# Patient Record
Sex: Female | Born: 1935
Health system: Southern US, Community
[De-identification: ages and names within clinical notes are randomized; demographics above are authoritative.]

## PROBLEM LIST (undated history)

## (undated) DIAGNOSIS — E079 Disorder of thyroid, unspecified: Secondary | ICD-10-CM

## (undated) DIAGNOSIS — Z87891 Personal history of nicotine dependence: Secondary | ICD-10-CM

## (undated) DIAGNOSIS — E7800 Pure hypercholesterolemia, unspecified: Secondary | ICD-10-CM

## (undated) DIAGNOSIS — J449 Chronic obstructive pulmonary disease, unspecified: Secondary | ICD-10-CM

## (undated) DIAGNOSIS — K449 Diaphragmatic hernia without obstruction or gangrene: Secondary | ICD-10-CM

## (undated) DIAGNOSIS — I10 Essential (primary) hypertension: Secondary | ICD-10-CM

## (undated) DIAGNOSIS — J45909 Unspecified asthma, uncomplicated: Secondary | ICD-10-CM

## (undated) DIAGNOSIS — K219 Gastro-esophageal reflux disease without esophagitis: Secondary | ICD-10-CM

## (undated) DIAGNOSIS — M1990 Unspecified osteoarthritis, unspecified site: Secondary | ICD-10-CM

## (undated) HISTORY — DX: Essential (primary) hypertension: I10

## (undated) HISTORY — DX: Diaphragmatic hernia without obstruction or gangrene: K44.9

## (undated) HISTORY — DX: Pure hypercholesterolemia, unspecified: E78.00

## (undated) HISTORY — PX: ABDOMINAL HYSTERECTOMY: SHX81

## (undated) HISTORY — DX: Chronic obstructive pulmonary disease, unspecified: J44.9

## (undated) HISTORY — DX: Personal history of nicotine dependence: Z87.891

## (undated) HISTORY — PX: CHOLECYSTECTOMY: SHX55

## (undated) HISTORY — DX: Unspecified osteoarthritis, unspecified site: M19.90

## (undated) HISTORY — DX: Disorder of thyroid, unspecified: E07.9

---

## 2000-04-30 ENCOUNTER — Encounter: Payer: Self-pay | Admitting: Family Medicine

## 2000-04-30 ENCOUNTER — Encounter: Admission: RE | Admit: 2000-04-30 | Discharge: 2000-04-30 | Payer: Self-pay | Admitting: Family Medicine

## 2001-06-24 HISTORY — PX: COLONOSCOPY: SHX174

## 2002-06-11 ENCOUNTER — Ambulatory Visit (HOSPITAL_COMMUNITY): Admission: RE | Admit: 2002-06-11 | Discharge: 2002-06-11 | Payer: Self-pay | Admitting: Gastroenterology

## 2002-06-11 ENCOUNTER — Encounter: Payer: Self-pay | Admitting: Gastroenterology

## 2002-07-16 ENCOUNTER — Encounter (INDEPENDENT_AMBULATORY_CARE_PROVIDER_SITE_OTHER): Payer: Self-pay | Admitting: Specialist

## 2002-07-16 ENCOUNTER — Encounter: Payer: Self-pay | Admitting: Surgery

## 2002-07-16 ENCOUNTER — Observation Stay (HOSPITAL_COMMUNITY): Admission: RE | Admit: 2002-07-16 | Discharge: 2002-07-17 | Payer: Self-pay | Admitting: Surgery

## 2002-09-17 ENCOUNTER — Ambulatory Visit (HOSPITAL_COMMUNITY): Admission: RE | Admit: 2002-09-17 | Discharge: 2002-09-17 | Payer: Self-pay | Admitting: Gastroenterology

## 2006-04-11 ENCOUNTER — Ambulatory Visit: Payer: Self-pay | Admitting: Family Medicine

## 2007-02-16 ENCOUNTER — Ambulatory Visit: Payer: Self-pay | Admitting: Family Medicine

## 2007-06-09 ENCOUNTER — Ambulatory Visit: Payer: Self-pay | Admitting: Family Medicine

## 2007-06-24 ENCOUNTER — Ambulatory Visit: Payer: Self-pay | Admitting: Family Medicine

## 2007-07-15 ENCOUNTER — Ambulatory Visit: Payer: Self-pay | Admitting: Family Medicine

## 2007-08-10 ENCOUNTER — Ambulatory Visit: Payer: Self-pay | Admitting: Family Medicine

## 2007-08-13 ENCOUNTER — Ambulatory Visit: Payer: Self-pay | Admitting: Family Medicine

## 2007-11-26 ENCOUNTER — Ambulatory Visit: Payer: Self-pay | Admitting: Family Medicine

## 2007-12-10 ENCOUNTER — Ambulatory Visit: Payer: Self-pay | Admitting: Family Medicine

## 2007-12-14 LAB — HM MAMMOGRAPHY: HM Mammogram: NEGATIVE

## 2008-01-25 ENCOUNTER — Ambulatory Visit: Payer: Self-pay | Admitting: Family Medicine

## 2008-03-01 ENCOUNTER — Ambulatory Visit: Payer: Self-pay | Admitting: Family Medicine

## 2008-11-29 ENCOUNTER — Ambulatory Visit: Payer: Self-pay | Admitting: Family Medicine

## 2009-01-31 ENCOUNTER — Ambulatory Visit: Payer: Self-pay | Admitting: Family Medicine

## 2009-11-30 ENCOUNTER — Ambulatory Visit: Payer: Self-pay | Admitting: Family Medicine

## 2010-02-15 ENCOUNTER — Ambulatory Visit: Payer: Self-pay | Admitting: Family Medicine

## 2010-03-07 ENCOUNTER — Ambulatory Visit: Payer: Self-pay | Admitting: Family Medicine

## 2010-03-28 ENCOUNTER — Ambulatory Visit: Payer: Self-pay | Admitting: Family Medicine

## 2010-06-21 ENCOUNTER — Inpatient Hospital Stay (HOSPITAL_COMMUNITY)
Admission: EM | Admit: 2010-06-21 | Discharge: 2010-06-29 | Payer: Self-pay | Source: Home / Self Care | Attending: Internal Medicine | Admitting: Internal Medicine

## 2010-06-22 ENCOUNTER — Encounter (INDEPENDENT_AMBULATORY_CARE_PROVIDER_SITE_OTHER): Payer: Self-pay | Admitting: Internal Medicine

## 2010-06-27 LAB — GLUCOSE, CAPILLARY
Glucose-Capillary: 98 mg/dL (ref 70–99)
Glucose-Capillary: 112 mg/dL — ABNORMAL HIGH (ref 70–99)
Glucose-Capillary: 107 mg/dL — ABNORMAL HIGH (ref 70–99)
Glucose-Capillary: 124 mg/dL — ABNORMAL HIGH (ref 70–99)

## 2010-06-27 LAB — CBC
HCT: 30.3 % — ABNORMAL LOW (ref 36.0–46.0)
Hemoglobin: 10.1 g/dL — ABNORMAL LOW (ref 12.0–15.0)
MCH: 29.8 pg (ref 26.0–34.0)
MCHC: 33.3 g/dL (ref 30.0–36.0)
MCV: 89.4 fL (ref 78.0–100.0)
Platelets: 518 10*3/uL — ABNORMAL HIGH (ref 150–400)
RBC: 3.39 MIL/uL — ABNORMAL LOW (ref 3.87–5.11)
RDW: 14.6 % (ref 11.5–15.5)
WBC: 11.5 10*3/uL — ABNORMAL HIGH (ref 4.0–10.5)

## 2010-06-27 LAB — MAGNESIUM: Magnesium: 1.5 mg/dL (ref 1.5–2.5)

## 2010-06-27 LAB — BASIC METABOLIC PANEL
BUN: 8 mg/dL (ref 6–23)
CO2: 25 mEq/L (ref 19–32)
Calcium: 7.3 mg/dL — ABNORMAL LOW (ref 8.4–10.5)
Chloride: 107 mEq/L (ref 96–112)
Creatinine, Ser: 0.63 mg/dL (ref 0.4–1.2)
GFR calc Af Amer: >60 mL/min (ref >60)
GFR calc non Af Amer: >60 mL/min (ref >60)
Glucose, Bld: 89 mg/dL (ref 70–99)
Potassium: 2.8 mEq/L — ABNORMAL LOW (ref 3.5–5.1)
Sodium: 140 mEq/L (ref 135–145)

## 2010-06-28 LAB — BASIC METABOLIC PANEL
BUN: 7 mg/dL (ref 6–23)
CO2: 29 mEq/L (ref 19–32)
Calcium: 8.5 mg/dL (ref 8.4–10.5)
Chloride: 104 mEq/L (ref 96–112)
Creatinine, Ser: 0.68 mg/dL (ref 0.4–1.2)
GFR calc Af Amer: >60 mL/min (ref >60)
GFR calc non Af Amer: >60 mL/min (ref >60)
Glucose, Bld: 97 mg/dL (ref 70–99)
Potassium: 4.1 mEq/L (ref 3.5–5.1)
Sodium: 139 mEq/L (ref 135–145)

## 2010-06-28 LAB — CBC
HCT: 29 % — ABNORMAL LOW (ref 36.0–46.0)
Hemoglobin: 9.6 g/dL — ABNORMAL LOW (ref 12.0–15.0)
MCH: 29.9 pg (ref 26.0–34.0)
MCHC: 33.1 g/dL (ref 30.0–36.0)
MCV: 90.3 fL (ref 78.0–100.0)
Platelets: 531 10*3/uL — ABNORMAL HIGH (ref 150–400)
RBC: 3.21 MIL/uL — ABNORMAL LOW (ref 3.87–5.11)
RDW: 14.4 % (ref 11.5–15.5)
WBC: 8.6 10*3/uL (ref 4.0–10.5)

## 2010-06-28 LAB — MAGNESIUM: Magnesium: 2.1 mg/dL (ref 1.5–2.5)

## 2010-06-28 LAB — GLUCOSE, CAPILLARY
Glucose-Capillary: 109 mg/dL — ABNORMAL HIGH (ref 70–99)
Glucose-Capillary: 125 mg/dL — ABNORMAL HIGH (ref 70–99)
Glucose-Capillary: 86 mg/dL (ref 70–99)
Glucose-Capillary: 107 mg/dL — ABNORMAL HIGH (ref 70–99)

## 2010-06-29 LAB — GLUCOSE, CAPILLARY
Glucose-Capillary: 88 mg/dL (ref 70–99)
Glucose-Capillary: 125 mg/dL — ABNORMAL HIGH (ref 70–99)

## 2010-07-04 ENCOUNTER — Ambulatory Visit
Admission: RE | Admit: 2010-07-04 | Discharge: 2010-07-04 | Payer: Self-pay | Source: Home / Self Care | Attending: Family Medicine | Admitting: Family Medicine

## 2010-07-09 LAB — GLUCOSE, CAPILLARY: Glucose-Capillary: 108 mg/dL — ABNORMAL HIGH (ref 70–99)

## 2010-07-10 ENCOUNTER — Ambulatory Visit
Admission: RE | Admit: 2010-07-10 | Discharge: 2010-07-10 | Payer: Self-pay | Source: Home / Self Care | Attending: Family Medicine | Admitting: Family Medicine

## 2010-07-19 LAB — CLOSTRIDIUM DIFFICILE BY PCR: Toxigenic C. Difficile by PCR: NEGATIVE

## 2010-07-20 ENCOUNTER — Ambulatory Visit
Admission: RE | Admit: 2010-07-20 | Discharge: 2010-07-20 | Payer: Self-pay | Source: Home / Self Care | Attending: Family Medicine | Admitting: Family Medicine

## 2010-09-03 LAB — COMPREHENSIVE METABOLIC PANEL
ALT: 18 U/L (ref 0–35)
ALT: 19 U/L (ref 0–35)
AST: 27 U/L (ref 0–37)
AST: 28 U/L (ref 0–37)
Albumin: 2 g/dL — ABNORMAL LOW (ref 3.5–5.2)
Albumin: 2.6 g/dL — ABNORMAL LOW (ref 3.5–5.2)
Alkaline Phosphatase: 62 U/L (ref 39–117)
BUN: 12 mg/dL (ref 6–23)
BUN: 36 mg/dL — ABNORMAL HIGH (ref 6–23)
BUN: 83 mg/dL — ABNORMAL HIGH (ref 6–23)
CO2: 18 mEq/L — ABNORMAL LOW (ref 19–32)
Calcium: 8.2 mg/dL — ABNORMAL LOW (ref 8.4–10.5)
Calcium: 8.5 mg/dL (ref 8.4–10.5)
Chloride: 108 mEq/L (ref 96–112)
Chloride: 110 mEq/L (ref 96–112)
Creatinine, Ser: 0.78 mg/dL (ref 0.4–1.2)
GFR calc Af Amer: 17 mL/min — ABNORMAL LOW (ref >60)
GFR calc non Af Amer: 14 mL/min — ABNORMAL LOW (ref >60)
GFR calc non Af Amer: 35 mL/min — ABNORMAL LOW (ref >60)
Glucose, Bld: 110 mg/dL — ABNORMAL HIGH (ref 70–99)
Glucose, Bld: 127 mg/dL — ABNORMAL HIGH (ref 70–99)
Potassium: 3.4 mEq/L — ABNORMAL LOW (ref 3.5–5.1)
Potassium: 3.7 mEq/L (ref 3.5–5.1)
Sodium: 139 mEq/L (ref 135–145)
Total Bilirubin: 0.5 mg/dL (ref 0.3–1.2)
Total Bilirubin: 0.6 mg/dL (ref 0.3–1.2)
Total Protein: 5.8 g/dL — ABNORMAL LOW (ref 6.0–8.3)
Total Protein: 7.1 g/dL (ref 6.0–8.3)

## 2010-09-03 LAB — GLUCOSE, CAPILLARY
Glucose-Capillary: 100 mg/dL — ABNORMAL HIGH (ref 70–99)
Glucose-Capillary: 101 mg/dL — ABNORMAL HIGH (ref 70–99)
Glucose-Capillary: 102 mg/dL — ABNORMAL HIGH (ref 70–99)
Glucose-Capillary: 107 mg/dL — ABNORMAL HIGH (ref 70–99)
Glucose-Capillary: 111 mg/dL — ABNORMAL HIGH (ref 70–99)
Glucose-Capillary: 111 mg/dL — ABNORMAL HIGH (ref 70–99)
Glucose-Capillary: 114 mg/dL — ABNORMAL HIGH (ref 70–99)
Glucose-Capillary: 116 mg/dL — ABNORMAL HIGH (ref 70–99)
Glucose-Capillary: 119 mg/dL — ABNORMAL HIGH (ref 70–99)
Glucose-Capillary: 121 mg/dL — ABNORMAL HIGH (ref 70–99)
Glucose-Capillary: 128 mg/dL — ABNORMAL HIGH (ref 70–99)
Glucose-Capillary: 137 mg/dL — ABNORMAL HIGH (ref 70–99)
Glucose-Capillary: 144 mg/dL — ABNORMAL HIGH (ref 70–99)
Glucose-Capillary: 152 mg/dL — ABNORMAL HIGH (ref 70–99)
Glucose-Capillary: 177 mg/dL — ABNORMAL HIGH (ref 70–99)

## 2010-09-03 LAB — CARBOXYHEMOGLOBIN
Methemoglobin: 1.1 % (ref 0.0–1.5)
Total hemoglobin: 11.1 g/dL — ABNORMAL LOW (ref 12.5–16.0)

## 2010-09-03 LAB — CBC
HCT: 27.7 % — ABNORMAL LOW (ref 36.0–46.0)
HCT: 33 % — ABNORMAL LOW (ref 36.0–46.0)
Hemoglobin: 10.3 g/dL — ABNORMAL LOW (ref 12.0–15.0)
Hemoglobin: 10.3 g/dL — ABNORMAL LOW (ref 12.0–15.0)
Hemoglobin: 10.8 g/dL — ABNORMAL LOW (ref 12.0–15.0)
MCH: 30.1 pg (ref 26.0–34.0)
MCH: 30.5 pg (ref 26.0–34.0)
MCH: 30.6 pg (ref 26.0–34.0)
MCH: 30.8 pg (ref 26.0–34.0)
MCHC: 34.7 g/dL (ref 30.0–36.0)
MCHC: 34.7 g/dL (ref 30.0–36.0)
MCHC: 34.8 g/dL (ref 30.0–36.0)
MCV: 86.8 fL (ref 78.0–100.0)
MCV: 88.2 fL (ref 78.0–100.0)
MCV: 88.9 fL (ref 78.0–100.0)
Platelets: 362 10*3/uL (ref 150–400)
Platelets: 388 10*3/uL (ref 150–400)
RBC: 3.38 MIL/uL — ABNORMAL LOW (ref 3.87–5.11)
RBC: 3.39 MIL/uL — ABNORMAL LOW (ref 3.87–5.11)
RBC: 3.51 MIL/uL — ABNORMAL LOW (ref 3.87–5.11)
RDW: 14.2 % (ref 11.5–15.5)
RDW: 14.6 % (ref 11.5–15.5)
RDW: 14.9 % (ref 11.5–15.5)
WBC: 15.2 10*3/uL — ABNORMAL HIGH (ref 4.0–10.5)
WBC: 17 10*3/uL — ABNORMAL HIGH (ref 4.0–10.5)

## 2010-09-03 LAB — BLOOD GAS, ARTERIAL
Acid-base deficit: 9.1 mmol/L — ABNORMAL HIGH (ref 0.0–2.0)
Bicarbonate: 15.4 mEq/L — ABNORMAL LOW (ref 20.0–24.0)
Drawn by: 257701
O2 Content: 2 L/min
Patient temperature: 100
pH, Arterial: 7.314 — ABNORMAL LOW (ref 7.350–7.400)

## 2010-09-03 LAB — DIFFERENTIAL
Basophils Absolute: 0 10*3/uL (ref 0.0–0.1)
Basophils Relative: 0 % (ref 0–1)
Eosinophils Absolute: 0 10*3/uL (ref 0.0–0.7)
Lymphocytes Relative: 6 % — ABNORMAL LOW (ref 12–46)
Lymphs Abs: 1 10*3/uL (ref 0.7–4.0)
Monocytes Absolute: 0.9 10*3/uL (ref 0.1–1.0)
Neutro Abs: 15.1 10*3/uL — ABNORMAL HIGH (ref 1.7–7.7)
Neutrophils Relative %: 89 % — ABNORMAL HIGH (ref 43–77)

## 2010-09-03 LAB — BASIC METABOLIC PANEL
CO2: 25 mEq/L (ref 19–32)
Calcium: 8.7 mg/dL (ref 8.4–10.5)
Chloride: 103 mEq/L (ref 96–112)
Creatinine, Ser: 0.7 mg/dL (ref 0.4–1.2)
Creatinine, Ser: 0.94 mg/dL (ref 0.4–1.2)
GFR calc Af Amer: >60 mL/min (ref >60)
GFR calc Af Amer: >60 mL/min (ref >60)
Sodium: 139 mEq/L (ref 135–145)
Sodium: 142 mEq/L (ref 135–145)

## 2010-09-03 LAB — MAGNESIUM: Magnesium: 2.1 mg/dL (ref 1.5–2.5)

## 2010-09-03 LAB — CULTURE, BLOOD (ROUTINE X 2)
Culture  Setup Time: 201112300316
Culture  Setup Time: 201112300317
Culture: NO GROWTH
Culture: NO GROWTH

## 2010-09-03 LAB — PHOSPHORUS: Phosphorus: 2.5 mg/dL (ref 2.3–4.6)

## 2010-09-03 LAB — CK TOTAL AND CKMB (NOT AT ARMC)
CK, MB: 2.3 ng/mL (ref 0.3–4.0)
Relative Index: INVALID (ref 0.0–2.5)
Total CK: 97 U/L (ref 7–177)

## 2010-09-03 LAB — CROSSMATCH: Unit division: 0

## 2010-09-03 LAB — CARDIAC PANEL(CRET KIN+CKTOT+MB+TROPI)
CK, MB: 3.5 ng/mL (ref 0.3–4.0)
Troponin I: 0.02 ng/mL (ref 0.00–0.06)
Troponin I: 0.07 ng/mL — ABNORMAL HIGH (ref 0.00–0.06)

## 2010-09-03 LAB — URINALYSIS, ROUTINE W REFLEX MICROSCOPIC
Bilirubin Urine: NEGATIVE
Glucose, UA: NEGATIVE mg/dL
Protein, ur: 30 mg/dL — AB
Urobilinogen, UA: 0.2 mg/dL (ref 0.0–1.0)

## 2010-09-03 LAB — LIPID PANEL
HDL: 28 mg/dL — ABNORMAL LOW (ref >39)
Triglycerides: 175 mg/dL — ABNORMAL HIGH (ref <150)
VLDL: 35 mg/dL (ref 0–40)

## 2010-09-03 LAB — TROPONIN I: Troponin I: 0.02 ng/mL (ref 0.00–0.06)

## 2010-09-03 LAB — TSH: TSH: 9.873 u[IU]/mL — ABNORMAL HIGH (ref 0.350–4.500)

## 2010-09-03 LAB — BRAIN NATRIURETIC PEPTIDE: Pro B Natriuretic peptide (BNP): 40.2 pg/mL (ref 0.0–100.0)

## 2010-09-03 LAB — T4: T4, Total: 2.8 ug/dL — ABNORMAL LOW (ref 5.0–12.5)

## 2010-09-03 LAB — URINE MICROSCOPIC-ADD ON

## 2010-09-03 LAB — LACTIC ACID, PLASMA: Lactic Acid, Venous: 1.4 mmol/L (ref 0.5–2.2)

## 2010-09-03 LAB — T3: T3, Total: 52.4 ng/dl — ABNORMAL LOW (ref 80.0–204.0)

## 2010-09-03 LAB — D-DIMER, QUANTITATIVE: D-Dimer, Quant: 3.25 ug/mL-FEU — ABNORMAL HIGH (ref 0.00–0.48)

## 2010-09-03 LAB — PROCALCITONIN: Procalcitonin: 39.85 ng/mL

## 2010-09-03 LAB — URINE CULTURE
Colony Count: NO GROWTH
Culture: NO GROWTH

## 2010-09-03 LAB — ABO/RH: ABO/RH(D): A POS

## 2010-09-05 ENCOUNTER — Ambulatory Visit (INDEPENDENT_AMBULATORY_CARE_PROVIDER_SITE_OTHER): Payer: PRIVATE HEALTH INSURANCE

## 2010-09-05 DIAGNOSIS — R947 Abnormal results of other endocrine function studies: Secondary | ICD-10-CM

## 2010-11-09 NOTE — Op Note (Signed)
NAME:  Laurie Ward, Laurie Ward                         ACCOUNT NO.:  [id number]   MEDICAL RECORD NO.:  [id number]                   PATIENT TYPE:  AMB   LOCATION:  DAY                                  FACILITY:  [facility]   PHYSICIAN:  Abigail Miyamoto, M.D.              DATE OF BIRTH:  03/17/1927   DATE OF PROCEDURE:  07/16/2002  DATE OF DISCHARGE:                                 OPERATIVE REPORT   PREOPERATIVE DIAGNOSIS:  Biliary dyskinesia.   POSTOPERATIVE DIAGNOSIS:  Biliary dyskinesia.   PROCEDURE:  Laparoscopic cholecystectomy with intraoperative cholangiogram.   SURGEON:  Abigail Miyamoto, M.D.   ANESTHESIA:  General endotracheal anesthesia.   ESTIMATED BLOOD LOSS:  Minimal.   ASSISTANT:  Vikki Ports, M.D.   FINDINGS:  The patient was found to have a normal cholangiogram.   DESCRIPTION OF PROCEDURE:  The patient was brought to the operating room,  identified as Laurie Ward. She was placed supine on the operating table  and general anesthesia was induced. Her abdomen was then prepped and draped  in the usual sterile fashion. Using a 15 blade, a small vertical incision  was made below the umbilicus. The incision was carried down to the fascia  which was then opened with a scalpel. A hemostat was then used to pass  through the peritoneal cavity. Next, a #0 Vicryl pursestring suture was  placed around the fascial opening. The Hasson port was placed through the  opening and insufflation of the abdomen was begun. Next, a 12 mm port was  placed in the patient's epigastrium and two 5 mm ports were placed in the  patient's right flank under direct vision. The gallbladder was then  identified and retracted above the liver bed. The gallbladder was  chronically scarred in appearance and quite floppy. The cystic duct was then  dissected out and clipped once distally. An Angiocatheter was inserted in  the right upper quadrant under direct vision. The Cholangiocatheter was then  passed through this. A small opening was then created in the cystic duct.  The Cholangiocatheter was then placed into the cystic duct. Cholangiogram  was then performed under direct fluoroscopy. Good flow of contrast was seen  into the entire biliary system and duodenum without any evidence of  abnormality. The cholangiocatheter was then removed under direct vision. The  cystic duct was then clipped several times proximally and transected with  the scissors. The cystic artery was then identified, clipped twice  proximally, once distally and transected with the scissors as well. The  gallbladder was then slowly dissected free from the liver bed with the  electrocautery. Hemostasis appeared to be achieved in the liver bed with the  electrocautery. Once the gallbladder was retrieved from the liver bed it was  removed through the incision at the umbilicus. The #0 Vicryl __________ and  was tacked in place, closing the fascial defect. The liver bed was again  examined  and hemostasis was achieved. All ports were then removed under  direct vision and the abdomen was deflated. All incisions were then  anesthetized with 0.25% Marcaine and then closed with 4-0  Monocryl subcuticular sutures. Steri-Strips, gauze and tape were then  applied. The patient tolerated the procedure well. All sponge, needle and  instrument counts were correct at the end of the procedure. The patient was  then extubated in the operating room and taken in stable condition to the  recovery room.                                               Abigail Miyamoto, M.D.    DB/MEDQ  D:  07/16/2002  T:  07/16/2002  Job:  [id number]   cc:   Anselmo Rod, M.D.  [address]  [address]  [place]  [place] [address]  Fax: [phone]

## 2010-11-09 NOTE — Op Note (Signed)
   NAME:  Laurie Ward, Laurie Ward                         ACCOUNT NO.:  [id number]   MEDICAL RECORD NO.:  [id number]                   PATIENT TYPE:  AMB   LOCATION:  ENDO                                 FACILITY:  [facility]   PHYSICIAN:  Anselmo Rod, M.D.               DATE OF BIRTH:  1927-05-05   DATE OF PROCEDURE:  09/17/2002  DATE OF DISCHARGE:  09/17/2002                                 OPERATIVE REPORT   PROCEDURE PERFORMED:  Screening colonoscopy.   ENDOSCOPIST:  Anselmo Rod, M.D.   INSTRUMENT USED:  Olympus video colonoscope.   INDICATIONS FOR PROCEDURE:  This 77 year old white female underwent  screening colonoscopy to rule out colonic polyps, masses, etc.   PREPROCEDURE PREPARATION:  Informed consent was procured from the patient.  The patient was fasted for eight hours prior to the procedure and prepped  with a bottle of magnesium citrate and a gallon of GoLYTELY the night prior  to the procedure.   PREPROCEDURE PHYSICAL:  VITAL SIGNS:  The patient had stable vital signs.  NECK:  Supple.  CHEST:  Clear to auscultation.  S1 and S2 regular.  ABDOMEN:  Soft with normal bowel sounds.   DESCRIPTION OF THE PROCEDURE:  The patient was placed in the left lateral  decubitus position, sedated with 50 mg of Demerol and 7.5 mg of Versed  intravenously.  Once the patient was adequately sedated and maintained on  low-flow oxygen and continuous cardiac monitoring, the Olympus video  colonoscope was advanced from the rectum to the cecum without difficulty.  The patient had a fairly good prep.  No masses, polyps, erosions,  ulcerations, or diverticula were seen.  The procedure was completed up to  the cecum, and the appendicular orifice and ileocecal valve were clearly  visualized and photographed.  Retroflexion in the rectum revealed no  abnormalities.   IMPRESSION:  Normal colonoscopy up to the cecum.   RECOMMENDATIONS:  1. Repeat colorectal cancer screening is recommended in the  next five years     unless the patient develops any abnormal symptoms in the interim.  2. A high-fiber diet has been discussed with the patient in great detail,     and __________ .  3. Outpatient followup on a p.r.n. basis.                                               Anselmo Rod, M.D.    JNM/MEDQ  D:  09/17/2002  T:  09/20/2002  Job:  [id number]

## 2010-12-04 ENCOUNTER — Other Ambulatory Visit: Payer: Self-pay | Admitting: Family Medicine

## 2010-12-05 ENCOUNTER — Other Ambulatory Visit: Payer: Self-pay | Admitting: Family Medicine

## 2010-12-18 ENCOUNTER — Other Ambulatory Visit: Payer: Self-pay | Admitting: Family Medicine

## 2011-01-18 ENCOUNTER — Other Ambulatory Visit: Payer: Self-pay | Admitting: Family Medicine

## 2011-02-07 ENCOUNTER — Encounter: Payer: Self-pay | Admitting: Family Medicine

## 2011-02-07 ENCOUNTER — Ambulatory Visit (INDEPENDENT_AMBULATORY_CARE_PROVIDER_SITE_OTHER): Payer: Medicare Other | Admitting: Family Medicine

## 2011-02-07 VITALS — BP 126/80 | HR 73 | Temp 98.1°F | Wt 152.0 lb

## 2011-02-07 DIAGNOSIS — M542 Cervicalgia: Secondary | ICD-10-CM

## 2011-02-07 NOTE — Patient Instructions (Signed)
Heat for 20 minutes 3 times per day. Do some gentle stretching after the heat. Ibuprofen 800 mg 3 times a day. If you're not better in 10 days come back

## 2011-02-07 NOTE — Progress Notes (Signed)
  Subjective:    Patient ID: Laurie Ward, female    DOB: 03-01-1927, 77 y.o.   MRN: [id number]  HPI 2 days ago she noted some upper act pain that progressed into her neck; she is also feeling into both ears with some discomfort. The pain is made worse with motion of her neck. No numbness, tingling or weakness in her arms, sore throat, cough or congestion. She has tried Tylenol   Review of Systems     Objective:   Physical Exam alert and in no distress. Tympanic membranes and canals are normal. Throat is clear. Tonsils are normal. Neck is supple without adenopathy or thyromegaly. Cardiac exam shows a regular sinus rhythm without murmurs or gallops. Lungs are clear to auscultation. Normal sensory motor and DTRs of her arms        Assessment & Plan:  Neck pain, etiology unclear. Conservative care including heat, stretching and Advil. Return here if symptoms continue.

## 2011-02-19 ENCOUNTER — Other Ambulatory Visit: Payer: Self-pay | Admitting: Family Medicine

## 2011-03-22 ENCOUNTER — Other Ambulatory Visit: Payer: Self-pay | Admitting: Family Medicine

## 2011-04-07 ENCOUNTER — Other Ambulatory Visit: Payer: Self-pay | Admitting: Family Medicine

## 2011-04-25 ENCOUNTER — Other Ambulatory Visit: Payer: Self-pay | Admitting: Family Medicine

## 2011-04-25 NOTE — Telephone Encounter (Signed)
Have her come in for a medication check appointment within the next month.

## 2011-04-25 NOTE — Telephone Encounter (Signed)
Is this okay?

## 2011-04-25 NOTE — Telephone Encounter (Signed)
Set up an appt for patient next Friday at 3:30 for pt to come in for med check

## 2011-05-03 ENCOUNTER — Encounter: Payer: Self-pay | Admitting: Family Medicine

## 2011-05-03 ENCOUNTER — Other Ambulatory Visit: Payer: Self-pay | Admitting: Family Medicine

## 2011-05-03 ENCOUNTER — Ambulatory Visit (INDEPENDENT_AMBULATORY_CARE_PROVIDER_SITE_OTHER): Payer: Medicare Other | Admitting: Family Medicine

## 2011-05-03 DIAGNOSIS — J449 Chronic obstructive pulmonary disease, unspecified: Secondary | ICD-10-CM

## 2011-05-03 DIAGNOSIS — E039 Hypothyroidism, unspecified: Secondary | ICD-10-CM | POA: Insufficient documentation

## 2011-05-03 DIAGNOSIS — M949 Disorder of cartilage, unspecified: Secondary | ICD-10-CM

## 2011-05-03 DIAGNOSIS — I10 Essential (primary) hypertension: Secondary | ICD-10-CM

## 2011-05-03 DIAGNOSIS — M8580 Other specified disorders of bone density and structure, unspecified site: Secondary | ICD-10-CM

## 2011-05-03 NOTE — Progress Notes (Signed)
  Subjective:    Patient ID: Laurie Ward, female    DOB: 09-19-1926, 77 y.o.   MRN: [id number]  HPI She is here for medication check. He will be getting involved in a research project for her underlying COPD. She continues on medications listed in the chart. Apparently she does not need any of these renewed. She does have some lesions in the palm of her left hand she would like evaluated.   Review of Systems     Objective:   Physical Exam alert and in no distress. Tympanic membranes and canals are normal. Throat is clear. Tonsils are normal. Neck is supple without adenopathy or thyromegaly. Cardiac exam shows a regular sinus rhythm without murmurs or gallops. Lungs are clear to auscultation. Exam of her left hand does show 21 cm round smooth non-mobile lesions that do not seem to be attached to the tendon       Assessment & Plan:   1. Osteopenia  HM DEXA SCAN  2. COPD (chronic obstructive pulmonary disease)    3. Hypertension    4. Hypothyroid     benign palmar lesions probably cysts. She was told that if this gives her more difficulty, I will refer her to orthopedics. Recommend she return here in several months for recheck and to do blood work. She did not want any blood work done today.

## 2011-05-13 ENCOUNTER — Ambulatory Visit
Admission: RE | Admit: 2011-05-13 | Discharge: 2011-05-13 | Disposition: A | Discharge status: Home / Self Care | Payer: Medicare Other | Source: Ambulatory Visit | Attending: Family Medicine | Admitting: Family Medicine

## 2011-05-13 DIAGNOSIS — M8580 Other specified disorders of bone density and structure, unspecified site: Secondary | ICD-10-CM

## 2011-05-13 LAB — HM DEXA SCAN

## 2011-05-22 ENCOUNTER — Ambulatory Visit (INDEPENDENT_AMBULATORY_CARE_PROVIDER_SITE_OTHER): Payer: Medicare Other | Admitting: Family Medicine

## 2011-05-22 ENCOUNTER — Encounter: Payer: Self-pay | Admitting: Family Medicine

## 2011-05-22 DIAGNOSIS — M810 Age-related osteoporosis without current pathological fracture: Secondary | ICD-10-CM

## 2011-05-22 MED ORDER — ALENDRONATE SODIUM 70 MG PO TABS: 70.0000 mg | ORAL_TABLET | ORAL | Status: DC

## 2011-05-22 NOTE — Progress Notes (Signed)
  Subjective:    Patient ID: Laurie Ward, female    DOB: 1926/10/20, 77 y.o.   MRN: [id number]  HPI She is here for consult concerning recent DEXA scan which did show osteoporosis. She has a previous history of osteopenia and history of smoking. She did stop smoking in 1993.   Review of Systems     Objective:   Physical Exam Alert and in no distress otherwise not examined       Assessment & Plan:  Osteoporosis. I will place her on Fosamax. I discussed the use of the medication and possible problems from this. I will also check a vitamin D level on her and recommended a multivitamin with extra calcium.

## 2011-05-24 ENCOUNTER — Other Ambulatory Visit: Payer: Self-pay | Admitting: Family Medicine

## 2011-05-27 ENCOUNTER — Telehealth: Payer: Self-pay

## 2011-05-27 NOTE — Telephone Encounter (Signed)
Called pt to come in for appt but she had already been here for the dexa scan

## 2011-06-26 ENCOUNTER — Other Ambulatory Visit: Payer: Self-pay | Admitting: Family Medicine

## 2011-06-27 ENCOUNTER — Other Ambulatory Visit: Payer: Self-pay | Admitting: Family Medicine

## 2011-06-29 ENCOUNTER — Other Ambulatory Visit: Payer: Self-pay | Admitting: Family Medicine

## 2011-07-05 ENCOUNTER — Ambulatory Visit (INDEPENDENT_AMBULATORY_CARE_PROVIDER_SITE_OTHER): Payer: Medicare Other | Admitting: Family Medicine

## 2011-07-05 ENCOUNTER — Encounter: Payer: Self-pay | Admitting: Family Medicine

## 2011-07-05 DIAGNOSIS — M810 Age-related osteoporosis without current pathological fracture: Secondary | ICD-10-CM

## 2011-07-05 MED ORDER — RISEDRONATE SODIUM 150 MG PO TABS: 150.0000 mg | ORAL_TABLET | ORAL | Status: DC

## 2011-07-05 NOTE — Patient Instructions (Signed)
Call me the first of the week and let me know how you did on the new pill

## 2011-07-05 NOTE — Progress Notes (Signed)
  Subjective:    Patient ID: Laurie Ward, female    DOB: 12-Jan-1927, 78 y.o.   MRN: [id number]  HPI She is here for recheck. She states that every time she takes the Fosamax, she gets left hip pain it can last as long as a week. She has had no other problems with this medicine.   Review of Systems     Objective:   Physical Exam Alert and in no distress otherwise not examined       Assessment & Plan:  Possible adverse reaction from Fosamax with underlying osteoporosis I will switch her to Actonel 150 mg and see how she tolerates this. She is to call me in one week

## 2011-07-30 ENCOUNTER — Other Ambulatory Visit: Payer: Self-pay | Admitting: Family Medicine

## 2011-08-07 ENCOUNTER — Other Ambulatory Visit: Payer: Self-pay | Admitting: Family Medicine

## 2011-10-04 ENCOUNTER — Other Ambulatory Visit: Payer: Self-pay | Admitting: Family Medicine

## 2011-10-08 ENCOUNTER — Ambulatory Visit (INDEPENDENT_AMBULATORY_CARE_PROVIDER_SITE_OTHER): Payer: Medicare Other | Admitting: Medical

## 2011-10-08 ENCOUNTER — Encounter: Payer: Self-pay | Admitting: Medical

## 2011-10-08 VITALS — BP 108/70 | HR 76 | Temp 98.1°F | Resp 16 | Wt 157.0 lb

## 2011-10-08 DIAGNOSIS — J40 Bronchitis, not specified as acute or chronic: Secondary | ICD-10-CM

## 2011-10-08 MED ORDER — AMOXICILLIN-POT CLAVULANATE 875-125 MG PO TABS: 1.0000 | ORAL_TABLET | Freq: Two times a day (BID) | ORAL | Status: AC

## 2011-10-08 NOTE — Progress Notes (Signed)
Subjective:  Laurie Ward is a 78 y.o. female who presents for 1 week hx/o illness. Feels like bad cold symptoms.  Throat hurts, tickle in throat, bad head congestion, cough keeping her up at night, coughing all day too.  Denies fever, some chills, no NVD.  Has had sick contacts at church.   Using some OTC cough medication, Tussin and Mucinex.  No other aggravating or relieving factors.  Hasn't needed her inhaler.  Former smoker, quite 30 years ago.  No other c/o.  Past Medical History  Diagnosis Date  . Arthritis   . Hypertension   . COPD (chronic obstructive pulmonary disease)   . Thyroid disease     HYPOTHYROID  . Osteoporosis     OSTEOPENIA  . Former smoker   . HH (hiatus hernia)    Review of Systems Constitutional: -fever, -chills, -sweats, -unexpected -weight change,-fatigue ENT: +runny nose, -ear pain, +sore throat, burning throat Cardiology:  -chest pain, -palpitations, -edema Respiratory: +cough, -shortness of breath, +wheezing Gastroenterology: -abdominal pain, -nausea, -vomiting, -diarrhea, -constipation Hematology: -bleeding or bruising problems   Objective:   Filed Vitals:   10/08/11 1021  BP: 108/70  Pulse: 76  Temp: 98.1 F (36.7 C)  Resp: 16    General appearance: Alert, WD/WN, no distress, somewhat ill appearing                             Skin: warm, no rash                           Head: no sinus tenderness                            Eyes: conjunctiva normal, corneas clear, PERRLA                            Ears: pearly TMs, external ear canals normal                          Nose: septum midline, nares patent, no discharge             Mouth/throat: MMM, tongue normal, mild pharyngeal erythema                           Neck: supple, no adenopathy, no thyromegaly, nontender                          Heart: RRR, normal S1, S2, no murmurs                         Lungs: decreased breath sounds, slightly dull to percussion right lower fields, no wheezes,  rales, or rhonchi     Assessment and Plan:   Encounter Diagnosis  Name Primary?  . Bronchitis Yes   Discussed diagnosis and treatment.  Begin Augmentin.  Suggested Mucinex DM, rest, hydrate well, and if not improving or if worse in 3-4 days, call or return.

## 2011-10-08 NOTE — Patient Instructions (Signed)
Drink plenty of water, rest, begin Augmentin antibiotic twice daily for 10 days, and consider Mucinex DM OTC.    If worse or not improving by end of the week, call or return; especially if shortness of breath.

## 2011-11-05 ENCOUNTER — Other Ambulatory Visit: Payer: Self-pay | Admitting: Family Medicine

## 2011-12-06 ENCOUNTER — Other Ambulatory Visit: Payer: Self-pay | Admitting: Family Medicine

## 2012-01-07 ENCOUNTER — Other Ambulatory Visit: Payer: Self-pay | Admitting: Family Medicine

## 2012-01-08 NOTE — Telephone Encounter (Signed)
Her medications were renewed but call her for an appointment

## 2012-01-08 NOTE — Telephone Encounter (Signed)
Her meds were renewed but she needs an appointment

## 2012-01-08 NOTE — Telephone Encounter (Signed)
Wasn't sure if this was ok to fill?

## 2012-02-07 ENCOUNTER — Other Ambulatory Visit: Payer: Self-pay | Admitting: Family Medicine

## 2012-02-07 NOTE — Telephone Encounter (Signed)
Patient needs to schedule an office visit

## 2012-03-10 ENCOUNTER — Other Ambulatory Visit: Payer: Self-pay | Admitting: Family Medicine

## 2012-04-07 ENCOUNTER — Ambulatory Visit (INDEPENDENT_AMBULATORY_CARE_PROVIDER_SITE_OTHER): Payer: Medicare Other | Admitting: Family Medicine

## 2012-04-07 ENCOUNTER — Encounter: Payer: Self-pay | Admitting: Family Medicine

## 2012-04-07 VITALS — BP 118/70 | HR 70 | Wt 151.0 lb

## 2012-04-07 DIAGNOSIS — E039 Hypothyroidism, unspecified: Secondary | ICD-10-CM

## 2012-04-07 DIAGNOSIS — M810 Age-related osteoporosis without current pathological fracture: Secondary | ICD-10-CM

## 2012-04-07 DIAGNOSIS — E785 Hyperlipidemia, unspecified: Secondary | ICD-10-CM

## 2012-04-07 DIAGNOSIS — J449 Chronic obstructive pulmonary disease, unspecified: Secondary | ICD-10-CM

## 2012-04-07 DIAGNOSIS — Z79899 Other long term (current) drug therapy: Secondary | ICD-10-CM

## 2012-04-07 DIAGNOSIS — K219 Gastro-esophageal reflux disease without esophagitis: Secondary | ICD-10-CM

## 2012-04-07 DIAGNOSIS — I10 Essential (primary) hypertension: Secondary | ICD-10-CM

## 2012-04-07 LAB — CBC WITH DIFFERENTIAL/PLATELET
Eosinophils Relative: 3 % (ref 0–5)
HCT: 38.3 % (ref 36.0–46.0)
Hemoglobin: 13.4 g/dL (ref 12.0–15.0)
Lymphocytes Relative: 34 % (ref 12–46)
Lymphs Abs: 2.9 10*3/uL (ref 0.7–4.0)
MCH: 30.2 pg (ref 26.0–34.0)
MCV: 86.5 fL (ref 78.0–100.0)
Monocytes Relative: 9 % (ref 3–12)
Platelets: 382 10*3/uL (ref 150–400)
RBC: 4.43 MIL/uL (ref 3.87–5.11)
WBC: 8.5 10*3/uL (ref 4.0–10.5)

## 2012-04-07 MED ORDER — OMEPRAZOLE 40 MG PO CPDR: 40.0000 mg | DELAYED_RELEASE_CAPSULE | Freq: Every day | ORAL | Status: DC

## 2012-04-07 MED ORDER — SIMVASTATIN 40 MG PO TABS: 40.0000 mg | ORAL_TABLET | ORAL | Status: DC

## 2012-04-07 MED ORDER — BECLOMETHASONE DIPROPIONATE 80 MCG/ACT IN AERS: 1.0000 | INHALATION_SPRAY | RESPIRATORY_TRACT | Status: DC

## 2012-04-07 MED ORDER — OLMESARTAN MEDOXOMIL-HCTZ 20-12.5 MG PO TABS: 1.0000 | ORAL_TABLET | Freq: Every day | ORAL | Status: DC

## 2012-04-07 NOTE — Patient Instructions (Signed)
Stop the hydrochlorothiazide. Start taking the omeprazole every other day and if your symptoms are still under control switch to every 3 days.

## 2012-04-07 NOTE — Progress Notes (Signed)
  Subjective:    Patient ID: MIKEL PYON, female    DOB: 1926-09-10, 78 y.o.   MRN: [id number]  HPI He is here for medication check. She was taking Actonel but switched back to Fosamax and she did not notice any difference. She was also seen in an urgent care center in June and given Qvar. This has greatly helped her breathing such that she rarely uses her rescue inhaler. She continues on her thyroid medicine as well as cholesterol meds and is having no difficulty with them. She does use Prilosec on a daily basis but has never tried to alter her dosing regimen. She did get a flu shot this year.  Review of Systems     Objective:   Physical Exam alert and in no distress. Tympanic membranes and canals are normal. Throat is clear. Tonsils are normal. Neck is supple without adenopathy or thyromegaly. Cardiac exam shows a regular sinus rhythm without murmurs or gallops. Lungs are clear to auscultation. DTRs are normal       Assessment & Plan:   1. Osteoporosis    2. Hypothyroid  TSH  3. Hypertension  olmesartan-hydrochlorothiazide (BENICAR HCT) 20-12.5 MG per tablet, CBC with Differential, Comprehensive metabolic panel  4. COPD (chronic obstructive pulmonary disease)  beclomethasone (QVAR) 80 MCG/ACT inhaler  5. GERD (gastroesophageal reflux disease)  omeprazole (PRILOSEC) 40 MG capsule  6. Hyperlipidemia LDL goal < 100  simvastatin (ZOCOR) 40 MG tablet, Lipid Panel  7. Encounter for long-term (current) use of other medications  CBC with Differential, Lipid Panel, Comprehensive metabolic panel   recommend she try to take the Prilosec on an every other day basis. Her meds were renewed. I will renew her thyroid pending blood work.

## 2012-04-08 LAB — TSH: TSH: 2.549 u[IU]/mL (ref 0.350–4.500)

## 2012-04-08 LAB — COMPREHENSIVE METABOLIC PANEL
ALT: 10 U/L (ref 0–35)
Albumin: 4.7 g/dL (ref 3.5–5.2)
Alkaline Phosphatase: 43 U/L (ref 39–117)
Glucose, Bld: 97 mg/dL (ref 70–99)
Potassium: 4.1 mEq/L (ref 3.5–5.3)
Sodium: 136 mEq/L (ref 135–145)
Total Bilirubin: 0.4 mg/dL (ref 0.3–1.2)
Total Protein: 7.6 g/dL (ref 6.0–8.3)

## 2012-04-08 LAB — LIPID PANEL
HDL: 38 mg/dL — ABNORMAL LOW (ref >39)
Total CHOL/HDL Ratio: 5.6 Ratio
VLDL: 37 mg/dL (ref 0–40)

## 2012-04-08 MED ORDER — LEVOTHYROXINE SODIUM 75 MCG PO TABS: 75.0000 ug | ORAL_TABLET | Freq: Every day | ORAL | Status: DC

## 2012-04-08 NOTE — Addendum Note (Signed)
Addended by: Ronnald Nian on: 04/08/2012 09:48 AM   Modules accepted: Orders

## 2012-04-08 NOTE — Progress Notes (Signed)
Quick Note:  Let her know that the blood work looks good. Tell her that I called in the thyroid medication for her ______

## 2012-04-10 ENCOUNTER — Telehealth: Payer: Self-pay

## 2012-04-10 NOTE — Telephone Encounter (Signed)
Pharmacy called to say we had filled pt Benicar wrong but come to find out pharmacy filled pt med wrong in sept not what we ordered pharmacist ask we note it in the system instead of Benicar they gave her benazepril

## 2012-05-27 ENCOUNTER — Other Ambulatory Visit: Payer: Self-pay | Admitting: Family Medicine

## 2012-07-11 ENCOUNTER — Other Ambulatory Visit: Payer: Self-pay | Admitting: Family Medicine

## 2012-08-30 ENCOUNTER — Other Ambulatory Visit: Payer: Self-pay | Admitting: Family Medicine

## 2012-08-31 NOTE — Telephone Encounter (Signed)
Is this okay to fill? 

## 2012-10-12 ENCOUNTER — Encounter: Payer: Self-pay | Admitting: Family Medicine

## 2012-10-12 ENCOUNTER — Ambulatory Visit (INDEPENDENT_AMBULATORY_CARE_PROVIDER_SITE_OTHER): Payer: Medicare Other | Admitting: Family Medicine

## 2012-10-12 VITALS — BP 120/70 | HR 60 | Wt 156.0 lb

## 2012-10-12 DIAGNOSIS — N390 Urinary tract infection, site not specified: Secondary | ICD-10-CM

## 2012-10-12 DIAGNOSIS — I10 Essential (primary) hypertension: Secondary | ICD-10-CM

## 2012-10-12 LAB — POCT URINALYSIS DIPSTICK
Ketones, UA: NEGATIVE
Spec Grav, UA: 1.015
Urobilinogen, UA: NEGATIVE
pH, UA: 7

## 2012-10-12 MED ORDER — LISINOPRIL-HYDROCHLOROTHIAZIDE 10-12.5 MG PO TABS: 1.0000 | ORAL_TABLET | Freq: Every day | ORAL | Status: DC

## 2012-10-12 MED ORDER — SULFAMETHOXAZOLE-TRIMETHOPRIM 800-160 MG PO TABS: 1.0000 | ORAL_TABLET | Freq: Two times a day (BID) | ORAL | Status: DC

## 2012-10-12 NOTE — Progress Notes (Signed)
  Subjective:    Patient ID: Laurie Ward, female    DOB: 1926-08-22, 79 y.o.   MRN: [id number]  HPI He is here for evaluation of urinary frequency and incomplete emptying. She relates having symptoms intermittently over the last 8 months and usually treating this with Azo-Standard. No fever, chills, abdominal or flank pain. She has not sought care for this during that timeframe.she would also like to be switched to a different blood pressure medication since 1 she is on now is expensive. She has no history of being on any other medication.   Review of Systems     Objective:   Physical Exam Alert and in no distress. No CVA tenderness noted. Urinalysis microscopic was positive for red cells bacteria. Dipstick is recorded       Assessment & Plan:  UTI (urinary tract infection) - Plan: sulfamethoxazole-trimethoprim (BACTRIM DS,SEPTRA DS) 800-160 MG per tablet, POCT urinalysis dipstick  Hypertension - Plan: lisinopril-hydrochlorothiazide (PRINZIDE,ZESTORETIC) 10-12.5 MG per tablet she is to return here in one month for recheck on her blood pressure. She will also keep track of any urinary symptoms and if she develops him, she is to return here for followup.

## 2012-10-12 NOTE — Patient Instructions (Signed)
Take the antibiotic until it is entirely gone. Make sure you drink plenty of fluids. If you have symptoms again, come on back in for recheck

## 2012-10-13 ENCOUNTER — Encounter (HOSPITAL_COMMUNITY): Payer: Self-pay | Admitting: *Deleted

## 2012-10-13 ENCOUNTER — Emergency Department (HOSPITAL_COMMUNITY)
Admission: EM | Admit: 2012-10-13 | Discharge: 2012-10-14 | Disposition: A | Discharge status: Home / Self Care | Payer: Medicare Other | Attending: Emergency Medicine | Admitting: Emergency Medicine

## 2012-10-13 DIAGNOSIS — J449 Chronic obstructive pulmonary disease, unspecified: Secondary | ICD-10-CM | POA: Insufficient documentation

## 2012-10-13 DIAGNOSIS — R52 Pain, unspecified: Secondary | ICD-10-CM | POA: Insufficient documentation

## 2012-10-13 DIAGNOSIS — Z79899 Other long term (current) drug therapy: Secondary | ICD-10-CM | POA: Insufficient documentation

## 2012-10-13 DIAGNOSIS — E079 Disorder of thyroid, unspecified: Secondary | ICD-10-CM | POA: Insufficient documentation

## 2012-10-13 DIAGNOSIS — N390 Urinary tract infection, site not specified: Secondary | ICD-10-CM

## 2012-10-13 DIAGNOSIS — I10 Essential (primary) hypertension: Secondary | ICD-10-CM | POA: Insufficient documentation

## 2012-10-13 DIAGNOSIS — IMO0001 Reserved for inherently not codable concepts without codable children: Secondary | ICD-10-CM | POA: Insufficient documentation

## 2012-10-13 DIAGNOSIS — J4489 Other specified chronic obstructive pulmonary disease: Secondary | ICD-10-CM | POA: Insufficient documentation

## 2012-10-13 DIAGNOSIS — R509 Fever, unspecified: Secondary | ICD-10-CM | POA: Insufficient documentation

## 2012-10-13 DIAGNOSIS — Z87891 Personal history of nicotine dependence: Secondary | ICD-10-CM | POA: Insufficient documentation

## 2012-10-13 DIAGNOSIS — K219 Gastro-esophageal reflux disease without esophagitis: Secondary | ICD-10-CM | POA: Insufficient documentation

## 2012-10-13 DIAGNOSIS — Z8739 Personal history of other diseases of the musculoskeletal system and connective tissue: Secondary | ICD-10-CM | POA: Insufficient documentation

## 2012-10-13 DIAGNOSIS — Z8719 Personal history of other diseases of the digestive system: Secondary | ICD-10-CM | POA: Insufficient documentation

## 2012-10-13 HISTORY — DX: Gastro-esophageal reflux disease without esophagitis: K21.9

## 2012-10-13 HISTORY — DX: Unspecified asthma, uncomplicated: J45.909

## 2012-10-13 LAB — COMPREHENSIVE METABOLIC PANEL
AST: 15 U/L (ref 0–37)
Albumin: 3.6 g/dL (ref 3.5–5.2)
Calcium: 9.8 mg/dL (ref 8.4–10.5)
Creatinine, Ser: 1.5 mg/dL — ABNORMAL HIGH (ref 0.50–1.10)
GFR calc non Af Amer: 31 mL/min — ABNORMAL LOW (ref >90)

## 2012-10-13 LAB — CBC WITH DIFFERENTIAL/PLATELET
Basophils Absolute: 0 10*3/uL (ref 0.0–0.1)
Basophils Relative: 0 % (ref 0–1)
Eosinophils Relative: 3 % (ref 0–5)
HCT: 37.8 % (ref 36.0–46.0)
MCHC: 33.3 g/dL (ref 30.0–36.0)
MCV: 88.3 fL (ref 78.0–100.0)
Monocytes Absolute: 0.6 10*3/uL (ref 0.1–1.0)
RDW: 13 % (ref 11.5–15.5)

## 2012-10-13 LAB — TROPONIN I: Troponin I: <0.3 ng/mL (ref <0.30)

## 2012-10-13 MED ORDER — ACETAMINOPHEN 325 MG PO TABS
650.0000 mg | ORAL_TABLET | Freq: Once | ORAL | Status: AC
  Administered 2012-10-13: 650 mg via ORAL
  Filled 2012-10-13: qty 2

## 2012-10-13 MED ORDER — ONDANSETRON 8 MG PO TBDP
8.0000 mg | ORAL_TABLET | Freq: Once | ORAL | Status: AC
  Administered 2012-10-13: 8 mg via ORAL
  Filled 2012-10-13: qty 1

## 2012-10-13 MED ORDER — ONDANSETRON HCL 4 MG/2ML IJ SOLN
4.0000 mg | Freq: Once | INTRAMUSCULAR | Status: AC
  Administered 2012-10-13: 4 mg via INTRAVENOUS
  Filled 2012-10-13: qty 2

## 2012-10-13 MED ORDER — SODIUM CHLORIDE 0.9 % IV BOLUS (SEPSIS)
500.0000 mL | Freq: Once | INTRAVENOUS | Status: AC
  Administered 2012-10-13: 500 mL via INTRAVENOUS

## 2012-10-13 NOTE — ED Notes (Signed)
Pt states that she is feeling weak and has generalized body aches; pt c/o nausea but no vomiting; pt states "I think I would feel better if I could vomit"; denies diarrhea; pt reports fever at home but known upon arrival;pt denies abdominal pain or cramping; pt states "I just feel awful"

## 2012-10-13 NOTE — ED Provider Notes (Signed)
History     CSN: [id number]  Arrival date & time 10/13/12  2012   First MD Initiated Contact with Patient 10/13/12 2143      Chief Complaint  Patient presents with  . Nausea  . Generalized Body Aches    (Consider location/radiation/quality/duration/timing/severity/associated sxs/prior treatment) HPI Comments: Ms. Laurie Ward is an 79 year old normally healthy, female, who was seen by her primary care physician yesterday and diagnosed with a UTI by urine dip, started on Septra, which she did not start taking until this morning.  She is taken 2 doses today, but is still feeling "bad."  She has generalized myalgias, nausea, without vomiting, or diarrhea.  His subjective fever.  The history is provided by the patient.    Past Medical History  Diagnosis Date  . Arthritis   . Hypertension   . COPD (chronic obstructive pulmonary disease)   . Thyroid disease     HYPOTHYROID  . Osteoporosis     OSTEOPENIA  . Former smoker   . HH (hiatus hernia)   . GERD (gastroesophageal reflux disease)   . Asthma     Past Surgical History  Procedure Laterality Date  . Abdominal hysterectomy    . Cholecystectomy      No family history on file.  History  Substance Use Topics  . Smoking status: Former Games developer  . Smokeless tobacco: Never Used  . Alcohol Use: No    OB History   Grav Para Term Preterm Abortions TAB SAB Ect Mult Living                  Review of Systems  Constitutional: Positive for fever. Negative for chills.  Respiratory: Negative for cough and shortness of breath.   Cardiovascular: Negative for chest pain and leg swelling.  Gastrointestinal: Positive for nausea. Negative for vomiting, abdominal pain, diarrhea and constipation.  Genitourinary: Negative for dysuria, frequency and hematuria.  Musculoskeletal: Positive for myalgias.  Skin: Negative for rash and wound.  Neurological: Negative for dizziness and weakness.  All other systems reviewed and are  negative.    Allergies  Review of patient's allergies indicates no known allergies.  Home Medications   Current Outpatient Rx  Name  Route  Sig  Dispense  Refill  . alendronate (FOSAMAX) 70 MG tablet   Oral   Take 70 mg by mouth every 7 (seven) days. Take with a full glass of water on an empty stomach. On thursdays         . beclomethasone (QVAR) 80 MCG/ACT inhaler   Inhalation   Inhale 1 puff into the lungs 1 day or 1 dose.   1 Inhaler   12   . levothyroxine (SYNTHROID, LEVOTHROID) 75 MCG tablet   Oral   Take 1 tablet (75 mcg total) by mouth daily.   30 tablet   PRN   . lisinopril-hydrochlorothiazide (PRINZIDE,ZESTORETIC) 10-12.5 MG per tablet   Oral   Take 1 tablet by mouth daily.   30 tablet   3   . omeprazole (PRILOSEC) 40 MG capsule   Oral   Take 1 capsule (40 mg total) by mouth daily.   30 capsule   11   . simvastatin (ZOCOR) 40 MG tablet   Oral   Take 40 mg by mouth every morning.         . sulfamethoxazole-trimethoprim (BACTRIM DS,SEPTRA DS) 800-160 MG per tablet   Oral   Take 1 tablet by mouth 2 (two) times daily.   20 tablet   0   .  ondansetron (ZOFRAN) 4 MG tablet   Oral   Take 1 tablet (4 mg total) by mouth every 6 (six) hours.   12 tablet   0     BP 100/74  Pulse 96  Temp(Src) 100.9 F (38.3 C) (Oral)  Resp 20  Ht 5\' 2"  (1.575 m)  Wt 156 lb (70.761 kg)  BMI 28.53 kg/m2  SpO2 99%  Physical Exam  Constitutional: She is oriented to person, place, and time. She appears well-developed and well-nourished.  HENT:  Head: Normocephalic and atraumatic.  Mouth/Throat: Oropharynx is clear and moist.  Eyes: Pupils are equal, round, and reactive to light.  Neck: Normal range of motion.  Cardiovascular: Normal rate and regular rhythm.   Pulmonary/Chest: Effort normal and breath sounds normal.  Abdominal: Soft. Bowel sounds are normal. She exhibits no distension. There is no tenderness.  Musculoskeletal: Normal range of motion. She  exhibits no edema.  Neurological: She is alert and oriented to person, place, and time.  Skin: Skin is warm and dry.  Her cheeks are flushed    ED Course  Procedures (including critical care time)  Labs Reviewed  CBC WITH DIFFERENTIAL - Abnormal; Notable for the following:    WBC 11.1 (*)    Neutrophils Relative 86 (*)    Neutro Abs 9.6 (*)    Lymphocytes Relative 6 (*)    Lymphs Abs 0.6 (*)    All other components within normal limits  COMPREHENSIVE METABOLIC PANEL - Abnormal; Notable for the following:    Glucose, Bld 130 (*)    Creatinine, Ser 1.50 (*)    GFR calc non Af Amer 31 (*)    GFR calc Af Amer 36 (*)    All other components within normal limits  LIPASE, BLOOD - Abnormal; Notable for the following:    Lipase 82 (*)    All other components within normal limits  URINALYSIS, ROUTINE W REFLEX MICROSCOPIC - Abnormal; Notable for the following:    APPearance CLOUDY (*)    Leukocytes, UA LARGE (*)    All other components within normal limits  URINE MICROSCOPIC-ADD ON - Abnormal; Notable for the following:    Squamous Epithelial / LPF FEW (*)    All other components within normal limits  TROPONIN I   No results found.   1. UTI (lower urinary tract infection)       MDM   Patient has been hydrated, given antiemetic, and is feeling much better.  She has been discharged home with instructions to continue taking the antibiotic and given a prescription for Zofran to control any Further episodes of nausea        Arman Filter, NP 10/14/12 0140

## 2012-10-14 LAB — URINALYSIS, ROUTINE W REFLEX MICROSCOPIC
Glucose, UA: NEGATIVE mg/dL
Protein, ur: NEGATIVE mg/dL
pH: 6 (ref 5.0–8.0)

## 2012-10-14 LAB — URINE MICROSCOPIC-ADD ON

## 2012-10-14 MED ORDER — ONDANSETRON HCL 4 MG PO TABS: 4.0000 mg | ORAL_TABLET | Freq: Four times a day (QID) | ORAL | Status: DC

## 2012-10-15 ENCOUNTER — Encounter (HOSPITAL_COMMUNITY): Payer: Self-pay | Admitting: *Deleted

## 2012-10-15 ENCOUNTER — Emergency Department (HOSPITAL_COMMUNITY): Payer: Medicare Other

## 2012-10-15 ENCOUNTER — Inpatient Hospital Stay (HOSPITAL_COMMUNITY)
Admission: EM | Admit: 2012-10-15 | Discharge: 2012-10-18 | DRG: 191 | Disposition: A | Discharge status: Home / Self Care | Payer: Medicare Other | Attending: Internal Medicine | Admitting: Internal Medicine

## 2012-10-15 DIAGNOSIS — J441 Chronic obstructive pulmonary disease with (acute) exacerbation: Principal | ICD-10-CM | POA: Diagnosis present

## 2012-10-15 DIAGNOSIS — K219 Gastro-esophageal reflux disease without esophagitis: Secondary | ICD-10-CM | POA: Diagnosis present

## 2012-10-15 DIAGNOSIS — Z87891 Personal history of nicotine dependence: Secondary | ICD-10-CM

## 2012-10-15 DIAGNOSIS — N189 Chronic kidney disease, unspecified: Secondary | ICD-10-CM | POA: Diagnosis present

## 2012-10-15 DIAGNOSIS — E039 Hypothyroidism, unspecified: Secondary | ICD-10-CM | POA: Diagnosis present

## 2012-10-15 DIAGNOSIS — N179 Acute kidney failure, unspecified: Secondary | ICD-10-CM | POA: Diagnosis present

## 2012-10-15 DIAGNOSIS — I10 Essential (primary) hypertension: Secondary | ICD-10-CM

## 2012-10-15 DIAGNOSIS — R21 Rash and other nonspecific skin eruption: Secondary | ICD-10-CM | POA: Diagnosis present

## 2012-10-15 DIAGNOSIS — J449 Chronic obstructive pulmonary disease, unspecified: Secondary | ICD-10-CM | POA: Diagnosis present

## 2012-10-15 DIAGNOSIS — M810 Age-related osteoporosis without current pathological fracture: Secondary | ICD-10-CM

## 2012-10-15 DIAGNOSIS — I959 Hypotension, unspecified: Secondary | ICD-10-CM | POA: Diagnosis present

## 2012-10-15 DIAGNOSIS — Z79899 Other long term (current) drug therapy: Secondary | ICD-10-CM

## 2012-10-15 DIAGNOSIS — J4489 Other specified chronic obstructive pulmonary disease: Secondary | ICD-10-CM | POA: Diagnosis present

## 2012-10-15 DIAGNOSIS — R5081 Fever presenting with conditions classified elsewhere: Secondary | ICD-10-CM | POA: Diagnosis present

## 2012-10-15 DIAGNOSIS — R509 Fever, unspecified: Secondary | ICD-10-CM | POA: Diagnosis present

## 2012-10-15 DIAGNOSIS — M899 Disorder of bone, unspecified: Secondary | ICD-10-CM | POA: Diagnosis present

## 2012-10-15 DIAGNOSIS — M949 Disorder of cartilage, unspecified: Secondary | ICD-10-CM | POA: Diagnosis present

## 2012-10-15 DIAGNOSIS — K5900 Constipation, unspecified: Secondary | ICD-10-CM | POA: Diagnosis present

## 2012-10-15 DIAGNOSIS — J45901 Unspecified asthma with (acute) exacerbation: Principal | ICD-10-CM | POA: Diagnosis present

## 2012-10-15 DIAGNOSIS — E871 Hypo-osmolality and hyponatremia: Secondary | ICD-10-CM | POA: Diagnosis present

## 2012-10-15 DIAGNOSIS — I129 Hypertensive chronic kidney disease with stage 1 through stage 4 chronic kidney disease, or unspecified chronic kidney disease: Secondary | ICD-10-CM | POA: Diagnosis present

## 2012-10-15 LAB — POCT I-STAT, CHEM 8
BUN: 14 mg/dL (ref 6–23)
Chloride: 100 mEq/L (ref 96–112)
Creatinine, Ser: 1.7 mg/dL — ABNORMAL HIGH (ref 0.50–1.10)
Glucose, Bld: 119 mg/dL — ABNORMAL HIGH (ref 70–99)
Hemoglobin: 11.9 g/dL — ABNORMAL LOW (ref 12.0–15.0)
Potassium: 4.1 mEq/L (ref 3.5–5.1)
Sodium: 132 mEq/L — ABNORMAL LOW (ref 135–145)

## 2012-10-15 LAB — CBC
HCT: 33.7 % — ABNORMAL LOW (ref 36.0–46.0)
MCH: 29.5 pg (ref 26.0–34.0)
MCHC: 34.1 g/dL (ref 30.0–36.0)
MCV: 86.4 fL (ref 78.0–100.0)
RDW: 12.9 % (ref 11.5–15.5)

## 2012-10-15 MED ORDER — ONDANSETRON HCL 4 MG/2ML IJ SOLN
4.0000 mg | Freq: Once | INTRAMUSCULAR | Status: AC
  Administered 2012-10-15: 4 mg via INTRAVENOUS
  Filled 2012-10-15: qty 2

## 2012-10-15 MED ORDER — ALBUTEROL SULFATE (5 MG/ML) 0.5% IN NEBU
5.0000 mg | INHALATION_SOLUTION | Freq: Once | RESPIRATORY_TRACT | Status: AC
  Administered 2012-10-15: 5 mg via RESPIRATORY_TRACT
  Filled 2012-10-15: qty 1

## 2012-10-15 MED ORDER — SODIUM CHLORIDE 0.9 % IV SOLN
INTRAVENOUS | Status: DC
  Administered 2012-10-15: via INTRAVENOUS

## 2012-10-15 MED ORDER — METHYLPREDNISOLONE SODIUM SUCC 125 MG IJ SOLR
125.0000 mg | Freq: Once | INTRAMUSCULAR | Status: AC
  Administered 2012-10-16: 125 mg via INTRAVENOUS
  Filled 2012-10-15: qty 2

## 2012-10-15 MED ORDER — LEVOFLOXACIN IN D5W 750 MG/150ML IV SOLN
750.0000 mg | Freq: Once | INTRAVENOUS | Status: AC
  Administered 2012-10-16: 750 mg via INTRAVENOUS
  Filled 2012-10-15: qty 150

## 2012-10-15 MED ORDER — ACETAMINOPHEN 325 MG PO TABS
650.0000 mg | ORAL_TABLET | Freq: Once | ORAL | Status: AC
  Administered 2012-10-15: 650 mg via ORAL
  Filled 2012-10-15: qty 2

## 2012-10-15 MED ORDER — DEXTROSE 5 % IV SOLN
1.0000 g | INTRAVENOUS | Status: DC
  Filled 2012-10-15: qty 10

## 2012-10-15 NOTE — ED Notes (Signed)
Pt c/o nausea; body aches; diagnosed with uti last night

## 2012-10-15 NOTE — ED Provider Notes (Signed)
History     CSN: [id number]  Arrival date & time 10/15/12  2025   First MD Initiated Contact with Patient 10/15/12 2307      Chief Complaint  Patient presents with  . Nausea    (Consider location/radiation/quality/duration/timing/severity/associated sxs/prior treatment) HPI Hx per PT - sick for the last few days with fever and body aches, saw PCP 2 days ago and was started on Septra for UTI. Taking medications without relief. Some cough, some wheezing, no CP or SOB, no ABD pain or dysuria. Has nausea no vomiting. No diarrhea. Symptoms MOD in severity, no rash.  Past Medical History  Diagnosis Date  . Arthritis   . Hypertension   . COPD (chronic obstructive pulmonary disease)   . Thyroid disease     HYPOTHYROID  . Osteoporosis     OSTEOPENIA  . Former smoker   . HH (hiatus hernia)   . GERD (gastroesophageal reflux disease)   . Asthma     Past Surgical History  Procedure Laterality Date  . Abdominal hysterectomy    . Cholecystectomy      No family history on file.  History  Substance Use Topics  . Smoking status: Former Games developer  . Smokeless tobacco: Never Used  . Alcohol Use: No    OB History   Grav Para Term Preterm Abortions TAB SAB Ect Mult Living                  Review of Systems  Constitutional: Positive for fever and chills.  HENT: Negative for neck pain and neck stiffness.   Eyes: Negative for visual disturbance.  Respiratory: Positive for cough and wheezing. Negative for shortness of breath.   Cardiovascular: Negative for chest pain.  Gastrointestinal: Negative for vomiting and abdominal pain.  Genitourinary: Negative for dysuria and flank pain.  Musculoskeletal: Negative for back pain.  Skin: Negative for rash.  Neurological: Negative for headaches.  All other systems reviewed and are negative.    Allergies  Review of patient's allergies indicates no known allergies.  Home Medications   Current Outpatient Rx  Name  Route  Sig  Dispense   Refill  . albuterol (PROVENTIL HFA;VENTOLIN HFA) 108 (90 BASE) MCG/ACT inhaler   Inhalation   Inhale 2 puffs into the lungs 5 (five) times daily.         Marland Kitchen alendronate (FOSAMAX) 70 MG tablet   Oral   Take 70 mg by mouth every 7 (seven) days. Take with a full glass of water on an empty stomach. On thursdays         . beclomethasone (QVAR) 80 MCG/ACT inhaler   Inhalation   Inhale 4 puffs into the lungs at bedtime.         Marland Kitchen levothyroxine (SYNTHROID, LEVOTHROID) 75 MCG tablet   Oral   Take 1 tablet (75 mcg total) by mouth daily.   30 tablet   PRN   . omeprazole (PRILOSEC) 40 MG capsule   Oral   Take 1 capsule (40 mg total) by mouth daily.   30 capsule   11   . simvastatin (ZOCOR) 40 MG tablet   Oral   Take 40 mg by mouth every morning.         . sulfamethoxazole-trimethoprim (BACTRIM DS,SEPTRA DS) 800-160 MG per tablet   Oral   Take 1 tablet by mouth 2 (two) times daily.   20 tablet   0   . lisinopril-hydrochlorothiazide (PRINZIDE,ZESTORETIC) 10-12.5 MG per tablet   Oral   Take  1 tablet by mouth daily.   30 tablet   3   . ondansetron (ZOFRAN) 4 MG tablet   Oral   Take 4 mg by mouth every 6 (six) hours.           BP 131/65  Pulse 97  Temp(Src) 101.9 F (38.8 C) (Oral)  Resp 18  SpO2 91%  Physical Exam  Constitutional: She is oriented to person, place, and time. She appears well-developed and well-nourished.  HENT:  Head: Normocephalic and atraumatic.  Mouth/Throat: Oropharynx is clear and moist.  Dry mm  Eyes: Conjunctivae and EOM are normal. Pupils are equal, round, and reactive to light.  Neck: Normal range of motion. Neck supple.  Cardiovascular: Normal rate, regular rhythm and intact distal pulses.   Pulmonary/Chest: Effort normal. No stridor. No respiratory distress.  Exp wheezes bilateral o/w moving good air  Abdominal: Soft. Bowel sounds are normal. She exhibits no distension. There is no tenderness.  Musculoskeletal: Normal range of  motion. She exhibits no edema.  Lymphadenopathy:    She has no cervical adenopathy.  Neurological: She is alert and oriented to person, place, and time.  Skin: Skin is warm and dry.    ED Course  Procedures (including critical care time)  Results for orders placed during the hospital encounter of 10/15/12  CBC      Result Value Range   WBC 7.5  4.0 - 10.5 K/uL   RBC 3.90  3.87 - 5.11 MIL/uL   Hemoglobin 11.5 (*) 12.0 - 15.0 g/dL   HCT 16.1 (*) 09.6 - 04.5 %   MCV 86.4  78.0 - 100.0 fL   MCH 29.5  26.0 - 34.0 pg   MCHC 34.1  30.0 - 36.0 g/dL   RDW 40.9  81.1 - 91.4 %   Platelets 269  150 - 400 K/uL  COMPREHENSIVE METABOLIC PANEL      Result Value Range   Sodium 128 (*) 135 - 145 mEq/L   Potassium 4.1  3.5 - 5.1 mEq/L   Chloride 95 (*) 96 - 112 mEq/L   CO2 21  19 - 32 mEq/L   Glucose, Bld 117 (*) 70 - 99 mg/dL   BUN 15  6 - 23 mg/dL   Creatinine, Ser 7.82 (*) 0.50 - 1.10 mg/dL   Calcium 8.4  8.4 - 95.6 mg/dL   Total Protein 6.6  6.0 - 8.3 g/dL   Albumin 3.4 (*) 3.5 - 5.2 g/dL   AST 26  0 - 37 U/L   ALT 16  0 - 35 U/L   Alkaline Phosphatase 52  39 - 117 U/L   Total Bilirubin 0.2 (*) 0.3 - 1.2 mg/dL   GFR calc non Af Amer 30 (*) >90 mL/min   GFR calc Af Amer 34 (*) >90 mL/min  POCT I-STAT, CHEM 8      Result Value Range   Sodium 132 (*) 135 - 145 mEq/L   Potassium 4.1  3.5 - 5.1 mEq/L   Chloride 100  96 - 112 mEq/L   BUN 14  6 - 23 mg/dL   Creatinine, Ser 2.13 (*) 0.50 - 1.10 mg/dL   Glucose, Bld 086 (*) 70 - 99 mg/dL   Calcium, Ion 5.78 (*) 1.13 - 1.30 mmol/L   TCO2 20  0 - 100 mmol/L   Hemoglobin 11.9 (*) 12.0 - 15.0 g/dL   HCT 46.9 (*) 62.9 - 52.8 %  CG4 I-STAT (LACTIC ACID)      Result Value Range  Lactic Acid, Venous 1.47  0.5 - 2.2 mmol/L   Dg Chest Portable 1 View  10/16/2012  *RADIOLOGY REPORT*  Clinical Data: Nausea and shortness of breath  PORTABLE CHEST - 1 VIEW  Comparison: 06/24/2010  Findings: Artifact over the medial right upper lobe.  Patient  rotated right. Normal heart size.  Right hemidiaphragm eventration laterally. No pleural effusion or pneumothorax. Diffuse peribronchial thickening.  There is increased density at the medial right lung base.  This is similar to the configuration on the baseline exam of 11/26/2007.  Resolved right upper lobe airspace disease since that study.  IMPRESSION: Medial right lung base opacity which especially when correlated with the 06/22/2010 CT, is favored to represent a prominent epicardial fat pad.  Peribronchial thickening which may relate to chronic bronchitis or smoking. No convincing evidence of acute pneumonia.   Original Report Authenticated By: Jeronimo Greaves, M.D.     IVfs, IV ABx, oxygen, albuterol/ steroids  Records reviewed no obvious UTI on previous visit  MDM  Fever despite outpatient ABx, hypoxia and wheezing  CXR, labs, repeat UA pending  MED consult and admit     Sunnie Nielsen, MD 10/16/12 [phone]

## 2012-10-15 NOTE — ED Notes (Signed)
Did not see Dr to give lactic-acid report.

## 2012-10-16 ENCOUNTER — Inpatient Hospital Stay (HOSPITAL_COMMUNITY): Payer: Medicare Other

## 2012-10-16 DIAGNOSIS — J449 Chronic obstructive pulmonary disease, unspecified: Secondary | ICD-10-CM

## 2012-10-16 DIAGNOSIS — I10 Essential (primary) hypertension: Secondary | ICD-10-CM

## 2012-10-16 DIAGNOSIS — R0602 Shortness of breath: Secondary | ICD-10-CM

## 2012-10-16 DIAGNOSIS — N179 Acute kidney failure, unspecified: Secondary | ICD-10-CM | POA: Diagnosis present

## 2012-10-16 DIAGNOSIS — R509 Fever, unspecified: Secondary | ICD-10-CM | POA: Diagnosis present

## 2012-10-16 DIAGNOSIS — E871 Hypo-osmolality and hyponatremia: Secondary | ICD-10-CM | POA: Diagnosis present

## 2012-10-16 DIAGNOSIS — E039 Hypothyroidism, unspecified: Secondary | ICD-10-CM

## 2012-10-16 DIAGNOSIS — N189 Chronic kidney disease, unspecified: Secondary | ICD-10-CM | POA: Diagnosis present

## 2012-10-16 DIAGNOSIS — I959 Hypotension, unspecified: Secondary | ICD-10-CM | POA: Diagnosis present

## 2012-10-16 LAB — SODIUM, URINE, RANDOM: Sodium, Ur: 45 mEq/L

## 2012-10-16 LAB — DIFFERENTIAL
Basophils Absolute: 0 10*3/uL (ref 0.0–0.1)
Basophils Relative: 0 % (ref 0–1)
Monocytes Relative: 1 % — ABNORMAL LOW (ref 3–12)
Neutro Abs: 5.8 10*3/uL (ref 1.7–7.7)
Neutrophils Relative %: 93 % — ABNORMAL HIGH (ref 43–77)

## 2012-10-16 LAB — COMPREHENSIVE METABOLIC PANEL
Albumin: 3.4 g/dL — ABNORMAL LOW (ref 3.5–5.2)
BUN: 15 mg/dL (ref 6–23)
Calcium: 8.4 mg/dL (ref 8.4–10.5)
Creatinine, Ser: 1.56 mg/dL — ABNORMAL HIGH (ref 0.50–1.10)
Potassium: 4.1 mEq/L (ref 3.5–5.1)
Total Protein: 6.6 g/dL (ref 6.0–8.3)

## 2012-10-16 LAB — URINALYSIS, ROUTINE W REFLEX MICROSCOPIC
Glucose, UA: NEGATIVE mg/dL
Nitrite: NEGATIVE
Specific Gravity, Urine: 1.019 (ref 1.005–1.030)
pH: 5.5 (ref 5.0–8.0)

## 2012-10-16 LAB — CBC
MCV: 86.4 fL (ref 78.0–100.0)
Platelets: 251 10*3/uL (ref 150–400)
RBC: 3.67 MIL/uL — ABNORMAL LOW (ref 3.87–5.11)
WBC: 6.4 10*3/uL (ref 4.0–10.5)

## 2012-10-16 LAB — BASIC METABOLIC PANEL
CO2: 19 mEq/L (ref 19–32)
Calcium: 8.2 mg/dL — ABNORMAL LOW (ref 8.4–10.5)
Chloride: 96 mEq/L (ref 96–112)
GFR calc Af Amer: 34 mL/min — ABNORMAL LOW (ref >90)
Sodium: 129 mEq/L — ABNORMAL LOW (ref 135–145)

## 2012-10-16 LAB — URINE MICROSCOPIC-ADD ON

## 2012-10-16 LAB — C-REACTIVE PROTEIN: CRP: 4.3 mg/dL — ABNORMAL HIGH (ref <0.60)

## 2012-10-16 LAB — TSH: TSH: 1.046 u[IU]/mL (ref 0.350–4.500)

## 2012-10-16 LAB — PATHOLOGIST SMEAR REVIEW

## 2012-10-16 LAB — SEDIMENTATION RATE: Sed Rate: 20 mm/hr (ref 0–22)

## 2012-10-16 MED ORDER — PREDNISONE 50 MG PO TABS
50.0000 mg | ORAL_TABLET | Freq: Every day | ORAL | Status: DC
  Administered 2012-10-17: 50 mg via ORAL
  Filled 2012-10-16 (×2): qty 1

## 2012-10-16 MED ORDER — ALBUTEROL SULFATE (5 MG/ML) 0.5% IN NEBU: 5.0000 mg | INHALATION_SOLUTION | RESPIRATORY_TRACT | Status: DC | PRN

## 2012-10-16 MED ORDER — ONDANSETRON HCL 4 MG/2ML IJ SOLN
4.0000 mg | Freq: Four times a day (QID) | INTRAMUSCULAR | Status: DC | PRN
  Administered 2012-10-16 (×2): 4 mg via INTRAVENOUS
  Filled 2012-10-16 (×2): qty 2

## 2012-10-16 MED ORDER — DOXYCYCLINE HYCLATE 100 MG IV SOLR
100.0000 mg | Freq: Two times a day (BID) | INTRAVENOUS | Status: DC
  Administered 2012-10-16 – 2012-10-18 (×4): 100 mg via INTRAVENOUS
  Filled 2012-10-16 (×5): qty 100

## 2012-10-16 MED ORDER — SODIUM CHLORIDE 0.9 % IV SOLN: INTRAVENOUS | Status: DC

## 2012-10-16 MED ORDER — POTASSIUM CHLORIDE CRYS ER 20 MEQ PO TBCR
40.0000 meq | EXTENDED_RELEASE_TABLET | Freq: Once | ORAL | Status: AC
  Administered 2012-10-16: 40 meq via ORAL
  Filled 2012-10-16: qty 2

## 2012-10-16 MED ORDER — ALBUTEROL SULFATE (5 MG/ML) 0.5% IN NEBU
5.0000 mg | INHALATION_SOLUTION | RESPIRATORY_TRACT | Status: DC | PRN
  Administered 2012-10-17: 2.5 mg via RESPIRATORY_TRACT

## 2012-10-16 MED ORDER — ENOXAPARIN SODIUM 30 MG/0.3ML ~~LOC~~ SOLN
30.0000 mg | SUBCUTANEOUS | Status: DC
  Administered 2012-10-16 – 2012-10-17 (×2): 30 mg via SUBCUTANEOUS
  Filled 2012-10-16 (×2): qty 0.3

## 2012-10-16 MED ORDER — IPRATROPIUM BROMIDE 0.02 % IN SOLN: 0.5000 mg | Freq: Four times a day (QID) | RESPIRATORY_TRACT | Status: DC | PRN

## 2012-10-16 MED ORDER — LEVOFLOXACIN IN D5W 750 MG/150ML IV SOLN
750.0000 mg | INTRAVENOUS | Status: DC
  Administered 2012-10-16: 750 mg via INTRAVENOUS
  Filled 2012-10-16: qty 150

## 2012-10-16 MED ORDER — LEVOTHYROXINE SODIUM 75 MCG PO TABS
75.0000 ug | ORAL_TABLET | Freq: Every day | ORAL | Status: DC
  Administered 2012-10-16 – 2012-10-18 (×3): 75 ug via ORAL
  Filled 2012-10-16 (×5): qty 1

## 2012-10-16 MED ORDER — PANTOPRAZOLE SODIUM 40 MG PO TBEC
40.0000 mg | DELAYED_RELEASE_TABLET | Freq: Every day | ORAL | Status: DC
  Administered 2012-10-16: 40 mg via ORAL
  Filled 2012-10-16 (×2): qty 1

## 2012-10-16 MED ORDER — SODIUM CHLORIDE 0.9 % IV SOLN
INTRAVENOUS | Status: DC
  Administered 2012-10-16: 03:00:00 via INTRAVENOUS

## 2012-10-16 MED ORDER — HYDROCODONE-ACETAMINOPHEN 5-325 MG PO TABS
1.0000 | ORAL_TABLET | ORAL | Status: DC | PRN
  Administered 2012-10-16: 1 via ORAL
  Administered 2012-10-16 – 2012-10-18 (×2): 2 via ORAL
  Filled 2012-10-16 (×2): qty 2
  Filled 2012-10-16: qty 1

## 2012-10-16 MED ORDER — SODIUM CHLORIDE 0.9 % IJ SOLN
3.0000 mL | Freq: Two times a day (BID) | INTRAMUSCULAR | Status: DC
  Administered 2012-10-16 – 2012-10-18 (×3): 3 mL via INTRAVENOUS

## 2012-10-16 MED ORDER — ALBUTEROL SULFATE (5 MG/ML) 0.5% IN NEBU
2.5000 mg | INHALATION_SOLUTION | Freq: Four times a day (QID) | RESPIRATORY_TRACT | Status: DC
  Administered 2012-10-16 – 2012-10-18 (×7): 2.5 mg via RESPIRATORY_TRACT
  Filled 2012-10-16 (×9): qty 0.5

## 2012-10-16 MED ORDER — FLUTICASONE PROPIONATE HFA 44 MCG/ACT IN AERO
2.0000 | INHALATION_SPRAY | Freq: Two times a day (BID) | RESPIRATORY_TRACT | Status: DC
  Administered 2012-10-16 – 2012-10-18 (×4): 2 via RESPIRATORY_TRACT
  Filled 2012-10-16: qty 10.6

## 2012-10-16 MED ORDER — ALBUTEROL SULFATE HFA 108 (90 BASE) MCG/ACT IN AERS: 2.0000 | INHALATION_SPRAY | Freq: Every day | RESPIRATORY_TRACT | Status: DC

## 2012-10-16 MED ORDER — ZOLPIDEM TARTRATE 5 MG PO TABS
5.0000 mg | ORAL_TABLET | Freq: Once | ORAL | Status: AC
  Administered 2012-10-16: 5 mg via ORAL
  Filled 2012-10-16: qty 1

## 2012-10-16 MED ORDER — ONDANSETRON HCL 4 MG PO TABS
4.0000 mg | ORAL_TABLET | Freq: Four times a day (QID) | ORAL | Status: DC | PRN
  Administered 2012-10-16 – 2012-10-17 (×2): 4 mg via ORAL
  Filled 2012-10-16 (×2): qty 1

## 2012-10-16 MED ORDER — METHYLPREDNISOLONE SODIUM SUCC 125 MG IJ SOLR
80.0000 mg | Freq: Four times a day (QID) | INTRAMUSCULAR | Status: DC
  Administered 2012-10-16: 80 mg via INTRAVENOUS
  Filled 2012-10-16 (×6): qty 1.28

## 2012-10-16 NOTE — Progress Notes (Signed)
   CARE MANAGEMENT NOTE 10/16/2012  Patient:  Laurie Ward, Laurie Ward   Account Number:  [id number]  Date Initiated:  10/16/2012  Documentation initiated by:  Jiles Crocker  Subjective/Objective Assessment:   ADMITTED WITH UTI,FEVER     Action/Plan:   PCP: Carollee Herter, MD  LIVES AT HOME WITH SPOUSE; POSSIBLY NEED HHC AT DISCHARGE; CM FOLLOWING FOR DCP   Anticipated DC Date:  10/19/2012   Anticipated DC Plan:  HOME W HOME HEALTH SERVICES      DC Planning Services  CM consult            Status of service:  In process, will continue to follow Medicare Important Message given?  NA - LOS <3 / Initial given by admissions (If response is "NO", the following Medicare IM given date fields will be blank) Per UR Regulation:  Reviewed for med. necessity/level of care/duration of stay Comments:  10/16/2012- B Gerilynn Mccullars RN,BSN,MHA

## 2012-10-16 NOTE — Evaluation (Signed)
Physical Therapy Evaluation Patient Details Name: Laurie Ward MRN: [id number] DOB: 1926-11-29 Today's Date: 10/16/2012 Time: 9562-1308 PT Time Calculation (min): 12 min  PT Assessment / Plan / Recommendation Clinical Impression  Pt is an 79 year old female admitted for fever.  Pt would benefit from acute PT services in order to improve independence with transfers and ambulation to prepare for d/c home with spouse.  Pt reports poor balance just PTA due to pain in back of LEs (dopplers negative) however pain is better today.    PT Assessment  Patient needs continued PT services    Follow Up Recommendations  Home health PT    Does the patient have the potential to tolerate intense rehabilitation      Barriers to Discharge        Equipment Recommendations  Cane    Recommendations for Other Services     Frequency Min 3X/week    Precautions / Restrictions Precautions Precautions: Fall   Pertinent Vitals/Pain BP 115/66 and HR 99 sitting after dizzy and nauseated in hallway       Mobility  Bed Mobility Bed Mobility: Supine to Sit Supine to Sit: 5: Supervision;HOB elevated Details for Bed Mobility Assistance: cues for removing all sheets Transfers Transfers: Sit to Stand;Stand to Sit Sit to Stand: 4: Min guard;With upper extremity assist;From chair/3-in-1;From bed Stand to Sit: 4: Min guard;With upper extremity assist;To chair/3-in-1 Details for Transfer Assistance: verbal cues for safe technique Ambulation/Gait Ambulation/Gait Assistance: 4: Min assist Ambulation Distance (Feet): 50 Feet (x2) Assistive device: None Ambulation/Gait Assistance Details: pt with mild unsteadiness however no true LOB observed, pt became nauseated and dizzy so leaned against wall until nsg tech pulled up chair, BP 115/66 mmHg, pt rested in chair until feeling better and then wished to ambulate back to room Gait Pattern: Step-through pattern Gait velocity: decreased    Exercises     PT  Diagnosis: Difficulty walking  PT Problem List: Decreased activity tolerance;Decreased balance;Decreased mobility;Decreased knowledge of use of DME PT Treatment Interventions: DME instruction;Gait training;Neuromuscular re-education;Balance training;Patient/family education;Functional mobility training;Therapeutic activities;Therapeutic exercise   PT Goals Acute Rehab PT Goals PT Goal Formulation: With patient Time For Goal Achievement: 10/23/12 Potential to Achieve Goals: Good Pt will go Sit to Stand: with modified independence Pt will go Stand to Sit: with modified independence PT Goal: Stand to Sit - Progress: Goal set today Pt will Ambulate: >150 feet;with modified independence;with least restrictive assistive device PT Goal: Ambulate - Progress: Progressing toward goal Pt will Perform Home Exercise Program: with supervision, verbal cues required/provided PT Goal: Perform Home Exercise Program - Progress: Goal set today  Visit Information  Last PT Received On: 10/16/12 Assistance Needed: +1    Subjective Data  Subjective: I started having balance problems when the back of my legs started hurting (prior to admission)   Prior Functioning  Home Living Lives With: Spouse Type of Home: House Home Access: Level entry Home Layout: One level Home Adaptive Equipment: Walker - rolling Prior Function Level of Independence: Independent Communication Communication: No difficulties    Cognition  Cognition Arousal/Alertness: Awake/alert Behavior During Therapy: WFL for tasks assessed/performed Overall Cognitive Status: Within Functional Limits for tasks assessed    Extremity/Trunk Assessment Right Lower Extremity Assessment RLE ROM/Strength/Tone: [facility] for tasks assessed Left Lower Extremity Assessment LLE ROM/Strength/Tone: [facility] for tasks assessed Trunk Assessment Trunk Assessment: Kyphotic;Other exceptions Trunk Exceptions: forward head posture   Balance    End of Session PT -  End of Session Equipment Utilized During Treatment:  Gait belt Activity Tolerance: Other (comment) (nausea, dizziness) Patient left: in chair;with call bell/phone within reach Nurse Communication:  (nsg tech in room upon leaving)  GP     Jesiah Yerby,KATHrine E 10/16/2012, 10:35 AM Zenovia Jarred, PT, DPT 10/16/2012 Pager: [phone]

## 2012-10-16 NOTE — Progress Notes (Signed)
Bilateral lower extremity venous duplex:  No evidence of DVT, superficial thrombosis, or Baker's Cyst.   

## 2012-10-16 NOTE — Progress Notes (Addendum)
[facility]                                                                                Patient Demographics  Laurie Ward, is a 79 y.o. female, DOB - 01-20-1927, WUJ:[id number], NFA:[id number]  Admit date - 10/15/2012  Admitting Physician Dorothea Ogle, MD  Outpatient Primary MD for the patient is Carollee Herter, MD  LOS - 1   Chief Complaint  Patient presents with  . Nausea        Assessment & Plan    Fever with mild COPD exacerbation  Likely due to mild URI she does give some history of cough and wheezing, better on Levaquin (switched to doxy - rash)  , wheezing much improved, which to oral prednisone and taper, 2 view chest x-ray and UA are stable, we'll closely monitor.    Developed mild Macular Rash below both arms, non itchy  ?viral vs Levaquin induced, switch to Doxy, monitor.     Hypotension  - since pt is somewhat hypotensive on admission I will continue to hold lisinopril-HCTZ  - Continue gentle IV fluids blood pressure is improving    Hyponatremia  - likely component of pre renal etiology  - Will check urine sodium, urine osmolality, urine creatinine and serum osmolality, repeat BMP in the morning, continue gentle hydration     Acute versus chronic renal failure  - Creatinine from 3 days ago is 1.5, creatinine from 1 year ago is 1, again urine studies as above, IV fluids, repeat BMP in the morning, avoid ACE/ARB and diuretics for now.     Hypothyroid  - Pending TSH continue synthroid as per home medical regimen    Lab Results  Component Value Date   TSH 2.549 04/07/2012     Right calf area discomfort prior to admission.  Will check a venous duplex to rule out a DVT     Code Status: Full  Family Communication: Husband  Disposition Plan: Home   Procedures CXR, lower extremity venous duplex   Consults     DVT Prophylaxis  Lovenox    Lab Results  Component Value Date   PLT 251 10/16/2012     Medications  Scheduled Meds: . albuterol  2.5 mg Nebulization QID  . enoxaparin (LOVENOX) injection  30 mg Subcutaneous Q24H  . fluticasone  2 puff Inhalation BID  . levofloxacin (LEVAQUIN) IV  750 mg Intravenous Q24H  . levothyroxine  75 mcg Oral Daily  . pantoprazole  40 mg Oral Daily  . [START ON 10/17/2012] predniSONE  50 mg Oral Q breakfast  . sodium chloride  3 mL Intravenous Q12H   Continuous Infusions: . sodium chloride 50 mL/hr at 10/16/12 0302   PRN Meds:.albuterol, HYDROcodone-acetaminophen, ipratropium, ondansetron (ZOFRAN) IV, ondansetron  Antibiotics     Anti-infectives   Start     Dose/Rate Route Frequency Ordered Stop   10/16/12 0200  levofloxacin (LEVAQUIN) IVPB 750 mg     750 mg 100 mL/hr over 90 Minutes Intravenous Every 24 hours 10/16/12 0159     10/15/12 2330  levofloxacin (LEVAQUIN) IVPB 750 mg     750 mg 100 mL/hr over 90 Minutes Intravenous  Once 10/15/12 2329  10/16/12 0125   10/15/12 2315  cefTRIAXone (ROCEPHIN) 1 g in dextrose 5 % 50 mL IVPB  Status:  Discontinued     1 g 100 mL/hr over 30 Minutes Intravenous Every 24 hours 10/15/12 2307 10/15/12 2329       Time Spent in minutes   35   Oleva Koo K M.D on 10/16/2012 at 9:52 AM  Between 7am to 7pm - Pager - [phone]  After 7pm go to [email or web address] - password [email or web address]  And look for the night coverage person covering for me after hours  [facility] Office  [phone]    Subjective:   Laurie Ward today has, No headache, No chest pain, No abdominal pain - No Nausea, No new weakness tingling or numbness, No Cough - SOB.    Objective:   Filed Vitals:   10/16/12 0257 10/16/12 0454 10/16/12 0547 10/16/12 0845  BP: 86/69  104/78   Pulse: 72  92   Temp: 97.4 F (36.3 C)  97.7 F (36.5 C)   TempSrc: Oral  Oral   Resp: 22  20   Height: 5\' 2"  (1.575 m)     Weight: 72.258 kg (159 lb 4.8 oz)     SpO2: 97% 94% 93% 90%    Wt Readings from Last 3 Encounters:  10/16/12  72.258 kg (159 lb 4.8 oz)  10/13/12 70.761 kg (156 lb)  10/12/12 70.761 kg (156 lb)    No intake or output data in the 24 hours ending 10/16/12 0952  Exam Awake Alert, Oriented X 3, No new F.N deficits, Normal affect [place].AT,PERRAL Supple Neck,No JVD, No cervical lymphadenopathy appriciated.  Symmetrical Chest wall movement, Good air movement bilaterally, CTAB RRR,No Gallops,Rubs or new Murmurs, No Parasternal Heave +ve B.Sounds, Abd Soft, Non tender, No organomegaly appriciated, No rebound - guarding or rigidity. No Cyanosis, Clubbing or edema, mild macular rash below both arms   Data Review   Micro Results No results found for this or any previous visit (from the past 240 hour(s)).  Radiology Reports Dg Chest 2 View  10/16/2012  *RADIOLOGY REPORT*  Clinical Data: Fever, hypertension.  CHEST - 2 VIEW  Comparison: 10/15/2012  Findings: Rounded density in the medial right lung base again noted, stable, felt to represent prominent epicardial fat pad. Bibasilar atelectasis or scarring.  Heart is normal size.  No effusions.  No acute bony abnormality.  IMPRESSION: Bibasilar atelectasis or scarring.  No change.   Original Report Authenticated By: Charlett Nose, M.D.    Dg Chest Portable 1 View  10/16/2012  *RADIOLOGY REPORT*  Clinical Data: Nausea and shortness of breath  PORTABLE CHEST - 1 VIEW  Comparison: 06/24/2010  Findings: Artifact over the medial right upper lobe.  Patient rotated right. Normal heart size.  Right hemidiaphragm eventration laterally. No pleural effusion or pneumothorax. Diffuse peribronchial thickening.  There is increased density at the medial right lung base.  This is similar to the configuration on the baseline exam of 11/26/2007.  Resolved right upper lobe airspace disease since that study.  IMPRESSION: Medial right lung base opacity which especially when correlated with the 06/22/2010 CT, is favored to represent a prominent epicardial fat pad.  Peribronchial thickening  which may relate to chronic bronchitis or smoking. No convincing evidence of acute pneumonia.   Original Report Authenticated By: Jeronimo Greaves, M.D.     CBC  Recent Labs Lab 10/13/12 2040 10/15/12 2340 10/15/12 2353 10/16/12 0555  WBC 11.1* 7.5  --  6.4  HGB 12.6 11.5* 11.9* 10.9*  HCT 37.8 33.7* 35.0* 31.7*  PLT 398 269  --  251  MCV 88.3 86.4  --  86.4  MCH 29.4 29.5  --  29.7  MCHC 33.3 34.1  --  34.4  RDW 13.0 12.9  --  12.9  LYMPHSABS 0.6*  --   --  0.3*  MONOABS 0.6  --   --  0.1  EOSABS 0.3  --   --  0.1  BASOSABS 0.0  --   --  0.0    Chemistries   Recent Labs Lab 10/13/12 2040 10/15/12 2340 10/15/12 2353 10/16/12 0555  NA 137 128* 132* 129*  K 3.9 4.1 4.1 3.6  CL 101 95* 100 96  CO2 26 21  --  19  GLUCOSE 130* 117* 119* 168*  BUN 21 15 14 17   CREATININE 1.50* 1.56* 1.70* 1.56*  CALCIUM 9.8 8.4  --  8.2*  AST 15 26  --   --   ALT 10 16  --   --   ALKPHOS 42 52  --   --   BILITOT 0.4 0.2*  --   --    ------------------------------------------------------------------------------------------------------------------ estimated creatinine clearance is 25.5 ml/min (by C-G formula based on Cr of 1.56). ------------------------------------------------------------------------------------------------------------------ No results found for this basename: HGBA1C,  in the last 72 hours ------------------------------------------------------------------------------------------------------------------ No results found for this basename: CHOL, HDL, LDLCALC, TRIG, CHOLHDL, LDLDIRECT,  in the last 72 hours ------------------------------------------------------------------------------------------------------------------ No results found for this basename: TSH, T4TOTAL, FREET3, T3FREE, THYROIDAB,  in the last 72 hours ------------------------------------------------------------------------------------------------------------------ No results found for this basename: VITAMINB12,  FOLATE, FERRITIN, TIBC, IRON, RETICCTPCT,  in the last 72 hours  Coagulation profile No results found for this basename: INR, PROTIME,  in the last 168 hours  No results found for this basename: DDIMER,  in the last 72 hours  Cardiac Enzymes  Recent Labs Lab 10/13/12 2040  TROPONINI <0.30   ------------------------------------------------------------------------------------------------------------------ No components found with this basename: POCBNP,

## 2012-10-16 NOTE — H&P (Signed)
[facility] History and Physical  LASHEBA STEVENS WUJ:[id number] DOB: May 04, 1927 DOA: 10/15/2012  Referring physician: ED physician PCP: Carollee Herter, MD   Chief Complaint: Fever  HPI:  Pt is 79 yo female who presents to [facility] ED with main concern of progressive fevers as high as 103 F. Pt reports being seen in PCP office and was started on Bactrim but her fever persisted. Pt reports persistent one week duration cough productive of yellow sputum, some wheezing, associated with exertional shortness of breath, relieved with rest. Pt also reports ongoing urinary urgency and frequency but denies other abdominal or urinary symptoms. Pt denies chest pain, no specific focal neurological weakness, no visual changes and no headaches.  In ED, pt with Tmax 102, unclear etiology, Levaquin IV given and [facility] asked to admit for further evaluation.   Assessment and Plan:  Principal Problem:   Fever - unknown etiology at this time, ? UTI, CXR not remarkable for PNA - pt was started on Levaquin in ED and I agree with choice - will need to follow upon urine culture and blood cultures - supportive care for now with IVF Active Problems:   Hypotension - since pt is somewhat hypotensive on admission I will hold lisinopril-HCTZ - give IVF as noted above and monitor on telemetry    Hyponatremia - likely component of pre renal etiology - IVF and repeat BMP in AM   Acute on chronic renal failure - creatinine up from several days ago - likely pre renal etiology imposed on chronic kidney disease - again IVF, monitor I's and O's and repeat BMP in AM   Hypothyroid - check TSH and continue synthroid as per home medical regimen    COPD - improved clinically since she first came to ED earlier - target oxygen saturations - continue albuterol nebulizer scheduled and as needed - solumedrol for now with planned tapering - continue Levaquin IV  Code Status: Full Family Communication: Pt at  bedside Disposition Plan: Admit to telemetry bed   Review of Systems:  Constitutional: Positive for fever, chills and malaise/fatigue. Negative for diaphoresis.  HENT: Negative for hearing loss, ear pain, nosebleeds, congestion, sore throat, neck pain, tinnitus and ear discharge.   Eyes: Negative for blurred vision, double vision, photophobia, pain, discharge and redness.  Respiratory: Positive for cough, sputum production, wheezing.   Cardiovascular: Negative for chest pain, palpitations, orthopnea, claudication and leg swelling.  Gastrointestinal: Negative for nausea, vomiting and abdominal pain. Negative for heartburn, constipation, blood in stool and melena.  Genitourinary: Negative for dysuria, hematuria and flank pain.  Musculoskeletal: Negative for myalgias, back pain, joint pain and falls.  Skin: Negative for itching and rash.  Neurological: Negative for tingling, tremors, sensory change, speech change, focal weakness, loss of consciousness and headaches.  Endo/Heme/Allergies: Negative for environmental allergies and polydipsia. Does not bruise/bleed easily.  Psychiatric/Behavioral: Negative for suicidal ideas. The patient is not nervous/anxious.      Past Medical History  Diagnosis Date  . Arthritis   . Hypertension   . COPD (chronic obstructive pulmonary disease)   . Thyroid disease     HYPOTHYROID  . Osteoporosis     OSTEOPENIA  . Former smoker   . HH (hiatus hernia)   . GERD (gastroesophageal reflux disease)   . Asthma     Past Surgical History  Procedure Laterality Date  . Abdominal hysterectomy    . Cholecystectomy      Social History:  reports that she has quit smoking. She has never used  smokeless tobacco. She reports that she does not drink alcohol or use illicit drugs.  No Known Allergies  Family history of HTN  Prior to Admission medications   Medication Sig Start Date End Date Taking? Authorizing Provider  albuterol (PROVENTIL HFA;VENTOLIN HFA) 108  (90 BASE) MCG/ACT inhaler Inhale 2 puffs into the lungs 5 (five) times daily.   Yes Historical Provider, MD  alendronate (FOSAMAX) 70 MG tablet Take 70 mg by mouth every 7 (seven) days. Take with a full glass of water on an empty stomach. On thursdays   Yes Historical Provider, MD  beclomethasone (QVAR) 80 MCG/ACT inhaler Inhale 4 puffs into the lungs at bedtime. 04/07/12  Yes Ronnald Nian, MD  levothyroxine (SYNTHROID, LEVOTHROID) 75 MCG tablet Take 1 tablet (75 mcg total) by mouth daily. 04/08/12  Yes Ronnald Nian, MD  omeprazole (PRILOSEC) 40 MG capsule Take 1 capsule (40 mg total) by mouth daily. 04/07/12  Yes Ronnald Nian, MD  simvastatin (ZOCOR) 40 MG tablet Take 40 mg by mouth every morning. 04/07/12  Yes Ronnald Nian, MD  sulfamethoxazole-trimethoprim (BACTRIM DS,SEPTRA DS) 800-160 MG per tablet Take 1 tablet by mouth 2 (two) times daily. 10/12/12  Yes Ronnald Nian, MD  lisinopril-hydrochlorothiazide (PRINZIDE,ZESTORETIC) 10-12.5 MG per tablet Take 1 tablet by mouth daily. 10/12/12   Ronnald Nian, MD  ondansetron (ZOFRAN) 4 MG tablet Take 4 mg by mouth every 6 (six) hours. 10/14/12   Arman Filter, NP    Physical Exam: Filed Vitals:   10/15/12 2357 10/16/12 0120 10/16/12 0122 10/16/12 0147  BP:  61/38 96/31 96/41   Pulse:  105  97  Temp:    99.5 F (37.5 C)  TempSrc:    Oral  Resp:  18  20  SpO2: 92% 100%  100%    Physical Exam  Constitutional: Appears well-developed and well-nourished. No distress.  HENT: Normocephalic. External right and left ear normal. Oropharynx is clear and moist.  Eyes: Conjunctivae and EOM are normal. PERRLA, no scleral icterus.  Neck: Normal ROM. Neck supple. No JVD. No tracheal deviation. No thyromegaly.  CVS: Regular rhythm, tachycardic, S1/S2 +, no murmurs, no gallops, no carotid bruit.  Pulmonary: Effort and breath sounds normal, expiratory wheezing  Abdominal: Soft. BS +,  no distension, tenderness in suprapubic area, no rebound or  guarding.  Musculoskeletal: Normal range of motion. No edema and no tenderness.  Lymphadenopathy: No lymphadenopathy noted, cervical, inguinal. Neuro: Alert. Normal reflexes, muscle tone coordination. No cranial nerve deficit. Skin: Skin is warm and dry. No rash noted. Not diaphoretic. No erythema. No pallor.  Psychiatric: Normal mood and affect. Behavior, judgment, thought content normal.   Labs on Admission:  Basic Metabolic Panel:  Recent Labs Lab 10/13/12 2040 10/15/12 2340 10/15/12 2353  NA 137 128* 132*  K 3.9 4.1 4.1  CL 101 95* 100  CO2 26 21  --   GLUCOSE 130* 117* 119*  BUN 21 15 14   CREATININE 1.50* 1.56* 1.70*  CALCIUM 9.8 8.4  --    Liver Function Tests:  Recent Labs Lab 10/13/12 2040 10/15/12 2340  AST 15 26  ALT 10 16  ALKPHOS 42 52  BILITOT 0.4 0.2*  PROT 6.8 6.6  ALBUMIN 3.6 3.4*    Recent Labs Lab 10/13/12 2040  LIPASE 82*   CBC:  Recent Labs Lab 10/13/12 2040 10/15/12 2340 10/15/12 2353  WBC 11.1* 7.5  --   NEUTROABS 9.6*  --   --   HGB 12.6 11.5* 11.9*  HCT 37.8 33.7* 35.0*  MCV 88.3 86.4  --   PLT 398 269  --    Cardiac Enzymes:  Recent Labs Lab 10/13/12 2040  TROPONINI <0.30   Radiological Exams on Admission: Dg Chest Portable 1 View  10/16/2012  *RADIOLOGY REPORT*  Clinical Data: Nausea and shortness of breath  PORTABLE CHEST - 1 VIEW  Comparison: 06/24/2010  Findings: Artifact over the medial right upper lobe.  Patient rotated right. Normal heart size.  Right hemidiaphragm eventration laterally. No pleural effusion or pneumothorax. Diffuse peribronchial thickening.  There is increased density at the medial right lung base.  This is similar to the configuration on the baseline exam of 11/26/2007.  Resolved right upper lobe airspace disease since that study.  IMPRESSION: Medial right lung base opacity which especially when correlated with the 06/22/2010 CT, is favored to represent a prominent epicardial fat pad.  Peribronchial  thickening which may relate to chronic bronchitis or smoking. No convincing evidence of acute pneumonia.   Original Report Authenticated By: Jeronimo Greaves, M.D.    EKG: Normal sinus rhythm, no ST/T wave changes  Debbora Presto, MD  [facility] Pager [phone]  If 7PM-7AM, please contact night-coverage [email or web address] Password [facility] 10/16/2012, 1:56 AM

## 2012-10-16 NOTE — ED Provider Notes (Signed)
Medical screening examination/treatment/procedure(s) were conducted as a shared visit with non-physician practitioner(s) and myself.  I personally evaluated the patient during the encounter.  Patient with continued symptoms of UTI, currently being treated for 1 day. Examination otherwise unremarkable. Counseled that she see if the medication time to work. Followup with primary Dr.  Gilda Crease, MD 10/16/12 [id number]

## 2012-10-16 NOTE — ED Notes (Signed)
Gave Lactic-acid report to Dr Dierdre Highman.

## 2012-10-17 ENCOUNTER — Inpatient Hospital Stay (HOSPITAL_COMMUNITY): Payer: Medicare Other

## 2012-10-17 ENCOUNTER — Encounter (HOSPITAL_COMMUNITY): Payer: Self-pay | Admitting: Radiology

## 2012-10-17 DIAGNOSIS — N179 Acute kidney failure, unspecified: Secondary | ICD-10-CM

## 2012-10-17 DIAGNOSIS — N189 Chronic kidney disease, unspecified: Secondary | ICD-10-CM

## 2012-10-17 DIAGNOSIS — E871 Hypo-osmolality and hyponatremia: Secondary | ICD-10-CM

## 2012-10-17 LAB — BASIC METABOLIC PANEL
BUN: 18 mg/dL (ref 6–23)
CO2: 20 mEq/L (ref 19–32)
Calcium: 8.2 mg/dL — ABNORMAL LOW (ref 8.4–10.5)
Creatinine, Ser: 1.21 mg/dL — ABNORMAL HIGH (ref 0.50–1.10)
GFR calc non Af Amer: 40 mL/min — ABNORMAL LOW (ref >90)
Glucose, Bld: 99 mg/dL (ref 70–99)

## 2012-10-17 LAB — HEPATIC FUNCTION PANEL
Alkaline Phosphatase: 42 U/L (ref 39–117)
Bilirubin, Direct: <0.1 mg/dL (ref 0.0–0.3)
Total Bilirubin: 0.1 mg/dL — ABNORMAL LOW (ref 0.3–1.2)

## 2012-10-17 LAB — OSMOLALITY, URINE: Osmolality, Ur: 358 mOsm/kg — ABNORMAL LOW (ref 390–1090)

## 2012-10-17 MED ORDER — IOHEXOL 300 MG/ML  SOLN
50.0000 mL | Freq: Once | INTRAMUSCULAR | Status: AC | PRN
  Administered 2012-10-17: 50 mL via ORAL

## 2012-10-17 MED ORDER — SODIUM CHLORIDE 0.9 % IV SOLN: INTRAVENOUS | Status: AC

## 2012-10-17 MED ORDER — PANTOPRAZOLE SODIUM 40 MG IV SOLR
40.0000 mg | Freq: Two times a day (BID) | INTRAVENOUS | Status: DC
  Administered 2012-10-17 – 2012-10-18 (×3): 40 mg via INTRAVENOUS
  Filled 2012-10-17 (×4): qty 40

## 2012-10-17 MED ORDER — ZOLPIDEM TARTRATE 5 MG PO TABS
5.0000 mg | ORAL_TABLET | Freq: Every evening | ORAL | Status: DC | PRN
  Administered 2012-10-17: 5 mg via ORAL
  Filled 2012-10-17: qty 1

## 2012-10-17 MED ORDER — ENOXAPARIN SODIUM 40 MG/0.4ML ~~LOC~~ SOLN
40.0000 mg | SUBCUTANEOUS | Status: DC
  Administered 2012-10-18: 40 mg via SUBCUTANEOUS
  Filled 2012-10-17: qty 0.4

## 2012-10-17 MED ORDER — PREDNISONE 20 MG PO TABS
30.0000 mg | ORAL_TABLET | Freq: Every day | ORAL | Status: DC
  Administered 2012-10-18: 30 mg via ORAL
  Filled 2012-10-17 (×2): qty 1

## 2012-10-17 NOTE — Care Management (Signed)
Pt. Stated she is concerned that she has not had a bowel movement in 5 days. Gave pt  4 oz. Of prune juice and 4 oz of apple juice mixed to improve taste and warmed both. MG [facility]

## 2012-10-17 NOTE — Progress Notes (Signed)
[facility]                                                                                Patient Demographics  Laurie Ward, is a 79 y.o. female, DOB - [birth year], YNW:[id number], MVH:[id number]  Admit date - 10/15/2012  Admitting Physician Dorothea Ogle, MD  Outpatient Primary MD for the patient is Carollee Herter, MD  LOS - 2   Chief Complaint  Patient presents with  . Nausea        Assessment & Plan    Fever with mild COPD exacerbation  Likely due to mild URI she does give some history of cough and wheezing, better on Levaquin (switched to doxy - rash)  , wheezing much improved, which to oral prednisone and taper, 2 view chest x-ray and UA are stable, we'll closely monitor.    Nausea and poor appetite for a week  Patient did have high fevers prior to coming in, I question if she has some element of diverticulitis, mild ache on deep palpation in the left lower quadrant, will check a CT abdomen pelvis with oral contrast only.    Developed mild Macular Rash below both arms, non itchy  ?viral vs Levaquin induced, switch to Doxy, monitor.     Hypotension  - since pt is somewhat hypotensive on admission I will continue to hold lisinopril-HCTZ  - Continue gentle IV fluids blood pressure is improving    Hyponatremia  Likely SIADH, since her oral intake is poor gentle hydration only for now try to restrict free water     Acute versus chronic renal failure  - Creatinine from 3 days ago is 1.5, creatinine from 1 year ago is 1, again urine studies as above, IV fluids, repeat BMP in the morning, avoid ACE/ARB and diuretics for now.     Hypothyroid  stable TSH continue synthroid as per home medical regimen    Lab Results  Component Value Date   TSH 1.046 10/16/2012     Right calf area discomfort prior to admission.  Negative for DVT on venous ultrasound     Code Status: Full  Family Communication: Husband  Disposition Plan:  Home   Procedures CXR, lower extremity venous duplex, CT abd and pelvis   Consults     DVT Prophylaxis  Lovenox    Lab Results  Component Value Date   PLT 251 10/16/2012    Medications  Scheduled Meds: . albuterol  2.5 mg Nebulization QID  . doxycycline (VIBRAMYCIN) IV  100 mg Intravenous Q12H  . enoxaparin (LOVENOX) injection  30 mg Subcutaneous Q24H  . fluticasone  2 puff Inhalation BID  . levothyroxine  75 mcg Oral Daily  . pantoprazole (PROTONIX) IV  40 mg Intravenous Q12H  . [START ON 10/18/2012] predniSONE  30 mg Oral Q breakfast  . sodium chloride  3 mL Intravenous Q12H   Continuous Infusions: . sodium chloride     PRN Meds:.albuterol, HYDROcodone-acetaminophen, ipratropium, ondansetron (ZOFRAN) IV, ondansetron  Antibiotics     Anti-infectives   Start     Dose/Rate Route Frequency Ordered Stop   10/16/12 1200  doxycycline (VIBRAMYCIN) 100 mg in dextrose 5 % 250 mL IVPB  100 mg 125 mL/hr over 120 Minutes Intravenous Every 12 hours 10/16/12 1020     10/16/12 0200  levofloxacin (LEVAQUIN) IVPB 750 mg  Status:  Discontinued     750 mg 100 mL/hr over 90 Minutes Intravenous Every 24 hours 10/16/12 0159 10/16/12 1020   10/15/12 2330  levofloxacin (LEVAQUIN) IVPB 750 mg     750 mg 100 mL/hr over 90 Minutes Intravenous  Once 10/15/12 2329 10/16/12 0125   10/15/12 2315  cefTRIAXone (ROCEPHIN) 1 g in dextrose 5 % 50 mL IVPB  Status:  Discontinued     1 g 100 mL/hr over 30 Minutes Intravenous Every 24 hours 10/15/12 2307 10/15/12 2329       Time Spent in minutes   35   Gerson Fauth K M.D on 10/17/2012 at 10:44 AM  Between 7am to 7pm - Pager - [phone]  After 7pm go to [email or web address] - password [email or web address]  And look for the night coverage person covering for me after hours  [facility] Office  [phone]    Subjective:   Laurie Ward today has, No headache, No chest pain, No abdominal pain , No new weakness tingling or numbness, No Cough -  SOB.  Now complains more of nausea and says it has been there for about a week on intermittent basis.  Objective:   Filed Vitals:   10/16/12 1447 10/16/12 2054 10/17/12 0511 10/17/12 0803  BP: 124/53 112/92 159/85   Pulse: 89 84 90   Temp: 98.2 F (36.8 C) 97.5 F (36.4 C) 96.8 F (36 C)   TempSrc: Oral Oral Oral   Resp: 18 18 18    Height:      Weight:      SpO2: 96% 95% 94% 94%    Wt Readings from Last 3 Encounters:  10/16/12 72.258 kg (159 lb 4.8 oz)  10/13/12 70.761 kg (156 lb)  10/12/12 70.761 kg (156 lb)     Intake/Output Summary (Last 24 hours) at 10/17/12 1044 Last data filed at 10/17/12 0600  Gross per 24 hour  Intake 2711.67 ml  Output    550 ml  Net 2161.67 ml    Exam Awake Alert, Oriented X 3, No new F.N deficits, Normal affect [place].AT,PERRAL Supple Neck,No JVD, No cervical lymphadenopathy appriciated.  Symmetrical Chest wall movement, Good air movement bilaterally, CTAB RRR,No Gallops,Rubs or new Murmurs, No Parasternal Heave +ve B.Sounds, Abd Soft, Non tender, No organomegaly appriciated, No rebound - guarding or rigidity. No Cyanosis, Clubbing or edema, mild macular rash below both arms remains stable   Data Review   Micro Results Recent Results (from the past 240 hour(s))  URINE CULTURE     Status: None   Collection Time    10/16/12  2:00 AM      Result Value Range Status   Specimen Description URINE, CATHETERIZED   Final   Special Requests Normal   Final   Culture  Setup Time 10/16/2012 08:47   Final   Colony Count PENDING   Incomplete   Culture Culture reincubated for better growth   Final   Report Status PENDING   Incomplete    Radiology Reports Dg Chest 2 View  10/16/2012  *RADIOLOGY REPORT*  Clinical Data: Fever, hypertension.  CHEST - 2 VIEW  Comparison: 10/15/2012  Findings: Rounded density in the medial right lung base again noted, stable, felt to represent prominent epicardial fat pad. Bibasilar atelectasis or scarring.  Heart is  normal size.  No effusions.  No acute bony abnormality.  IMPRESSION:  Bibasilar atelectasis or scarring.  No change.   Original Report Authenticated By: Charlett Nose, M.D.    Dg Chest Portable 1 View  10/16/2012  *RADIOLOGY REPORT*  Clinical Data: Nausea and shortness of breath  PORTABLE CHEST - 1 VIEW  Comparison: 06/24/2010  Findings: Artifact over the medial right upper lobe.  Patient rotated right. Normal heart size.  Right hemidiaphragm eventration laterally. No pleural effusion or pneumothorax. Diffuse peribronchial thickening.  There is increased density at the medial right lung base.  This is similar to the configuration on the baseline exam of 11/26/2007.  Resolved right upper lobe airspace disease since that study.  IMPRESSION: Medial right lung base opacity which especially when correlated with the 06/22/2010 CT, is favored to represent a prominent epicardial fat pad.  Peribronchial thickening which may relate to chronic bronchitis or smoking. No convincing evidence of acute pneumonia.   Original Report Authenticated By: Jeronimo Greaves, M.D.     CBC  Recent Labs Lab 10/13/12 2040 10/15/12 2340 10/15/12 2353 10/16/12 0555  WBC 11.1* 7.5  --  6.4  HGB 12.6 11.5* 11.9* 10.9*  HCT 37.8 33.7* 35.0* 31.7*  PLT 398 269  --  251  MCV 88.3 86.4  --  86.4  MCH 29.4 29.5  --  29.7  MCHC 33.3 34.1  --  34.4  RDW 13.0 12.9  --  12.9  LYMPHSABS 0.6*  --   --  0.3*  MONOABS 0.6  --   --  0.1  EOSABS 0.3  --   --  0.1  BASOSABS 0.0  --   --  0.0    Chemistries   Recent Labs Lab 10/13/12 2040 10/15/12 2340 10/15/12 2353 10/16/12 0555 10/17/12 0538  NA 137 128* 132* 129* 131*  K 3.9 4.1 4.1 3.6 4.3  CL 101 95* 100 96 99  CO2 26 21  --  19 20  GLUCOSE 130* 117* 119* 168* 99  BUN 21 15 14 17 18   CREATININE 1.50* 1.56* 1.70* 1.56* 1.21*  CALCIUM 9.8 8.4  --  8.2* 8.2*  AST 15 26  --   --  33  ALT 10 16  --   --  18  ALKPHOS 42 52  --   --  42  BILITOT 0.4 0.2*  --   --  0.1*    ------------------------------------------------------------------------------------------------------------------ estimated creatinine clearance is 32.8 ml/min (by C-G formula based on Cr of 1.21). ------------------------------------------------------------------------------------------------------------------ No results found for this basename: HGBA1C,  in the last 72 hours ------------------------------------------------------------------------------------------------------------------ No results found for this basename: CHOL, HDL, LDLCALC, TRIG, CHOLHDL, LDLDIRECT,  in the last 72 hours ------------------------------------------------------------------------------------------------------------------  Recent Labs  10/16/12 0219  TSH 1.046   ------------------------------------------------------------------------------------------------------------------ No results found for this basename: VITAMINB12, FOLATE, FERRITIN, TIBC, IRON, RETICCTPCT,  in the last 72 hours  Coagulation profile No results found for this basename: INR, PROTIME,  in the last 168 hours  No results found for this basename: DDIMER,  in the last 72 hours  Cardiac Enzymes  Recent Labs Lab 10/13/12 2040  TROPONINI <0.30   ------------------------------------------------------------------------------------------------------------------ No components found with this basename: POCBNP,

## 2012-10-18 LAB — CBC
HCT: 30.3 % — ABNORMAL LOW (ref 36.0–46.0)
Hemoglobin: 10.6 g/dL — ABNORMAL LOW (ref 12.0–15.0)
MCH: 30.1 pg (ref 26.0–34.0)
MCV: 86.1 fL (ref 78.0–100.0)
RBC: 3.52 MIL/uL — ABNORMAL LOW (ref 3.87–5.11)

## 2012-10-18 LAB — COMPREHENSIVE METABOLIC PANEL
CO2: 22 mEq/L (ref 19–32)
Calcium: 8.4 mg/dL (ref 8.4–10.5)
Creatinine, Ser: 1.05 mg/dL (ref 0.50–1.10)
GFR calc Af Amer: 55 mL/min — ABNORMAL LOW (ref >90)
GFR calc non Af Amer: 48 mL/min — ABNORMAL LOW (ref >90)
Glucose, Bld: 96 mg/dL (ref 70–99)
Total Bilirubin: 0.2 mg/dL — ABNORMAL LOW (ref 0.3–1.2)

## 2012-10-18 LAB — URINE CULTURE

## 2012-10-18 LAB — MAGNESIUM: Magnesium: 1.9 mg/dL (ref 1.5–2.5)

## 2012-10-18 MED ORDER — POLYETHYLENE GLYCOL 3350 17 G PO PACK
17.0000 g | PACK | Freq: Two times a day (BID) | ORAL | Status: DC
  Filled 2012-10-18 (×2): qty 1

## 2012-10-18 MED ORDER — DOCUSATE SODIUM 100 MG PO CAPS
200.0000 mg | ORAL_CAPSULE | Freq: Two times a day (BID) | ORAL | Status: DC
  Filled 2012-10-18 (×2): qty 2

## 2012-10-18 MED ORDER — PREDNISONE 5 MG PO TABS: 5.0000 mg | ORAL_TABLET | Freq: Every day | ORAL | Status: DC

## 2012-10-18 MED ORDER — DOXYCYCLINE HYCLATE 100 MG PO CAPS: 100.0000 mg | ORAL_CAPSULE | Freq: Two times a day (BID) | ORAL | Status: DC

## 2012-10-18 MED ORDER — BISACODYL 10 MG RE SUPP: 10.0000 mg | Freq: Every day | RECTAL | Status: DC | PRN

## 2012-10-18 MED ORDER — DSS 100 MG PO CAPS: 200.0000 mg | ORAL_CAPSULE | Freq: Two times a day (BID) | ORAL | Status: DC | PRN

## 2012-10-18 NOTE — Discharge Summary (Signed)
[facility]                                                                                   Laurie Ward, is a 79 y.o. female  DOB 1926/09/17  MRN [id number].  Admission date:  10/15/2012  Discharge Date:  10/18/2012  Primary MD  Carollee Herter, MD  Admitting Physician  Dorothea Ogle, MD  Admission Diagnosis  COPD (chronic obstructive pulmonary disease) [496] Hypertension [401.9] Hypothyroid [244.9] Fever [780.60]  Discharge Diagnosis     Principal Problem:   Fever Active Problems:   Hypothyroid   Hypotension   Hyponatremia   Acute on chronic renal failure    Past Medical History  Diagnosis Date  . Arthritis   . Hypertension   . COPD (chronic obstructive pulmonary disease)   . Thyroid disease     HYPOTHYROID  . Osteoporosis     OSTEOPENIA  . Former smoker   . HH (hiatus hernia)   . GERD (gastroesophageal reflux disease)   . Asthma     Past Surgical History  Procedure Laterality Date  . Abdominal hysterectomy    . Cholecystectomy       Recommendations for primary care physician for things to follow:   Please follow CBC, BMP and a 2 view chest x-ray in 5-7 days   Discharge Diagnoses:   Principal Problem:   Fever Active Problems:   Hypothyroid   Hypotension   Hyponatremia   Acute on chronic renal failure    Discharge Condition: Stable   Diet recommendation: See Discharge Instructions below   Consults     History of present illness and  Hospital Course:     Kindly see H&P for history of present illness and admission details, please review complete Labs, Consult reports and Test reports for all details in brief Laurie Ward, is a 79 y.o. female, with history of hypertension, hypothyroidism, COPD and GERD who was admitted due to low-grade fevers along with wheezing and some nausea secondary to COPD exacerbation, her nausea was secondary to constipation which has resolved after bowel regimen and bowel movements.  Patient  also developed a mild rash in both arms do to Levaquin likely is allergic to Levaquin, she was treated with steroids along with doxycycline for COPD exacerbation with good effect, she is requiring no oxygen now even after ambulation on room air, wheezing has completely resolved, she has no chest pain or shortness of breath, her cough is much improved, she is now afebrile with no leukocytosis. Will be discharged home on a steroid taper and doxycycline along with her home nebulizers.   She had developed mild acute renal insufficiency which has resolved after IV fluids, she was also hypotensive upon admission, this has all resolved I will resume her home dose ACE inhibitor and diuretic combination, we'll request primary care physician to kindly monitor BMP along with her blood pressure and diuretic medication dosages.   Her hypothyroidism was stable her TSH was 1.06 home dose Synthroid will be continued.   Patient also had some right calf area discomfort for which an ultrasound of her right lower extremity was obtained it was unremarkable i.e. there was no  DVT or SVT.      Today   Subjective:   Laurie Ward today has no headache,no chest abdominal pain,no new weakness tingling or numbness, feels much better wants to go home today.    Objective:   Blood pressure 132/62, pulse 77, temperature 97.1 F (36.2 C), temperature source Oral, resp. rate 19, height 5\' 2"  (1.575 m), weight 70.761 kg (156 lb), SpO2 95.00%.   Intake/Output Summary (Last 24 hours) at 10/18/12 0947 Last data filed at 10/18/12 0600  Gross per 24 hour  Intake   1150 ml  Output    602 ml  Net    548 ml    Exam Awake Alert, Oriented *3, No new F.N deficits, Normal affect [place].AT,PERRAL Supple Neck,No JVD, No cervical lymphadenopathy appriciated.  Symmetrical Chest wall movement, Good air movement bilaterally, CTAB RRR,No Gallops,Rubs or new Murmurs, No Parasternal Heave +ve B.Sounds, Abd Soft, Non tender, No  organomegaly appriciated, No rebound -guarding or rigidity. No Cyanosis, Clubbing or edema, No new Rash or bruise  Data Review   Major procedures and Radiology Reports - PLEASE review detailed and final reports for all details in brief -       Ct Abdomen Pelvis Wo Contrast  10/17/2012  *RADIOLOGY REPORT*  Clinical Data: Nausea and vomiting.  CT ABDOMEN AND PELVIS WITHOUT CONTRAST  Technique:  Multidetector CT imaging of the abdomen and pelvis was performed following the standard protocol without intravenous contrast.  Comparison: 06/22/2010  Findings: The lung bases are clear.  There is no pleural or pericardial effusion.  There is a 9 mm hypodensity within the left hepatic lobe, image 24/series 2.  Also within the left hepatic lobe is a 1.7 cm hypodensity, image 16/series 2.  Previous cholecystectomy.  There is no biliary dilatation.  The pancreas is unremarkable.  Normal appearance of the spleen.  The adrenal glands are negative.  There are multiple areas of scarring involving the right kidney.  No right-sided nephrolithiasis or obstructive uropathy.  The left kidney is normal.  The urinary bladder is normal.  Previous hysterectomy.  Calcified atherosclerotic disease affects the abdominal aorta and its branches.  There is no aneurysm.  There is no adenopathy identified within the upper abdomen.  No pelvic or inguinal adenopathy noted.  The stomach is within normal limits.  There is a lipoma noted within the duodenum.  This measures this appears to be on a long stalk measuring 2.4 cm. The small bowel loops appear normal. Normal appearance of the proximal colon.  There are scattered distal colonic diverticula without acute inflammation.  No free fluid or abnormal fluid collections identified within the upper abdomen or the pelvis.  Review of the visualized osseous structures is significant for osteopenia and mild degenerative disc disease  IMPRESSION:  1.  No acute findings identified within the abdomen  or pelvis. 2.  Duodenal lipoma. 3.  Prior cholecystectomy. 4.  Prior hysterectomy. 5.  Calcified atherosclerotic disease.   Original Report Authenticated By: Signa Kell, M.D.    Dg Chest 2 View  10/16/2012  *RADIOLOGY REPORT*  Clinical Data: Fever, hypertension.  CHEST - 2 VIEW  Comparison: 10/15/2012  Findings: Rounded density in the medial right lung base again noted, stable, felt to represent prominent epicardial fat pad. Bibasilar atelectasis or scarring.  Heart is normal size.  No effusions.  No acute bony abnormality.  IMPRESSION: Bibasilar atelectasis or scarring.  No change.   Original Report Authenticated By: Charlett Nose, M.D.    Dg Chest Portable 1  View  10/16/2012  *RADIOLOGY REPORT*  Clinical Data: Nausea and shortness of breath  PORTABLE CHEST - 1 VIEW  Comparison: 06/24/2010  Findings: Artifact over the medial right upper lobe.  Patient rotated right. Normal heart size.  Right hemidiaphragm eventration laterally. No pleural effusion or pneumothorax. Diffuse peribronchial thickening.  There is increased density at the medial right lung base.  This is similar to the configuration on the baseline exam of 11/26/2007.  Resolved right upper lobe airspace disease since that study.  IMPRESSION: Medial right lung base opacity which especially when correlated with the 06/22/2010 CT, is favored to represent a prominent epicardial fat pad.  Peribronchial thickening which may relate to chronic bronchitis or smoking. No convincing evidence of acute pneumonia.   Original Report Authenticated By: Jeronimo Greaves, M.D.     Micro Results     Recent Results (from the past 240 hour(s))  URINE CULTURE     Status: None   Collection Time    10/16/12  2:00 AM      Result Value Range Status   Specimen Description URINE, CATHETERIZED   Final   Special Requests Normal   Final   Culture  Setup Time 10/16/2012 08:47   Final   Colony Count 30,000 COLONIES/ML   Final   Culture Culture reincubated for better  growth   Final   Report Status 10/18/2012 FINAL   Final  CULTURE, BLOOD (ROUTINE X 2)     Status: None   Collection Time    10/16/12  2:25 AM      Result Value Range Status   Specimen Description BLOOD RH   Final   Special Requests Normal BOTTLES DRAWN AEROBIC AND ANAEROBIC 4CC   Final   Culture  Setup Time 10/16/2012 08:44   Final   Culture     Final   Value:        BLOOD CULTURE RECEIVED NO GROWTH TO DATE CULTURE WILL BE HELD FOR 5 DAYS BEFORE ISSUING A FINAL NEGATIVE REPORT   Report Status PENDING   Incomplete  CULTURE, BLOOD (ROUTINE X 2)     Status: None   Collection Time    10/16/12  2:25 AM      Result Value Range Status   Specimen Description BLOOD LH   Final   Special Requests Normal BOTTLES DRAWN AEROBIC AND ANAEROBIC 4CC   Final   Culture  Setup Time 10/16/2012 08:44   Final   Culture     Final   Value:        BLOOD CULTURE RECEIVED NO GROWTH TO DATE CULTURE WILL BE HELD FOR 5 DAYS BEFORE ISSUING A FINAL NEGATIVE REPORT   Report Status PENDING   Incomplete     CBC w Diff: Lab Results  Component Value Date   WBC 9.4 10/18/2012   HGB 10.6* 10/18/2012   HCT 30.3* 10/18/2012   PLT 291 10/18/2012   LYMPHOPCT 4* 10/16/2012   MONOPCT 1* 10/16/2012   EOSPCT 2 10/16/2012   BASOPCT 0 10/16/2012    CMP: Lab Results  Component Value Date   NA 134* 10/18/2012   K 3.8 10/18/2012   CL 103 10/18/2012   CO2 22 10/18/2012   BUN 15 10/18/2012   CREATININE 1.05 10/18/2012   CREATININE 1.01 04/07/2012   PROT 6.0 10/18/2012   ALBUMIN 3.1* 10/18/2012   BILITOT 0.2* 10/18/2012   ALKPHOS 39 10/18/2012   AST 34 10/18/2012   ALT 22 10/18/2012  .   Discharge Instructions  Follow with Primary MD Carollee Herter, MD in 4 days   Get CBC, CMP, checked 4 days by Primary MD and again as instructed by your Primary MD. Get a 2 view Chest X ray done next visit.  Get Medicines reviewed and adjusted.  Please request your Prim.MD to go over all Hospital Tests and Procedure/Radiological results  at the follow up, please get all Hospital records sent to your Prim MD by signing hospital release before you go home.  Activity: As tolerated with Full fall precautions use walker/cane & assistance as needed   Diet:  Heart Healthy  For Heart failure patients - Check your Weight same time everyday, if you gain over 2 pounds, or you develop in leg swelling, experience more shortness of breath or chest pain, call your Primary MD immediately. Follow Cardiac Low Salt Diet and 1.8 lit/day fluid restriction.  Disposition Home    If you experience worsening of your admission symptoms, develop shortness of breath, life threatening emergency, suicidal or homicidal thoughts you must seek medical attention immediately by calling 911 or calling your MD immediately  if symptoms less severe.  You Must read complete instructions/literature along with all the possible adverse reactions/side effects for all the Medicines you take and that have been prescribed to you. Take any new Medicines after you have completely understood and accpet all the possible adverse reactions/side effects.   Do not drive and provide baby sitting services if your were admitted for syncope or siezures until you have seen by Primary MD or a Neurologist and advised to do so again.  Do not drive when taking Pain medications.    Do not take more than prescribed Pain, Sleep and Anxiety Medications  Special Instructions: If you have smoked or chewed Tobacco  in the last 2 yrs please stop smoking, stop any regular Alcohol  and or any Recreational drug use.  Wear Seat belts while driving.  Follow-up Information   Follow up with Carollee Herter, MD. Schedule an appointment as soon as possible for a visit in 4 days.   Contact information:   [address] [place] [address] [phone]       Follow up with Stan Head, MD. Schedule an appointment as soon as possible for a visit in 1 week. (for liver and duodenum  findings on CT scan)    Contact information:   [address] [place] [address] [phone]         Discharge Medications     Medication List    STOP taking these medications       sulfamethoxazole-trimethoprim 800-160 MG per tablet  Commonly known as:  BACTRIM DS,SEPTRA DS      TAKE these medications       albuterol 108 (90 BASE) MCG/ACT inhaler  Commonly known as:  PROVENTIL HFA;VENTOLIN HFA  Inhale 2 puffs into the lungs 5 (five) times daily.     alendronate 70 MG tablet  Commonly known as:  FOSAMAX  Take 70 mg by mouth every 7 (seven) days. Take with a full glass of water on an empty stomach. On thursdays     beclomethasone 80 MCG/ACT inhaler  Commonly known as:  QVAR  Inhale 4 puffs into the lungs at bedtime.     doxycycline 100 MG capsule  Commonly known as:  VIBRAMYCIN  Take 1 capsule (100 mg total) by mouth 2 (two) times daily.     DSS 100 MG Caps  Take 200 mg by mouth 2 (two) times  daily as needed for constipation.     levothyroxine 75 MCG tablet  Commonly known as:  SYNTHROID, LEVOTHROID  Take 1 tablet (75 mcg total) by mouth daily.     lisinopril-hydrochlorothiazide 10-12.5 MG per tablet  Commonly known as:  PRINZIDE,ZESTORETIC  Take 1 tablet by mouth daily.     omeprazole 40 MG capsule  Commonly known as:  PRILOSEC  Take 1 capsule (40 mg total) by mouth daily.     ondansetron 4 MG tablet  Commonly known as:  ZOFRAN  Take 4 mg by mouth every 6 (six) hours.     predniSONE 5 MG tablet  Commonly known as:  DELTASONE  Take 1 tablet (5 mg total) by mouth daily. Label  & dispense according to the schedule below. 6 Pills PO for 3 days, 4 Pills PO for 3 days, 2 Pills PO for 3 days, 1 Pills PO for 3 days, 1/2 Pill  PO for 3 days then STOP.     simvastatin 40 MG tablet  Commonly known as:  ZOCOR  Take 40 mg by mouth every morning.           Total Time in preparing paper work, data evaluation and todays exam - 35 minutes  Leroy Sea M.D on 10/18/2012 at 9:47 AM  [facility] Office  [phone]

## 2012-10-18 NOTE — Progress Notes (Signed)
Patient discharged home with family, discharge instructions given and explained to patient and family and she verbalized understanding, denies any pain/distress. No distress noted, patient oxygen level on room air while at rest 95%-RA, with activity between 94%-89%, Dr. Thedore Mins notified, no need for home oxygen, No open wound skin intact with some rashes on arms from reaction. F/U outpatient with PCP. Patient accompanied home by family.

## 2012-10-19 ENCOUNTER — Telehealth: Payer: Self-pay | Admitting: Internal Medicine

## 2012-10-19 NOTE — Telephone Encounter (Signed)
Called back to advice [place] that per discharge instructions she was given she needs to be seen in 1wk for duodenal findings on CT

## 2012-10-19 NOTE — Telephone Encounter (Signed)
Patient was admitted to the hospital this week for COPD, she had a CT that showed a hypodensity in the liver and a duodenal lipoma.  She has no GI histroy here, but has need Dr. Loreta Ave in the past.  Her D/C instructions say to follow up on CT findings with Dr. Leone Payor.  I explained to her daughter in law she needs to see her primary care this week to follow up on her COPD.  She is advised that the next new patient appt with Dr. Leone Payor is in May.  She was given the option to schedule here for May or contact Dr. Kenna Gilbert office as she has history.  She will start with Dr. Susann Givens and call back if she decides to schedule an appt here.

## 2012-10-20 ENCOUNTER — Encounter: Payer: Self-pay | Admitting: Family Medicine

## 2012-10-20 ENCOUNTER — Telehealth: Payer: Self-pay | Admitting: Internal Medicine

## 2012-10-20 ENCOUNTER — Ambulatory Visit (INDEPENDENT_AMBULATORY_CARE_PROVIDER_SITE_OTHER): Payer: Medicare Other | Admitting: Family Medicine

## 2012-10-20 VITALS — BP 130/80 | HR 76 | Wt 155.0 lb

## 2012-10-20 DIAGNOSIS — R935 Abnormal findings on diagnostic imaging of other abdominal regions, including retroperitoneum: Secondary | ICD-10-CM

## 2012-10-20 DIAGNOSIS — N390 Urinary tract infection, site not specified: Secondary | ICD-10-CM

## 2012-10-20 DIAGNOSIS — R9389 Abnormal findings on diagnostic imaging of other specified body structures: Secondary | ICD-10-CM

## 2012-10-20 DIAGNOSIS — R918 Other nonspecific abnormal finding of lung field: Secondary | ICD-10-CM

## 2012-10-20 LAB — CBC WITH DIFFERENTIAL/PLATELET
Basophils Absolute: 0 10*3/uL (ref 0.0–0.1)
Basophils Relative: 0 % (ref 0–1)
Eosinophils Relative: 1 % (ref 0–5)
Lymphocytes Relative: 27 % (ref 12–46)
MCHC: 33.5 g/dL (ref 30.0–36.0)
MCV: 86.9 fL (ref 78.0–100.0)
Neutro Abs: 5.3 10*3/uL (ref 1.7–7.7)
Platelets: 427 10*3/uL — ABNORMAL HIGH (ref 150–400)
RDW: 14.3 % (ref 11.5–15.5)
WBC: 8.5 10*3/uL (ref 4.0–10.5)

## 2012-10-20 LAB — COMPREHENSIVE METABOLIC PANEL
ALT: 24 U/L (ref 0–35)
AST: 23 U/L (ref 0–37)
Alkaline Phosphatase: 38 U/L — ABNORMAL LOW (ref 39–117)
BUN: 15 mg/dL (ref 6–23)
Calcium: 9.3 mg/dL (ref 8.4–10.5)
Chloride: 101 mEq/L (ref 96–112)
Creat: 0.99 mg/dL (ref 0.50–1.10)

## 2012-10-20 NOTE — Telephone Encounter (Signed)
Patient has history with Dr. Loreta Ave.  I have offered an appt for 11/11/12.  I have discussed with Dr. Leone Payor the patient is asymptomatic and this is an incidental finding and not emergent.  Dr. Molinda Bailiff office said they will refer to Dr. Kenna Gilbert office

## 2012-10-20 NOTE — Telephone Encounter (Signed)
Dr. Jola Babinski nurse Lavonna Rua called and stated that Dr. Susann Givens said patient needed to be seen before the May 28th that was offered.  Please call Sheri at [phone]

## 2012-10-20 NOTE — Progress Notes (Signed)
  Subjective:    Patient ID: Laurie Ward, female    DOB: 18-Oct-1926, 79 y.o.   MRN: [id number]  HPI Her for a recheck after recent hospitalization. The record was reviewed. Chest x-ray did show atelectasis. Also CT scan of abdomen did show what appears to be a lipoma as well as a small lesion in her liver. At this time she is only complaining of fatigue but no fever, chills, cough or congestion. Presently she is taking doxycycline.   Review of Systems     Objective:   Physical Exam Alert and in no distress. TMs normal. Throat is clear. Neck is supple without adenopathy. Lungs show rhonchi in the right midlung field.       Assessment & Plan:  Abnormal CT of the abdomen - Plan: CBC with Differential, Comprehensive metabolic panel, Ambulatory referral to Gastroenterology  Abnormal chest x-ray - Plan: CBC with Differential, Comprehensive metabolic panel, DG Chest 2 View  UTI (lower urinary tract infection) I will followup with blood work as well as for further to GI for further consultation concerning abnormal CT scan. Chest x-ray will be ordered for several days.

## 2012-10-21 NOTE — Progress Notes (Signed)
Quick Note:  Pt husband informed and verbalized understanding ______

## 2012-10-22 LAB — CULTURE, BLOOD (ROUTINE X 2): Culture: NO GROWTH

## 2012-10-26 ENCOUNTER — Ambulatory Visit
Admission: RE | Admit: 2012-10-26 | Discharge: 2012-10-26 | Disposition: A | Discharge status: Home / Self Care | Payer: Medicare Other | Source: Ambulatory Visit | Attending: Family Medicine | Admitting: Family Medicine

## 2012-10-26 DIAGNOSIS — R9389 Abnormal findings on diagnostic imaging of other specified body structures: Secondary | ICD-10-CM

## 2012-11-13 ENCOUNTER — Encounter: Payer: Self-pay | Admitting: Internal Medicine

## 2012-11-25 ENCOUNTER — Encounter: Payer: Self-pay | Admitting: Family Medicine

## 2012-11-25 ENCOUNTER — Ambulatory Visit (INDEPENDENT_AMBULATORY_CARE_PROVIDER_SITE_OTHER): Payer: Medicare Other | Admitting: Family Medicine

## 2012-11-25 VITALS — BP 140/86 | HR 73 | Wt 155.0 lb

## 2012-11-25 DIAGNOSIS — J449 Chronic obstructive pulmonary disease, unspecified: Secondary | ICD-10-CM

## 2012-11-25 DIAGNOSIS — Z87440 Personal history of urinary (tract) infections: Secondary | ICD-10-CM

## 2012-11-25 LAB — POCT URINALYSIS DIPSTICK
Bilirubin, UA: NEGATIVE
Blood, UA: NEGATIVE
Glucose, UA: NEGATIVE
Ketones, UA: NEGATIVE
Spec Grav, UA: <=1.005
Urobilinogen, UA: NEGATIVE

## 2012-11-25 NOTE — Progress Notes (Signed)
  Subjective:    Patient ID: Laurie Ward, female    DOB: Jun 02, 1927, 79 y.o.   MRN: [id number]  HPI She is here for recheck. She was hospitalized for COPD in also found to have a UTI. There is also issues of possible liver trouble. She is scheduled for a procedure in mid July to evaluate this. She is having no difficulty with fever, chills, urinary symptoms.   Review of Systems     Objective:   Physical Exam Alert and in no distress. Cardiac exam shows regular rhythm without murmurs or gallops. Lungs are clear to auscultation. Urinalysis was normal      Assessment & Plan:  History of UTI - Plan: Urinalysis Dipstick  COPD (chronic obstructive pulmonary disease) she will return here on an as-needed basis.

## 2012-12-17 ENCOUNTER — Telehealth: Payer: Self-pay

## 2012-12-17 NOTE — Telephone Encounter (Signed)
CALLED PT ANOTHER DR.HAS TAKEN CARE OF THIS

## 2013-02-13 ENCOUNTER — Other Ambulatory Visit: Payer: Self-pay | Admitting: Family Medicine

## 2013-03-10 ENCOUNTER — Encounter: Payer: Self-pay | Admitting: Internal Medicine

## 2013-04-16 ENCOUNTER — Other Ambulatory Visit: Payer: Self-pay | Admitting: Family Medicine

## 2013-04-26 ENCOUNTER — Other Ambulatory Visit: Payer: Self-pay | Admitting: Family Medicine

## 2013-09-09 ENCOUNTER — Telehealth: Payer: Self-pay | Admitting: Internal Medicine

## 2013-09-09 MED ORDER — ALENDRONATE SODIUM 70 MG PO TABS: 70.0000 mg | ORAL_TABLET | ORAL | Status: DC

## 2013-09-09 MED ORDER — LEVOTHYROXINE SODIUM 75 MCG PO TABS: ORAL_TABLET | ORAL | Status: DC

## 2013-09-09 MED ORDER — OMEPRAZOLE 40 MG PO CPDR: DELAYED_RELEASE_CAPSULE | ORAL | Status: DC

## 2013-09-09 MED ORDER — LISINOPRIL-HYDROCHLOROTHIAZIDE 10-12.5 MG PO TABS: ORAL_TABLET | ORAL | Status: DC

## 2013-09-09 MED ORDER — SIMVASTATIN 40 MG PO TABS: 40.0000 mg | ORAL_TABLET | Freq: Every morning | ORAL | Status: DC

## 2013-09-09 NOTE — Telephone Encounter (Signed)
Refill request for 90 day supply to [facility] [address] for simvastatin 40mg , levothyroxine 75mcg, omeprazole 40mg , lisinopril-hctz 10-12.5mg , alendronate 70mg 

## 2013-09-09 NOTE — Telephone Encounter (Signed)
Her medications were renewed however she will need an appointment before further medication renewal.

## 2013-09-09 NOTE — Telephone Encounter (Signed)
Are these ok to refill? 

## 2013-09-09 NOTE — Telephone Encounter (Signed)
N call the medication however she will need a med check appointment before any more renewals. She was last seen in June.

## 2013-09-13 NOTE — Telephone Encounter (Signed)
Pt scheduled for 10/07/13 at 9:15am

## 2013-10-07 ENCOUNTER — Ambulatory Visit (INDEPENDENT_AMBULATORY_CARE_PROVIDER_SITE_OTHER): Payer: Medicare Other | Admitting: Family Medicine

## 2013-10-07 ENCOUNTER — Encounter: Payer: Self-pay | Admitting: Family Medicine

## 2013-10-07 ENCOUNTER — Telehealth: Payer: Self-pay | Admitting: Medical

## 2013-10-07 VITALS — BP 120/70 | HR 60 | Wt 154.0 lb

## 2013-10-07 DIAGNOSIS — Z79899 Other long term (current) drug therapy: Secondary | ICD-10-CM

## 2013-10-07 DIAGNOSIS — J449 Chronic obstructive pulmonary disease, unspecified: Secondary | ICD-10-CM

## 2013-10-07 DIAGNOSIS — E785 Hyperlipidemia, unspecified: Secondary | ICD-10-CM

## 2013-10-07 DIAGNOSIS — E039 Hypothyroidism, unspecified: Secondary | ICD-10-CM

## 2013-10-07 DIAGNOSIS — M810 Age-related osteoporosis without current pathological fracture: Secondary | ICD-10-CM

## 2013-10-07 DIAGNOSIS — I10 Essential (primary) hypertension: Secondary | ICD-10-CM

## 2013-10-07 LAB — CBC WITH DIFFERENTIAL/PLATELET
Basophils Absolute: 0.1 10*3/uL (ref 0.0–0.1)
Basophils Relative: 1 % (ref 0–1)
EOS ABS: 0.2 10*3/uL (ref 0.0–0.7)
EOS PCT: 3 % (ref 0–5)
HCT: 40.2 % (ref 36.0–46.0)
Hemoglobin: 13.9 g/dL (ref 12.0–15.0)
LYMPHS ABS: 2.6 10*3/uL (ref 0.7–4.0)
LYMPHS PCT: 34 % (ref 12–46)
MCH: 30.5 pg (ref 26.0–34.0)
MCHC: 34.6 g/dL (ref 30.0–36.0)
MCV: 88.2 fL (ref 78.0–100.0)
MONOS PCT: 8 % (ref 3–12)
Monocytes Absolute: 0.6 10*3/uL (ref 0.1–1.0)
Neutro Abs: 4.1 10*3/uL (ref 1.7–7.7)
Neutrophils Relative %: 54 % (ref 43–77)
Platelets: 344 10*3/uL (ref 150–400)
RBC: 4.56 MIL/uL (ref 3.87–5.11)
RDW: 14.3 % (ref 11.5–15.5)
WBC: 7.5 10*3/uL (ref 4.0–10.5)

## 2013-10-07 LAB — COMPREHENSIVE METABOLIC PANEL
ALT: 16 U/L (ref 0–35)
AST: 18 U/L (ref 0–37)
Albumin: 4.5 g/dL (ref 3.5–5.2)
Alkaline Phosphatase: 35 U/L — ABNORMAL LOW (ref 39–117)
BILIRUBIN TOTAL: 0.6 mg/dL (ref 0.2–1.2)
BUN: 16 mg/dL (ref 6–23)
CHLORIDE: 103 meq/L (ref 96–112)
CO2: 28 mEq/L (ref 19–32)
Calcium: 9.7 mg/dL (ref 8.4–10.5)
Creat: 0.88 mg/dL (ref 0.50–1.10)
GLUCOSE: 100 mg/dL — AB (ref 70–99)
Potassium: 4 mEq/L (ref 3.5–5.3)
SODIUM: 138 meq/L (ref 135–145)
Total Protein: 7.5 g/dL (ref 6.0–8.3)

## 2013-10-07 LAB — LIPID PANEL
CHOL/HDL RATIO: 4.3 ratio
CHOLESTEROL: 192 mg/dL (ref 0–200)
HDL: 45 mg/dL (ref >39)
LDL Cholesterol: 115 mg/dL — ABNORMAL HIGH (ref 0–99)
Triglycerides: 162 mg/dL — ABNORMAL HIGH (ref <150)
VLDL: 32 mg/dL (ref 0–40)

## 2013-10-07 LAB — TSH: TSH: 1.741 u[IU]/mL (ref 0.350–4.500)

## 2013-10-07 MED ORDER — LISINOPRIL-HYDROCHLOROTHIAZIDE 10-12.5 MG PO TABS: ORAL_TABLET | ORAL | Status: DC

## 2013-10-07 MED ORDER — ALENDRONATE SODIUM 70 MG PO TABS: 70.0000 mg | ORAL_TABLET | ORAL | Status: DC

## 2013-10-07 MED ORDER — SIMVASTATIN 40 MG PO TABS: 40.0000 mg | ORAL_TABLET | Freq: Every morning | ORAL | Status: DC

## 2013-10-07 MED ORDER — LEVOTHYROXINE SODIUM 75 MCG PO TABS: ORAL_TABLET | ORAL | Status: DC

## 2013-10-07 NOTE — Progress Notes (Deleted)
   Subjective:    Patient ID: Laurie Ward, female    DOB: 06/06/1927, 80 y.o.   MRN: [id number]  HPI  Laurie Ward is a 80 yo female with PMH significant for COPD, hypertension, hypothyroidism and osteoporosis who presents today for a med check.   Cataract surgery last week on the left eye Overall the patient is doing well, and she reports that her breathing is doing much better and that she feels well overall. The patient reports that regarding her diet "I eat anything I want." The patient doesn't like healthy foods and feels that anything green makes her "bowel troubles" worse. She is not interested at this time in trying to change her diet. The patient also states that "I'm too old to exercise." She does try to walk everyday, sometimes for up to 2 hours, but feels tired easily.   The patient quit smoking 20 years ago and does not drink alcohol.   Review of Systems is negative except per HPI.    Objective:   Physical Exam  Cardiac: Normal S1 & S2, regular rate and rhythm Pulmonary: CTAB w/o crackles  Patient's record indicate that the patient's last DEXA scan was in Nov. 2012 and demonstrated that her bone density was below -2.5. The patient has been on Fosamax since November 2012.     Assessment & Plan:   COPD (chronic obstructive pulmonary disease)  Hypertension - Plan: lisinopril-hydrochlorothiazide (PRINZIDE,ZESTORETIC) 10-12.5 MG per tablet, CBC with Differential, Comprehensive metabolic panel  Hypothyroid - Plan: levothyroxine (SYNTHROID, LEVOTHROID) 75 MCG tablet, TSH  Osteoporosis - Plan: DG Bone Density, alendronate (FOSAMAX) 70 MG tablet  Hyperlipidemia LDL goal < 130 - Plan: simvastatin (ZOCOR) 40 MG tablet, Lipid panel  Encounter for long-term (current) use of other medications - Plan: CBC with Differential, Comprehensive metabolic panel  1) COPD: Reviewed the patietn's clinical state and reminded her to come in if she notes any acute worsening of her COPD,  i.e. Using her inhaler more the normal. 2) Hypothyroid: will recheck the patients TSH and free T4 today 3) Osteoporosis: will recheck a DEXA scan to assess patient's BMD, will consider discontinuing aldronate after 5 years (Nov, 2017) if the BMD demonstrates none or only slight improvements. 4) Hyperlipidemia: Continue simvastatin 5) Medications: The patient will need to return in one week for a medication re-check. There was some confusion as to what and how often she was taking her medications. The patient should bring in all of her medications on her next visit, this includes all vitamins or other supplements she may be taking.

## 2013-10-07 NOTE — Telephone Encounter (Signed)
I saw patient's husband today and he requested a medication for her for dementia.  She sees you.  He states that she seems to be forgetting things more more, forgets where she puts things, seems absent minded more often, seems to be having a harder time with memory.  He feels that she needs something to help with this. I told them out pass this information along to you.  Not sure if she has seen you for this

## 2013-10-07 NOTE — Progress Notes (Signed)
   Subjective:    Patient ID: Laurie Ward, female    DOB: 1927-06-22, 80 y.o.   MRN: [id number]  HPI  Ms. Colan NeptuneGloria Muntean is a 80 yo female with PMH significant for COPD, hypertension, hypothyroidism and osteoporosis who presents today for a med check.   Cataract surgery last week on the left eye Overall the patient is doing well, and she reports that her breathing is doing much better and that she feels well overall. The patient reports that regarding her diet "I eat anything I want." The patient doesn't like healthy foods and feels that anything green makes her "bowel troubles" worse. She is not interested at this time in trying to change her diet. The patient also states that "I'm too old to exercise." She does try to walk everyday, sometimes for up to 2 hours, but feels tired easily. When asked about her medications she says she is taking them but is really unsure exactly which ones she is on.  The patient quit smoking 20 years ago and does not drink alcohol.   Review of Systems is negative except per HPI.    Objective:   Physical Exam Alert and in no distress.  Cardiac: Normal S1 & S2, regular rate and rhythm Pulmonary: CTAB w/o crackles  Patient's record indicate that the patient's last DEXA scan was in Nov. 2012 and demonstrated that her bone density was below -2.5. The patient has been on Fosamax since November 2012.     Assessment & Plan:   COPD (chronic obstructive pulmonary disease)  Hypertension - Plan: lisinopril-hydrochlorothiazide (PRINZIDE,ZESTORETIC) 10-12.5 MG per tablet, CBC with Differential, Comprehensive metabolic panel  Hypothyroid - Plan: levothyroxine (SYNTHROID, LEVOTHROID) 75 MCG tablet, TSH  Osteoporosis - Plan: DG Bone Density, alendronate (FOSAMAX) 70 MG tablet  Hyperlipidemia LDL goal < 130 - Plan: simvastatin (ZOCOR) 40 MG tablet, Lipid panel  Encounter for long-term (current) use of other medications - Plan: CBC with Differential, Comprehensive  metabolic panel  1) COPD: Reviewed the patietn's clinical state and reminded her to come in if she notes any acute worsening of her COPD, i.e. Using her inhaler more the normal. 2) Hypothyroid: will recheck the patients TSH and free T4 today 3) Osteoporosis: will recheck a DEXA scan to assess patient's BMD, will consider discontinuing aldronate after 5 years (Nov, 2017) if the BMD demonstrates none or only slight improvements. 4) Hyperlipidemia: Continue simvastatin 5) Medications: The patient will need to return in one week for a medication re-check. There was some confusion as to what and how often she was taking her medications. The patient should bring in all of her medications on her next visit, this includes all vitamins or other supplements she may be taking.

## 2013-10-07 NOTE — Patient Instructions (Signed)
Try cutting back on the omeprazole to every other day and potentially every third day to control your acid indigestion

## 2013-10-14 ENCOUNTER — Encounter: Payer: Self-pay | Admitting: Family Medicine

## 2013-10-14 ENCOUNTER — Ambulatory Visit (INDEPENDENT_AMBULATORY_CARE_PROVIDER_SITE_OTHER): Payer: Medicare Other | Admitting: Family Medicine

## 2013-10-14 VITALS — BP 132/82 | HR 76 | Wt 155.0 lb

## 2013-10-14 DIAGNOSIS — R413 Other amnesia: Secondary | ICD-10-CM

## 2013-10-14 DIAGNOSIS — G3184 Mild cognitive impairment, so stated: Secondary | ICD-10-CM

## 2013-10-14 MED ORDER — DONEPEZIL HCL 5 MG PO TABS: 5.0000 mg | ORAL_TABLET | Freq: Every day | ORAL | Status: DC

## 2013-10-14 NOTE — Progress Notes (Signed)
   Subjective:    Patient ID: Laurie Ward, female    DOB: [birth year], 80 y.o.   MRN: [id number]  HPI She is here for consult concerning memory issues for the last year. She does note that she is having increasing difficulty with memory. She states that she can be in the middle of a sentence and forget what she is talking about. She has not had any trouble driving and forgetting where she was driving. She does lay things down and then forget where they are. Her husband is also noted this. Some of her friends that she plays cards with all seem to have somewhat issues. She has not noted any difficulty with her ADLs. She offers no complaints concerning depression.   Review of Systems     Objective:   Physical Exam Alert and in no distress. MMSE is 25. Recent blood work was reviewed.      Assessment & Plan:  Mild cognitive impairment with memory loss - Plan: donepezil (ARICEPT) 5 MG tablet  I discussed the diagnosis of mild cognitive impairment with her. She does express understanding of this. I will start her on Aricept at 5 mg. She is to return here in one month for recheck.

## 2013-11-22 ENCOUNTER — Ambulatory Visit (INDEPENDENT_AMBULATORY_CARE_PROVIDER_SITE_OTHER): Payer: Medicare Other | Admitting: Family Medicine

## 2013-11-22 ENCOUNTER — Encounter: Payer: Self-pay | Admitting: Family Medicine

## 2013-11-22 VITALS — BP 140/90 | HR 60 | Wt 155.0 lb

## 2013-11-22 DIAGNOSIS — G3184 Mild cognitive impairment, so stated: Secondary | ICD-10-CM

## 2013-11-22 DIAGNOSIS — R413 Other amnesia: Secondary | ICD-10-CM

## 2013-11-22 MED ORDER — DONEPEZIL HCL 10 MG PO TABS: 10.0000 mg | ORAL_TABLET | Freq: Every day | ORAL | Status: DC

## 2013-11-22 NOTE — Progress Notes (Signed)
   Subjective:    Patient ID: Laurie Ward, female    DOB: October 14, 1926, 80 y.o.   MRN: [id number]  HPI She is here for recheck. She was started on Aricept and a little over a month ago. She has had no adverse side effects from this. She also notes no improvement in her cognitive abilities.  Review of Systems     Objective:   Physical Exam Alert and in no distress otherwise not examined       Assessment & Plan:  Mild cognitive impairment with memory loss - Plan: donepezil (ARICEPT) 10 MG tablet  I will increase her to 10 mg. She is to call me in roughly 2 weeks to see how she is doing. At that point I might possibly add Namenda to her regimen.

## 2013-11-22 NOTE — Patient Instructions (Signed)
I will increase your medication and I want to call me in about 2 weeks to let me know how you're doing.

## 2013-12-07 ENCOUNTER — Telehealth: Payer: Self-pay | Admitting: Family Medicine

## 2013-12-07 DIAGNOSIS — G3184 Mild cognitive impairment, so stated: Secondary | ICD-10-CM

## 2013-12-07 MED ORDER — MEMANTINE HCL 5 MG PO TABS: 5.0000 mg | ORAL_TABLET | Freq: Two times a day (BID) | ORAL | Status: DC

## 2013-12-07 MED ORDER — DONEPEZIL HCL 5 MG PO TABS: 5.0000 mg | ORAL_TABLET | Freq: Every day | ORAL | Status: DC

## 2013-12-07 NOTE — Telephone Encounter (Signed)
Tell her to stop the 10 mg. Let her know that I switched her back to the 5 mg of Aricept and added a new medication called Namenda. Have her recheck with me in one month.

## 2013-12-07 NOTE — Telephone Encounter (Signed)
She did not tolerate the 10 mg Aricept. I will drop her down to 5 mg which she tolerated and add Namenda. Recheck here in one month.

## 2013-12-07 NOTE — Telephone Encounter (Signed)
Pt.notified

## 2013-12-07 NOTE — Telephone Encounter (Signed)
Pt states she tried to take the pills you gave her.  She cannot take them, they give her weird dreams, she can not sleep, so she is not going to take these meds.  Please advise pt [phone]

## 2013-12-20 ENCOUNTER — Other Ambulatory Visit: Payer: Self-pay | Admitting: Family Medicine

## 2014-03-21 ENCOUNTER — Other Ambulatory Visit: Payer: Self-pay | Admitting: Family Medicine

## 2014-06-18 ENCOUNTER — Other Ambulatory Visit: Payer: Self-pay | Admitting: Family Medicine

## 2014-08-24 DIAGNOSIS — J209 Acute bronchitis, unspecified: Secondary | ICD-10-CM | POA: Diagnosis not present

## 2014-09-16 ENCOUNTER — Other Ambulatory Visit: Payer: Self-pay | Admitting: Family Medicine

## 2014-11-25 ENCOUNTER — Ambulatory Visit (INDEPENDENT_AMBULATORY_CARE_PROVIDER_SITE_OTHER): Payer: Medicare Other | Admitting: Family Medicine

## 2014-11-25 ENCOUNTER — Encounter: Payer: Self-pay | Admitting: Family Medicine

## 2014-11-25 VITALS — BP 112/74 | HR 70 | Wt 152.0 lb

## 2014-11-25 DIAGNOSIS — B029 Zoster without complications: Secondary | ICD-10-CM

## 2014-11-25 MED ORDER — VALACYCLOVIR HCL 1 G PO TABS: 1000.0000 mg | ORAL_TABLET | Freq: Three times a day (TID) | ORAL | Status: DC

## 2014-11-25 MED ORDER — HYDROCODONE-ACETAMINOPHEN 5-325 MG PO TABS: 1.0000 | ORAL_TABLET | Freq: Four times a day (QID) | ORAL | Status: DC | PRN

## 2014-11-25 NOTE — Patient Instructions (Addendum)

## 2014-11-25 NOTE — Progress Notes (Signed)
   Subjective:    Patient ID: Laurie Ward, female    DOB: May 11, 1927, 81 y.o.   MRN: [id number]  HPI She developed a rash on the right posterior upper back area 3-4 days ago. It is quite painful.   Review of Systems     Objective:   Physical Exam Erythema with vesicles present in the right T3 nerve root. Mainly erythema in the anterior portion.       Assessment & Plan:  Shingles - Plan: HYDROcodone-acetaminophen (NORCO) 5-325 MG per tablet, valACYclovir (VALTREX) 1000 MG tablet I discussed the diagnosis of shingles with her. I'll send her to avoid pregnant women and very young children. She will return here in several days for recheck to make sure no secondary infection. Also encouraged her to use Aleve 2 pills twice a day to help with the pain.

## 2014-11-28 ENCOUNTER — Ambulatory Visit: Payer: Self-pay | Admitting: Family Medicine

## 2014-12-01 ENCOUNTER — Ambulatory Visit (INDEPENDENT_AMBULATORY_CARE_PROVIDER_SITE_OTHER): Payer: Medicare Other | Admitting: Family Medicine

## 2014-12-01 DIAGNOSIS — B029 Zoster without complications: Secondary | ICD-10-CM

## 2014-12-01 NOTE — Progress Notes (Signed)
   Subjective:    Patient ID: Laurie Ward, female    DOB: 1926/10/03, 81 y.o.   MRN: [id number]  HPI She is here for follow-up. She does state that she is having much less pain and discomfort.   Review of Systems     Objective:   Physical Exam Exam of the right mid chest and back area does show healing lesions in the very linear pattern.       Assessment & Plan:  Shingles Encouraged her to take the rest of the Valtrex. Discussed PHN with her. He will call if she continues to have difficulty with the pain.

## 2014-12-19 ENCOUNTER — Other Ambulatory Visit: Payer: Self-pay | Admitting: Family Medicine

## 2015-01-03 ENCOUNTER — Encounter: Payer: Self-pay | Admitting: Family Medicine

## 2015-01-03 ENCOUNTER — Ambulatory Visit (INDEPENDENT_AMBULATORY_CARE_PROVIDER_SITE_OTHER): Payer: Medicare Other | Admitting: Family Medicine

## 2015-01-03 VITALS — BP 122/78 | HR 68 | Wt 151.0 lb

## 2015-01-03 DIAGNOSIS — K5900 Constipation, unspecified: Secondary | ICD-10-CM

## 2015-01-03 NOTE — Progress Notes (Signed)
   Subjective:    Patient ID: Laurie Ward, female    DOB: 04/02/1927, 81 y.o.   MRN: [id number]  HPI She complains of a two-week history of alternating constipation and diarrhea. She states that she can go 3 days without having a BM and then she will take a laxity of them have diarrhea for several days. She otherwise has not had any changes in her lifestyle, medications or activity habits. She does state she is having some bloating and gas and is uncomfortable with leaving the house because of that.   Review of Systems     Objective:   Physical Exam Alert and in no distress. Abdominal exam shows active bowel sounds without masses or tenderness.       Assessment & Plan:  Constipation, unspecified constipation type Information givn in the AVS Concerning constipation. Recommend fluids, bulk in diet, exercise. If she has further difficulty, I will give her a stool softener.

## 2015-01-03 NOTE — Patient Instructions (Signed)

## 2015-01-17 DIAGNOSIS — T7840XA Allergy, unspecified, initial encounter: Secondary | ICD-10-CM | POA: Diagnosis not present

## 2015-02-14 ENCOUNTER — Ambulatory Visit (INDEPENDENT_AMBULATORY_CARE_PROVIDER_SITE_OTHER): Payer: Medicare Other | Admitting: Family Medicine

## 2015-02-14 VITALS — BP 126/70 | HR 66 | Ht 62.0 in | Wt 150.0 lb

## 2015-02-14 DIAGNOSIS — K5900 Constipation, unspecified: Secondary | ICD-10-CM | POA: Diagnosis not present

## 2015-02-14 MED ORDER — POLYETHYLENE GLYCOL 3350 17 GM/SCOOP PO POWD: 17.0000 g | Freq: Every day | ORAL | Status: DC

## 2015-02-14 NOTE — Progress Notes (Signed)
   Subjective:    Patient ID: Laurie Ward, female    DOB: 24-Oct-1926, 81 y.o.   MRN: [id number]  HPI She is here again for consult concerning constipation and diarrhea. She was seen in July for this and given general measures to help with this. She did try Metamucil. She states that she can go 2 days without a BM and then will use no of magnesia stating it helps relieve the discomfort. She also has been having a lot of gas with this.   Review of Systems     Objective:   Physical Exam Alert and in no distress. Abdominal exam shows active bowel sounds without masses or tenderness.       Assessment & Plan:  Constipation, unspecified constipation type - Plan: polyethylene glycol powder (GLYCOLAX/MIRALAX) powder I again recommended fluid and we'll switch her to MiraLAX. Explained that it would take a week or 2 for this to settle down. If she continues to have difficulty, I will refer her to [facility]. Also recommended Gas-X for her excessive gas and pain attention to foods making this worse.

## 2015-02-14 NOTE — Patient Instructions (Signed)
1 scoop every day and make sure you drink plenty of fluids. The idea is to have softer bowel movements but they don't necessarily have to be every day. Use Gas-X regularly. There are certain foods that are more likely to cause gas and of course beans and sometimes even salads can do it. Do not use any more milk of magnesia

## 2015-02-17 ENCOUNTER — Other Ambulatory Visit: Payer: Medicare Other

## 2015-02-23 ENCOUNTER — Other Ambulatory Visit (INDEPENDENT_AMBULATORY_CARE_PROVIDER_SITE_OTHER): Payer: Medicare Other

## 2015-02-23 DIAGNOSIS — Z23 Encounter for immunization: Secondary | ICD-10-CM | POA: Diagnosis not present

## 2015-03-18 ENCOUNTER — Other Ambulatory Visit: Payer: Self-pay | Admitting: Family Medicine

## 2015-03-20 NOTE — Telephone Encounter (Signed)
Is this okay?

## 2015-03-31 DIAGNOSIS — H353131 Nonexudative age-related macular degeneration, bilateral, early dry stage: Secondary | ICD-10-CM | POA: Diagnosis not present

## 2015-04-29 DIAGNOSIS — R3915 Urgency of urination: Secondary | ICD-10-CM | POA: Diagnosis not present

## 2015-04-29 DIAGNOSIS — R300 Dysuria: Secondary | ICD-10-CM | POA: Diagnosis not present

## 2015-04-29 DIAGNOSIS — N3090 Cystitis, unspecified without hematuria: Secondary | ICD-10-CM | POA: Diagnosis not present

## 2015-05-15 DIAGNOSIS — H02834 Dermatochalasis of left upper eyelid: Secondary | ICD-10-CM | POA: Diagnosis not present

## 2015-05-15 DIAGNOSIS — H02831 Dermatochalasis of right upper eyelid: Secondary | ICD-10-CM | POA: Diagnosis not present

## 2015-06-09 DIAGNOSIS — N3091 Cystitis, unspecified with hematuria: Secondary | ICD-10-CM | POA: Diagnosis not present

## 2015-06-09 DIAGNOSIS — J0100 Acute maxillary sinusitis, unspecified: Secondary | ICD-10-CM | POA: Diagnosis not present

## 2015-06-09 DIAGNOSIS — N3001 Acute cystitis with hematuria: Secondary | ICD-10-CM | POA: Diagnosis not present

## 2015-06-09 DIAGNOSIS — J209 Acute bronchitis, unspecified: Secondary | ICD-10-CM | POA: Diagnosis not present

## 2015-06-27 DIAGNOSIS — J0100 Acute maxillary sinusitis, unspecified: Secondary | ICD-10-CM | POA: Diagnosis not present

## 2015-06-27 DIAGNOSIS — J209 Acute bronchitis, unspecified: Secondary | ICD-10-CM | POA: Diagnosis not present

## 2015-07-24 DIAGNOSIS — H02834 Dermatochalasis of left upper eyelid: Secondary | ICD-10-CM | POA: Diagnosis not present

## 2015-07-24 DIAGNOSIS — H02831 Dermatochalasis of right upper eyelid: Secondary | ICD-10-CM | POA: Diagnosis not present

## 2015-08-10 DIAGNOSIS — J209 Acute bronchitis, unspecified: Secondary | ICD-10-CM | POA: Diagnosis not present

## 2015-08-10 DIAGNOSIS — J0100 Acute maxillary sinusitis, unspecified: Secondary | ICD-10-CM | POA: Diagnosis not present

## 2015-12-17 ENCOUNTER — Other Ambulatory Visit: Payer: Self-pay | Admitting: Family Medicine

## 2016-02-02 ENCOUNTER — Encounter: Payer: Self-pay | Admitting: Family Medicine

## 2016-02-02 ENCOUNTER — Ambulatory Visit (INDEPENDENT_AMBULATORY_CARE_PROVIDER_SITE_OTHER): Payer: Medicare Other | Admitting: Family Medicine

## 2016-02-02 VITALS — BP 112/70 | HR 68 | Ht 61.0 in | Wt 141.0 lb

## 2016-02-02 DIAGNOSIS — K5900 Constipation, unspecified: Secondary | ICD-10-CM | POA: Diagnosis not present

## 2016-02-02 DIAGNOSIS — I10 Essential (primary) hypertension: Secondary | ICD-10-CM

## 2016-02-02 DIAGNOSIS — E785 Hyperlipidemia, unspecified: Secondary | ICD-10-CM | POA: Diagnosis not present

## 2016-02-02 DIAGNOSIS — Z23 Encounter for immunization: Secondary | ICD-10-CM

## 2016-02-02 DIAGNOSIS — R5382 Chronic fatigue, unspecified: Secondary | ICD-10-CM | POA: Diagnosis not present

## 2016-02-02 DIAGNOSIS — Z0000 Encounter for general adult medical examination without abnormal findings: Secondary | ICD-10-CM

## 2016-02-02 DIAGNOSIS — K219 Gastro-esophageal reflux disease without esophagitis: Secondary | ICD-10-CM | POA: Diagnosis not present

## 2016-02-02 DIAGNOSIS — E039 Hypothyroidism, unspecified: Secondary | ICD-10-CM | POA: Diagnosis not present

## 2016-02-02 DIAGNOSIS — G479 Sleep disorder, unspecified: Secondary | ICD-10-CM

## 2016-02-02 LAB — LIPID PANEL
CHOLESTEROL: 170 mg/dL (ref 125–200)
HDL: 53 mg/dL (ref >=46)
LDL CALC: 87 mg/dL (ref <130)
Total CHOL/HDL Ratio: 3.2 Ratio (ref <=5.0)
Triglycerides: 151 mg/dL — ABNORMAL HIGH (ref <150)
VLDL: 30 mg/dL (ref <30)

## 2016-02-02 LAB — CBC WITH DIFFERENTIAL/PLATELET
BASOS PCT: 0 %
Basophils Absolute: 0 cells/uL (ref 0–200)
Eosinophils Absolute: 308 cells/uL (ref 15–500)
Eosinophils Relative: 4 %
HCT: 37.6 % (ref 35.0–45.0)
Hemoglobin: 12.3 g/dL (ref 11.7–15.5)
LYMPHS PCT: 32 %
Lymphs Abs: 2464 cells/uL (ref 850–3900)
MCH: 29.9 pg (ref 27.0–33.0)
MCHC: 32.7 g/dL (ref 32.0–36.0)
MCV: 91.5 fL (ref 80.0–100.0)
MONOS PCT: 8 %
MPV: 9.3 fL (ref 7.5–12.5)
Monocytes Absolute: 616 cells/uL (ref 200–950)
Neutro Abs: 4312 cells/uL (ref 1500–7800)
Neutrophils Relative %: 56 %
PLATELETS: 373 10*3/uL (ref 140–400)
RBC: 4.11 MIL/uL (ref 3.80–5.10)
RDW: 13.9 % (ref 11.0–15.0)
WBC: 7.7 10*3/uL (ref 4.0–10.5)

## 2016-02-02 LAB — COMPREHENSIVE METABOLIC PANEL
ALT: 12 U/L (ref 6–29)
AST: 20 U/L (ref 10–35)
Albumin: 4.1 g/dL (ref 3.6–5.1)
Alkaline Phosphatase: 38 U/L (ref 33–130)
BILIRUBIN TOTAL: 0.5 mg/dL (ref 0.2–1.2)
BUN: 14 mg/dL (ref 7–25)
CHLORIDE: 102 mmol/L (ref 98–110)
CO2: 24 mmol/L (ref 20–31)
CREATININE: 1.31 mg/dL — AB (ref 0.60–0.88)
Calcium: 9.8 mg/dL (ref 8.6–10.4)
Glucose, Bld: 92 mg/dL (ref 65–99)
Potassium: 4.1 mmol/L (ref 3.5–5.3)
SODIUM: 138 mmol/L (ref 135–146)
TOTAL PROTEIN: 6.8 g/dL (ref 6.1–8.1)

## 2016-02-02 LAB — TSH: TSH: 0.65 mIU/L

## 2016-02-02 MED ORDER — DOXEPIN HCL 3 MG PO TABS: 1.0000 | ORAL_TABLET | Freq: Every evening | ORAL | 0 refills | Status: DC | PRN

## 2016-02-02 MED ORDER — OMEPRAZOLE 40 MG PO CPDR: DELAYED_RELEASE_CAPSULE | ORAL | 3 refills | Status: DC

## 2016-02-02 MED ORDER — POLYETHYLENE GLYCOL 3350 17 GM/SCOOP PO POWD: 17.0000 g | Freq: Every day | ORAL | 11 refills | Status: DC

## 2016-02-02 MED ORDER — LISINOPRIL-HYDROCHLOROTHIAZIDE 10-12.5 MG PO TABS: 1.0000 | ORAL_TABLET | Freq: Every day | ORAL | 3 refills | Status: DC

## 2016-02-02 MED ORDER — LEVOTHYROXINE SODIUM 75 MCG PO TABS: 75.0000 ug | ORAL_TABLET | Freq: Every day | ORAL | 3 refills | Status: DC

## 2016-02-02 MED ORDER — SIMVASTATIN 40 MG PO TABS: 40.0000 mg | ORAL_TABLET | Freq: Every morning | ORAL | 3 refills | Status: DC

## 2016-02-02 NOTE — Progress Notes (Signed)
Subjective:   HPI  Laurie Ward is a 82 y.o. female who presents for a complete physical  and annual wellness exam she continues on Synthroid and is having no difficulty with this. She also takes lisinopril/HCTZ for her hypertension and again having no trouble. She states that the Prilosec is only minimally effective. She is having no difficulty with Zocor. She continues to use MiraLAX to help with her constipation. She does complain of fatigue but blames this on lack of sleep. Over the last year she has noted difficulty with sleep sometimes getting only 2 or 3 hours at night. She notes no new stressors in her life. She does not take naps. She does not drink. She has tried melatonin as well as OTC meds with some relief of her symptoms..  Medical care team includes:  [facility]   Preventative care: Last ophthalmology visit: 2 years ago Last dental visit:7/17 Last colonoscopy:N/A Last mammogram:12/14/2007 Last gynecological exam: Last EKG:10/14/12 Last labs:10/07/13  Prior vaccinations: TD or Tdap:02/16/2007 Influenza:02/23/15 Pneumococcal:23:06/09/2007 Shingles/Zostavax:02/16/2007 Other:   Advanced directive:Yes  Health care power of attorney:Yes  Living will:Yes  Reviewed their medical, surgical, family, social, medication, and allergy history and updated chart as appropriate.She has been married for 70 years. Life is going quite well for her right now. Her immunizations and health maintenance are up-to-date.    Review of Systems Constitutional: -fever, -chills, -sweats, -unexpected weight change, -decreased appetite, -fatigue Allergy: -sneezing, -itching, -congestion Dermatology: -changing moles, --rash, -lumps ENT: -runny nose, -ear pain, -sore throat, -hoarseness, -sinus pain, -teeth pain, - ringing in ears, -hearing loss, -nosebleeds Cardiology: -chest pain, -palpitations, -swelling, -difficulty breathing when lying flat, -waking up short of breath Respiratory:  -cough, -shortness of breath, -difficulty breathing with exercise or exertion, -wheezing, -coughing up blood Gastroenterology: -abdominal pain, -nausea, -vomiting, -diarrhea, -constipation, -blood in stool, -changes in bowel movement, -difficulty swallowing or eating Hematology: -bleeding, -bruising  Musculoskeletal: -joint aches, -muscle aches, -joint swelling, -back pain, -neck pain, -cramping, -changes in gait Ophthalmology: denies vision changes, eye redness, itching, discharge Urology: -burning with urination, -difficulty urinating, -blood in urine, -urinary frequency, -urgency, -incontinence Neurology: -headache, -weakness, -tingling, -numbness, -memory loss, -falls, -dizziness Psychology: -depressed mood, -agitation, -sleep problems     Objective:   Physical Exam  General appearance: alert, no distress, WD/WN,  Skin: Normal.  HEENT: normocephalic, conjunctiva/corneas normal, sclerae anicteric, PERRLA, EOMi, nares patent, no discharge or erythema, pharynx normal Oral cavity: MMM, tongue normal, teeth normal Neck: supple, no lymphadenopathy, no thyromegaly, no masses, normal ROM Chest: non tender, normal shape and expansion Heart: RRR, normal S1, S2, no murmurs Lungs: CTA bilaterally, no wheezes, rhonchi, or rales Abdomen: +bs, soft, non tender, non distended, no masses, no hepatomegaly, no splenomegaly, no bruits Back: non tender, normal ROM, no scoliosis Musculoskeletal: upper extremities non tender, no obvious deformity, normal ROM throughout, lower extremities non tender, no obvious deformity, normal ROM throughout Extremities: no edema, no cyanosis, no clubbing Pulses: 2+ symmetric, upper and lower extremities, normal cap refill Neurological: alert, oriented x 3, CN2-12 intact, strength normal upper extremities and lower extremities, sensation normal throughout, DTRs 2+ throughout, no cerebellar signs, gait normal Psychiatric: normal affect, behavior normal, pleasant       Assessment and Plan :   Routine general medical examination at a health care facility  Constipation, unspecified constipation type - Plan: polyethylene glycol powder (GLYCOLAX/MIRALAX) powder  Need for prophylactic vaccination against Streptococcus pneumoniae (pneumococcus) - Plan: Pneumococcal conjugate vaccine 13-valent  Sleep disturbance - Plan: Doxepin HCl  3 MG TABS  Essential hypertension - Plan: lisinopril-hydrochlorothiazide (PRINZIDE,ZESTORETIC) 10-12.5 MG tablet, CBC with Differential/Platelet, Comprehensive metabolic panel  Hypothyroidism, unspecified hypothyroidism type - Plan: levothyroxine (SYNTHROID, LEVOTHROID) 75 MCG tablet, TSH  Gastroesophageal reflux disease, esophagitis presence not specified - Plan: omeprazole (PRILOSEC) 40 MG capsule  Hyperlipidemia LDL goal <100 - Plan: simvastatin (ZOCOR) 40 MG tablet, Lipid panel  Chronic fatigue She will continue on her present medication regimen. I will give her a sleep med and recommend she use this for several days and then as needed after that. Also sleep hygiene information given.     Physical exam - discussed healthy lifestyle, diet, exercise, preventative care, vaccinations, and addressed their concerns.  Handout given.  Follow-up As needed

## 2016-02-02 NOTE — Patient Instructions (Addendum)
Insomnia Insomnia is a sleep disorder that makes it difficult to fall asleep or to stay asleep. Insomnia can cause tiredness (fatigue), low energy, difficulty concentrating, mood swings, and poor performance at work or school.  There are three different ways to classify insomnia:  Difficulty falling asleep.  Difficulty staying asleep.  Waking up too early in the morning. Any type of insomnia can be long-term (chronic) or short-term (acute). Both are common. Short-term insomnia usually lasts for three months or less. Chronic insomnia occurs at least three times a week for longer than three months. CAUSES  Insomnia may be caused by another condition, situation, or substance, such as:  Anxiety.  Certain medicines.  Gastroesophageal reflux disease (GERD) or other gastrointestinal conditions.  Asthma or other breathing conditions.  Restless legs syndrome, sleep apnea, or other sleep disorders.  Chronic pain.  Menopause. This may include hot flashes.  Stroke.  Abuse of alcohol, tobacco, or illegal drugs.  Depression.  Caffeine.   Neurological disorders, such as Alzheimer disease.  An overactive thyroid (hyperthyroidism). The cause of insomnia may not be known. RISK FACTORS Risk factors for insomnia include:  Gender. Women are more commonly affected than men.  Age. Insomnia is more common as you get older.  Stress. This may involve your professional or personal life.  Income. Insomnia is more common in people with lower income.  Lack of exercise.   Irregular work schedule or night shifts.  Traveling between different time zones. SIGNS AND SYMPTOMS If you have insomnia, trouble falling asleep or trouble staying asleep is the main symptom. This may lead to other symptoms, such as:  Feeling fatigued.  Feeling nervous about going to sleep.  Not feeling rested in the morning.  Having trouble concentrating.  Feeling irritable, anxious, or depressed. TREATMENT   Treatment for insomnia depends on the cause. If your insomnia is caused by an underlying condition, treatment will focus on addressing the condition. Treatment may also include:   Medicines to help you sleep.  Counseling or therapy.  Lifestyle adjustments. HOME CARE INSTRUCTIONS   Take medicines only as directed by your health care provider.  Keep regular sleeping and waking hours. Avoid naps.  Keep a sleep diary to help you and your health care provider figure out what could be causing your insomnia. Include:   When you sleep.  When you wake up during the night.  How well you sleep.   How rested you feel the next day.  Any side effects of medicines you are taking.  What you eat and drink.   Make your bedroom a comfortable place where it is easy to fall asleep:  Put up shades or special blackout curtains to block light from outside.  Use a white noise machine to block noise.  Keep the temperature cool.   Exercise regularly as directed by your health care provider. Avoid exercising right before bedtime.  Use relaxation techniques to manage stress. Ask your health care provider to suggest some techniques that may work well for you. These may include:  Breathing exercises.  Routines to release muscle tension.  Visualizing peaceful scenes.  Cut back on alcohol, caffeinated beverages, and cigarettes, especially close to bedtime. These can disrupt your sleep.  Do not overeat or eat spicy foods right before bedtime. This can lead to digestive discomfort that can make it hard for you to sleep.  Limit screen use before bedtime. This includes:  Watching TV.  Using your smartphone, tablet, and computer.  Stick to a routine. This   can help you fall asleep faster. Try to do a quiet activity, brush your teeth, and go to bed at the same time each night.  Get out of bed if you are still awake after 15 minutes of trying to sleep. Keep the lights down, but try reading or  doing a quiet activity. When you feel sleepy, go back to bed.  Make sure that you drive carefully. Avoid driving if you feel very sleepy.  Keep all follow-up appointments as directed by your health care provider. This is important. SEEK MEDICAL CARE IF:   You are tired throughout the day or have trouble in your daily routine due to sleepiness.  You continue to have sleep problems or your sleep problems get worse. SEEK IMMEDIATE MEDICAL CARE IF:   You have serious thoughts about hurting yourself or someone else.   take the sleeping medicine nightly for about 5 or 6 nights and then use the rest as needed. Let's see how long they last This information is not intended to replace advice given to you by your health care provider. Make sure you discuss any questions you have with your health care provider.   Document Released: 06/07/2000 Document Revised: 03/01/2015 Document Reviewed: 03/11/2014 Elsevier Interactive Patient Education [employer]2016 [employer].

## 2016-02-05 ENCOUNTER — Telehealth: Payer: Self-pay | Admitting: Family Medicine

## 2016-02-05 NOTE — Telephone Encounter (Signed)
P.A. [facility]mark[facility]ILENOR

## 2016-02-07 NOTE — Telephone Encounter (Signed)
Additional fax form received stating alternatives are Belsomra, Rozerem, Trazodone, waiting response from Dr. Susann GivensLalonde

## 2016-02-08 MED ORDER — TRAZODONE HCL 50 MG PO TABS: 25.0000 mg | ORAL_TABLET | Freq: Every evening | ORAL | 0 refills | Status: DC | PRN

## 2016-02-08 NOTE — Telephone Encounter (Signed)
Called pt & informed insurance requires failure of preferred meds, Belsomra, Rozerem, Trazodone .  Per Dr. Susann GivensLalonde switched to Trazodone.  Pt informed

## 2016-02-17 DIAGNOSIS — L2089 Other atopic dermatitis: Secondary | ICD-10-CM | POA: Diagnosis not present

## 2016-02-29 ENCOUNTER — Other Ambulatory Visit: Payer: Self-pay | Admitting: Family Medicine

## 2016-02-29 NOTE — Telephone Encounter (Signed)
Called trazadon in per [employer]

## 2016-02-29 NOTE — Telephone Encounter (Signed)
Is this okay to refill? 

## 2016-03-14 ENCOUNTER — Other Ambulatory Visit: Payer: Self-pay | Admitting: Family Medicine

## 2016-03-15 ENCOUNTER — Encounter: Payer: Self-pay | Admitting: Family Medicine

## 2016-03-15 ENCOUNTER — Ambulatory Visit (INDEPENDENT_AMBULATORY_CARE_PROVIDER_SITE_OTHER): Payer: Medicare Other | Admitting: Family Medicine

## 2016-03-15 VITALS — BP 122/80 | HR 82 | Temp 97.8°F | Wt 138.4 lb

## 2016-03-15 DIAGNOSIS — R232 Flushing: Secondary | ICD-10-CM

## 2016-03-15 NOTE — Progress Notes (Signed)
   Subjective:    Patient ID: Laurie Ward, female    DOB: [birth year], 82 y.o.   MRN: [id number]  HPI Chief Complaint  Patient presents with  . Other    face burning and red. been going on 6 months   She is here with complaints of a 6 month history of her cheeks feeling like they are "on fire" and red. This occurs intermittently and lasts for a few minutes to a few hours. States it often occurs in the middle of the night and 1-2 times during the day. States it happens often after getting out of the shower. Is not sure of other triggers.   Has noticed her eyelids being red and itching occasionally. Denies any redness of conjunctiva, eye pain or vision changes.  Denies changes to any other areas of skin.   Reports history of allergies, triggers are seasonal changes and certain foods like chocolate, tomatoes and lettuce. Takes daily benadryl daily for this.  Denies drinking alcohol or smoking.  Denies fever, chills, fatigue, unexplained weight change,  joint pain or inflammation, GI or GU symptoms.   Past Medical History:  Diagnosis Date  . Arthritis   . Asthma   . COPD (chronic obstructive pulmonary disease) (HCC)   . Former smoker   . GERD (gastroesophageal reflux disease)   . HH (hiatus hernia)   . HH (hiatus hernia)   . Hypercholesterolemia   . Hypertension   . Osteoporosis    OSTEOPENIA  . Thyroid disease    HYPOTHYROID     Review of Systems Pertinent positives and negatives in the history of present illness.     Objective:   Physical Exam  Constitutional: She is oriented to person, place, and time. She appears well-developed and well-nourished. No distress.  HENT:  Nose: Nose normal.  Mouth/Throat: Oropharynx is clear and moist.  Eyes: Conjunctivae are normal. Pupils are equal, round, and reactive to light. Right eye exhibits no discharge. Left eye exhibits no discharge.  Neurological: She is alert and oriented to person, place, and time.  Skin: Skin is warm and  dry. No rash noted. No erythema. No pallor.  Psychiatric: She has a normal mood and affect. Her behavior is normal. Judgment and thought content normal.   BP 122/80   Pulse 82   Temp 97.8 F (36.6 C) (Oral)   Wt 138 lb 6.4 oz (62.8 kg)   BMI 26.15 kg/m       Assessment & Plan:  Facial flushing  Discussed that her symptoms sound transient and somewhat consistent with rosacea but she is not having any signs or symptoms of this today. Advised that she should keep a diary of when the facial flushing and burning sensation is occurring, how often, how long it lasts and what she was doing prior to the flare up. She does not want treatment for this now, states it is not that bothersome.  She will call or return if symptoms occur more frequently or are worsening. Discussed trying topical metronidazole, patient declines for now. She verbalized that she is comfortable with the plan.

## 2016-03-23 ENCOUNTER — Other Ambulatory Visit: Payer: Self-pay | Admitting: Family Medicine

## 2016-03-25 NOTE — Telephone Encounter (Signed)
Is this okay to refill? 

## 2016-04-11 ENCOUNTER — Telehealth: Payer: Self-pay | Admitting: Family Medicine

## 2016-04-11 MED ORDER — TRAZODONE HCL 50 MG PO TABS: 25.0000 mg | ORAL_TABLET | Freq: Every evening | ORAL | 0 refills | Status: DC | PRN

## 2016-04-11 NOTE — Telephone Encounter (Signed)
Rcvd refill request for Trazodone 50 mg #36

## 2016-04-12 ENCOUNTER — Telehealth: Payer: Self-pay | Admitting: Family Medicine

## 2016-04-12 NOTE — Telephone Encounter (Signed)
Recv'd fax request Trazodone 50mg  for 90 days supply from [facility]

## 2016-04-19 ENCOUNTER — Other Ambulatory Visit: Payer: Self-pay | Admitting: Family Medicine

## 2016-04-19 NOTE — Telephone Encounter (Signed)
Is this okay to refill? 

## 2016-04-19 NOTE — Telephone Encounter (Signed)
This was called in on the19th

## 2016-05-10 ENCOUNTER — Other Ambulatory Visit: Payer: Self-pay

## 2016-05-10 ENCOUNTER — Telehealth: Payer: Self-pay | Admitting: Family Medicine

## 2016-05-10 MED ORDER — TRAZODONE HCL 50 MG PO TABS: 25.0000 mg | ORAL_TABLET | Freq: Every evening | ORAL | 0 refills | Status: DC | PRN

## 2016-05-10 NOTE — Telephone Encounter (Signed)
Rcvd refill request for 90 day supply of Trazodone 50 mg

## 2016-05-10 NOTE — Telephone Encounter (Signed)
This has been done.

## 2016-05-10 NOTE — Telephone Encounter (Signed)
Okay 

## 2016-06-06 DIAGNOSIS — H5203 Hypermetropia, bilateral: Secondary | ICD-10-CM | POA: Diagnosis not present

## 2016-06-06 DIAGNOSIS — H353132 Nonexudative age-related macular degeneration, bilateral, intermediate dry stage: Secondary | ICD-10-CM | POA: Diagnosis not present

## 2016-08-09 ENCOUNTER — Other Ambulatory Visit: Payer: Self-pay | Admitting: Family Medicine

## 2016-08-09 NOTE — Telephone Encounter (Signed)
Is this okay to refill? 

## 2016-09-07 ENCOUNTER — Other Ambulatory Visit: Payer: Self-pay | Admitting: Family Medicine

## 2016-09-09 DIAGNOSIS — H353132 Nonexudative age-related macular degeneration, bilateral, intermediate dry stage: Secondary | ICD-10-CM | POA: Diagnosis not present

## 2016-09-09 DIAGNOSIS — H04123 Dry eye syndrome of bilateral lacrimal glands: Secondary | ICD-10-CM | POA: Diagnosis not present

## 2016-09-09 DIAGNOSIS — Z961 Presence of intraocular lens: Secondary | ICD-10-CM | POA: Diagnosis not present

## 2016-11-07 ENCOUNTER — Encounter: Payer: Self-pay | Admitting: Family Medicine

## 2016-11-07 ENCOUNTER — Ambulatory Visit (INDEPENDENT_AMBULATORY_CARE_PROVIDER_SITE_OTHER): Payer: Medicare Other | Admitting: Family Medicine

## 2016-11-07 VITALS — BP 118/70 | HR 69 | Wt 132.0 lb

## 2016-11-07 DIAGNOSIS — R197 Diarrhea, unspecified: Secondary | ICD-10-CM | POA: Diagnosis not present

## 2016-11-07 DIAGNOSIS — R300 Dysuria: Secondary | ICD-10-CM | POA: Diagnosis not present

## 2016-11-07 DIAGNOSIS — K5900 Constipation, unspecified: Secondary | ICD-10-CM | POA: Diagnosis not present

## 2016-11-07 DIAGNOSIS — M549 Dorsalgia, unspecified: Secondary | ICD-10-CM

## 2016-11-07 DIAGNOSIS — G479 Sleep disorder, unspecified: Secondary | ICD-10-CM | POA: Diagnosis not present

## 2016-11-07 LAB — POCT URINALYSIS DIPSTICK
Bilirubin, UA: NEGATIVE
GLUCOSE UA: NEGATIVE
Ketones, UA: NEGATIVE
LEUKOCYTES UA: NEGATIVE
NITRITE UA: NEGATIVE
Protein, UA: NEGATIVE
RBC UA: NEGATIVE
Spec Grav, UA: <=1.005 — AB (ref 1.010–1.025)
UROBILINOGEN UA: NEGATIVE U/dL — AB
pH, UA: 5 (ref 5.0–8.0)

## 2016-11-07 NOTE — Progress Notes (Signed)
   Subjective:    Patient ID: Laurie Ward, female    DOB: 1926/11/21, 83 y.o.   MRN: [id number]  HPI    Review of Systems     Objective:   Physical Exam        Assessment & Plan:

## 2016-11-07 NOTE — Progress Notes (Signed)
Subjective:     Patient ID: Laurie Ward, female   DOB: 1926-11-15, 83 y.o.   MRN: [id number]  HPI Colan NeptuneGloria Neyra is a 83 year old female with a past medical history of COPD, HTN, hypothyroid, osteoporosis, constipation, and reflux who presents today with concerns about upper back pain, dysuria, intermittent constipation and diarrhea, and sleeping problems.  Dysuria She has pain and burning with urination and frequency for the past 3 months. She has no fevers, chills, nausea, or vomiting. She has been taking one azostandard every day for the past 3 months and this has helped with her symptoms. She reports sometimes having incontinence and wears a pad to help with this.   Back Pain Patient reports this has been going on for a long time, but got worse about 6 months ago. It is worse with movement, but resting helps with the pain. She hasn't had any recent falls, trauma, or radiating pain. She reports the pain is 10/10 and unbearable. She uses ibuprofen 2 tablets once a day for pain, but this isn't helping with it. In the past she had gotten physical therapy to help with that but the cost Her from going more often.  Constipation/Diarrhea She has intermittent constipation and diarrhea which tends to be worse when she eats tomatoes, lettuce, cabbage, and fried foods. She's had they issues for several years, but they've gotten worse recently. She reports not having a bowel movement for 2-3 days and then having diarrhea. She has had trouble getting to the bathroom on time when she has the diarrhea over the past 6 months as well. She takes miralax whenever she has constipation or no bowel movements for 2-3 days.  Sleeping Problems Patient reports having trouble staying asleep, but not going to sleep. She has had trouble sleeping for a long time, but it has slowly gotten worse. She isn't sure what time she goes to bed or wakes up, but she thinks she gets <2 hours of sleep every night. She is not well-rested  when she wakes up. She gets up to urinate 1-3 times per night. She also has the television on when she gets in bed to help her sleep. She has tried half a tablet of trazodone but this hasn't been helpful. She also has tried melatonin and unisom and this has been more beneficial for sleep.   Patient's medications were reviewed and she is taking synthroid, prilosec, simvastatin, and lisinopril-HCTZ. She wasn't entirely sure about medications and what she uses for what symptoms. She reports she has not had reflux symptoms and thought the prilosec was used for her stomach problems such as constipation and diarrhea.  Review of Systems Pertinent positives and negatives included in the subjective above.    Objective:   Physical Exam Alert and in no distress.  Patient reports pain in upper back when palpating the area.  Patient had no splinting when standing from a sitting position.  Urinalysis dipstick was normal    Assessment and Plan:     Dysuria - Plan: Urinalysis Dipstick, Urine culture  Upper back pain - Plan: DG Chest 2 View  Sleep disturbance  Constipation, unspecified constipation type  Diarrhea, unspecified type  1. Dysuria: Dipstick was negative, but because of the patient's symptoms we will get a urine culture to evaluate for infection. If the culture is negative, we will evaluate for OAB. 2. Upper Back Pain: She has a history of osteoporosis. We plan to get a chest xray to evaluate for any fractures or  other skeletal issues causing her pain. 3. Sleep Disturbance: We instructed the patient to not watch television before bed and to take melatonin before bed. She frequently wakes up to use the bathroom which decreases her sleep. If the urine culture comes back negative we will evaluate her for OAB to see if treating the urinary frequency helps her sleep better. 4. Constipation/Diarrhea: Patient has intermittent constipation and diarrhea. We encouraged more regular use of the miralax  to help regulate these issues. We told her to use one scoop everyday and decrease to half a scoop every day if she has increased diarrhea while using it. We also encouraged stopping the prilosec because she is not experiencing any reflux symptoms. I agree. JCL Patient was seen in conjunction with Dr. Susann Givens. Tessie Fass, MS3

## 2016-11-09 LAB — URINE CULTURE

## 2016-11-11 ENCOUNTER — Ambulatory Visit
Admission: RE | Admit: 2016-11-11 | Discharge: 2016-11-11 | Disposition: A | Discharge status: Home / Self Care | Payer: Medicare Other | Source: Ambulatory Visit | Attending: Family Medicine | Admitting: Family Medicine

## 2016-11-11 ENCOUNTER — Other Ambulatory Visit: Payer: Self-pay

## 2016-11-11 DIAGNOSIS — R0602 Shortness of breath: Secondary | ICD-10-CM | POA: Diagnosis not present

## 2016-11-11 DIAGNOSIS — R079 Chest pain, unspecified: Secondary | ICD-10-CM | POA: Diagnosis not present

## 2016-11-11 DIAGNOSIS — M549 Dorsalgia, unspecified: Secondary | ICD-10-CM

## 2016-11-11 MED ORDER — AMOXICILLIN 875 MG PO TABS: 875.0000 mg | ORAL_TABLET | Freq: Two times a day (BID) | ORAL | 0 refills | Status: DC

## 2016-11-11 NOTE — Telephone Encounter (Signed)
Sent in amoxil 875 per [employer]

## 2016-11-22 ENCOUNTER — Encounter: Payer: Self-pay | Admitting: Family Medicine

## 2016-11-22 ENCOUNTER — Ambulatory Visit (INDEPENDENT_AMBULATORY_CARE_PROVIDER_SITE_OTHER): Payer: Medicare Other | Admitting: Family Medicine

## 2016-11-22 VITALS — BP 120/70 | HR 60 | Wt 131.0 lb

## 2016-11-22 DIAGNOSIS — M549 Dorsalgia, unspecified: Secondary | ICD-10-CM | POA: Diagnosis not present

## 2016-11-22 DIAGNOSIS — R319 Hematuria, unspecified: Secondary | ICD-10-CM | POA: Diagnosis not present

## 2016-11-22 DIAGNOSIS — G479 Sleep disorder, unspecified: Secondary | ICD-10-CM

## 2016-11-22 LAB — POCT URINALYSIS DIPSTICK
BILIRUBIN UA: NEGATIVE
Glucose, UA: NEGATIVE
Ketones, UA: NEGATIVE
LEUKOCYTES UA: NEGATIVE
NITRITE UA: NEGATIVE
PH UA: 6 (ref 5.0–8.0)
PROTEIN UA: NEGATIVE
RBC UA: NEGATIVE
Spec Grav, UA: 1.015 (ref 1.010–1.025)
UROBILINOGEN UA: 0.2 U/dL

## 2016-11-22 NOTE — Patient Instructions (Addendum)
Azo-Standard for several days. Take the melatonin every night Take the full trazodone pill as well as melatonin for the next couple weeks and see if we can break the cycle. After that just take the melatonin

## 2016-11-22 NOTE — Progress Notes (Signed)
   Subjective:    Patient ID: Laurie Ward, female    DOB: 27-Jun-1926, 83 y.o.   MRN: [id number]  HPI She is here for recheck. She does state that she feels better from her urinary tract symptoms but did note some difficulty with urgency and frequency within last day or so. She continues have difficulty with sleep but did not take melatonin on a regular basis. She was taking one half of a trazodone and again dated she was not sleeping what she thought she needed to.   Review of Systems     Objective:   Physical Exam Alert and in no distress otherwise not examined. Urine dipstick was normal.       Assessment & Plan:   Hematuria, unspecified type - Plan: POCT Urinalysis Dipstick  Upper back pain  Sleep disturbance Recommend she take no tone and on a regular basis. Also take the full trazodone for the next week or so and see if this will break her sleep cycle. She is to let me know how that works. Will also have her use Azo-Standard to help with her bladder issues and if continued difficulty may need to reevaluate for possible OAB issues.

## 2016-11-27 ENCOUNTER — Telehealth: Payer: Self-pay | Admitting: Family Medicine

## 2016-11-27 NOTE — Telephone Encounter (Signed)
Pt called and states that she was suppose to get a RX for amoxicillin when she was in her June 06/2016, and she also needs a refill on her synthroid and her trazodone pt uses  [id number] - [place], [place] - [address]. Pt can be reached at [phone]

## 2016-11-28 ENCOUNTER — Encounter: Payer: Self-pay | Admitting: Family Medicine

## 2016-11-28 MED ORDER — LEVOTHYROXINE SODIUM 75 MCG PO TABS: 75.0000 ug | ORAL_TABLET | Freq: Every day | ORAL | 3 refills | Status: DC

## 2016-11-28 MED ORDER — TRAZODONE HCL 50 MG PO TABS: ORAL_TABLET | ORAL | 2 refills | Status: DC

## 2016-11-28 NOTE — Telephone Encounter (Signed)
Pt informed and verbalized understanding

## 2016-11-28 NOTE — Telephone Encounter (Signed)
Let her know that I called in the trazodone and the thyroid medicine. I wanted her to use Azo-Standard for her bladder issue and not necessarily call in an antibiotic. If she is still having difficulty, might need to refer to urology.

## 2017-01-02 DIAGNOSIS — S20219A Contusion of unspecified front wall of thorax, initial encounter: Secondary | ICD-10-CM | POA: Diagnosis not present

## 2017-01-02 DIAGNOSIS — S60222A Contusion of left hand, initial encounter: Secondary | ICD-10-CM | POA: Diagnosis not present

## 2017-01-02 DIAGNOSIS — S52501A Unspecified fracture of the lower end of right radius, initial encounter for closed fracture: Secondary | ICD-10-CM | POA: Diagnosis not present

## 2017-01-06 DIAGNOSIS — S52502A Unspecified fracture of the lower end of left radius, initial encounter for closed fracture: Secondary | ICD-10-CM | POA: Diagnosis not present

## 2017-01-20 DIAGNOSIS — M1812 Unilateral primary osteoarthritis of first carpometacarpal joint, left hand: Secondary | ICD-10-CM | POA: Diagnosis not present

## 2017-01-20 DIAGNOSIS — S52502A Unspecified fracture of the lower end of left radius, initial encounter for closed fracture: Secondary | ICD-10-CM | POA: Diagnosis not present

## 2017-03-12 ENCOUNTER — Other Ambulatory Visit: Payer: Self-pay | Admitting: Family Medicine

## 2017-03-12 DIAGNOSIS — E785 Hyperlipidemia, unspecified: Secondary | ICD-10-CM

## 2017-03-12 DIAGNOSIS — I10 Essential (primary) hypertension: Secondary | ICD-10-CM

## 2017-03-12 DIAGNOSIS — H353132 Nonexudative age-related macular degeneration, bilateral, intermediate dry stage: Secondary | ICD-10-CM | POA: Diagnosis not present

## 2017-03-12 DIAGNOSIS — H04123 Dry eye syndrome of bilateral lacrimal glands: Secondary | ICD-10-CM | POA: Diagnosis not present

## 2017-03-12 DIAGNOSIS — Z961 Presence of intraocular lens: Secondary | ICD-10-CM | POA: Diagnosis not present

## 2017-03-12 DIAGNOSIS — K219 Gastro-esophageal reflux disease without esophagitis: Secondary | ICD-10-CM

## 2017-03-12 NOTE — Telephone Encounter (Signed)
Pt scheduled med check appt. Laurie Ward

## 2017-03-20 ENCOUNTER — Ambulatory Visit (INDEPENDENT_AMBULATORY_CARE_PROVIDER_SITE_OTHER): Payer: Medicare Other | Admitting: Family Medicine

## 2017-03-20 ENCOUNTER — Encounter: Payer: Self-pay | Admitting: Family Medicine

## 2017-03-20 VITALS — BP 124/64 | HR 68 | Wt 129.4 lb

## 2017-03-20 DIAGNOSIS — I10 Essential (primary) hypertension: Secondary | ICD-10-CM

## 2017-03-20 DIAGNOSIS — E039 Hypothyroidism, unspecified: Secondary | ICD-10-CM | POA: Diagnosis not present

## 2017-03-20 DIAGNOSIS — G479 Sleep disorder, unspecified: Secondary | ICD-10-CM | POA: Diagnosis not present

## 2017-03-20 DIAGNOSIS — M810 Age-related osteoporosis without current pathological fracture: Secondary | ICD-10-CM | POA: Diagnosis not present

## 2017-03-20 DIAGNOSIS — E785 Hyperlipidemia, unspecified: Secondary | ICD-10-CM | POA: Diagnosis not present

## 2017-03-20 DIAGNOSIS — K219 Gastro-esophageal reflux disease without esophagitis: Secondary | ICD-10-CM | POA: Diagnosis not present

## 2017-03-20 MED ORDER — LISINOPRIL-HYDROCHLOROTHIAZIDE 10-12.5 MG PO TABS: 1.0000 | ORAL_TABLET | Freq: Every day | ORAL | 0 refills | Status: DC

## 2017-03-20 MED ORDER — SIMVASTATIN 40 MG PO TABS: 40.0000 mg | ORAL_TABLET | Freq: Every morning | ORAL | 0 refills | Status: DC

## 2017-03-20 MED ORDER — LEVOTHYROXINE SODIUM 75 MCG PO TABS: 75.0000 ug | ORAL_TABLET | Freq: Every day | ORAL | 3 refills | Status: DC

## 2017-03-20 MED ORDER — TRAZODONE HCL 50 MG PO TABS: ORAL_TABLET | ORAL | 2 refills | Status: DC

## 2017-03-20 NOTE — Progress Notes (Signed)
   Subjective:    Patient ID: Laurie Ward, female    DOB: [birth year], 83 y.o.   MRN: [id number]  HPI She is here for a medication check. She continues to do quite nicely on her Synthroid and is having no hot or cold intolerance, fatigue. She continues on Zocor and has no aches or pains. Lisinopril is causing no difficulty. She does have some reflux and is using Prilosec daily.she has a previous history of osteoporosis and presently is not on a bisphosphonate as she finished the course. She does complain of sleep disturbance. In the past should given Desyrel. She does not remember being on this medication.   Review of Systems     Objective:   Physical Exam Alert and in no distress. Tympanic membranes and canals are normal. Pharyngeal area is normal. Neck is supple without adenopathy or thyromegaly. Cardiac exam shows a regular sinus rhythm without murmurs or gallops. Lungs are clear to auscultation.abdominal exam shows normal bowel sounds without masses or tenderness.        Assessment & Plan:  Hypothyroidism, unspecified type - Plan: levothyroxine (SYNTHROID, LEVOTHROID) 75 MCG tablet  Hyperlipidemia LDL goal <100 - Plan: simvastatin (ZOCOR) 40 MG tablet, Lipid panel  Essential hypertension - Plan: lisinopril-hydrochlorothiazide (PRINZIDE,ZESTORETIC) 10-12.5 MG tablet, CBC with Differential/Platelet, Comprehensive metabolic panel  Gastroesophageal reflux disease, esophagitis presence not specified - Plan: CBC with Differential/Platelet, Comprehensive metabolic panel  Osteoporosis, unspecified osteoporosis type, unspecified pathological fracture presence  Sleep disturbance - Plan: traZODone (DESYREL) 50 MG tablet she has already received shot. I will continue her on her present regimen pending the blood work. Will give her a small amount of does roll an see if this helps wince although I do have questions concerning cognitive difficulty.

## 2017-03-20 NOTE — Patient Instructions (Signed)
Try taking the omeprazole every other day to control your indigestion symptoms and then take it every 3 days, reducing to the minimum effective dose

## 2017-03-21 LAB — CBC WITH DIFFERENTIAL/PLATELET
BASOS ABS: 53 {cells}/uL (ref 0–200)
Basophils Relative: 0.7 %
EOS PCT: 3.2 %
Eosinophils Absolute: 240 cells/uL (ref 15–500)
HEMATOCRIT: 36.6 % (ref 35.0–45.0)
Hemoglobin: 12.1 g/dL (ref 11.7–15.5)
LYMPHS ABS: 2618 {cells}/uL (ref 850–3900)
MCH: 29.1 pg (ref 27.0–33.0)
MCHC: 33.1 g/dL (ref 32.0–36.0)
MCV: 88 fL (ref 80.0–100.0)
MPV: 10.3 fL (ref 7.5–12.5)
Monocytes Relative: 8.7 %
NEUTROS PCT: 52.5 %
Neutro Abs: 3938 cells/uL (ref 1500–7800)
Platelets: 351 10*3/uL (ref 140–400)
RBC: 4.16 10*6/uL (ref 3.80–5.10)
RDW: 13.3 % (ref 11.0–15.0)
Total Lymphocyte: 34.9 %
WBC: 7.5 10*3/uL (ref 3.8–10.8)
WBCMIX: 653 {cells}/uL (ref 200–950)

## 2017-03-21 LAB — COMPREHENSIVE METABOLIC PANEL
AG RATIO: 1.4 (calc) (ref 1.0–2.5)
ALBUMIN MSPROF: 4.1 g/dL (ref 3.6–5.1)
ALT: 11 U/L (ref 6–29)
AST: 20 U/L (ref 10–35)
Alkaline phosphatase (APISO): 42 U/L (ref 33–130)
BUN / CREAT RATIO: 17 (calc) (ref 6–22)
BUN: 19 mg/dL (ref 7–25)
CHLORIDE: 105 mmol/L (ref 98–110)
CO2: 25 mmol/L (ref 20–32)
CREATININE: 1.13 mg/dL — AB (ref 0.60–0.88)
Calcium: 9.3 mg/dL (ref 8.6–10.4)
GLOBULIN: 2.9 g/dL (ref 1.9–3.7)
GLUCOSE: 100 mg/dL — AB (ref 65–99)
POTASSIUM: 4.3 mmol/L (ref 3.5–5.3)
SODIUM: 140 mmol/L (ref 135–146)
Total Bilirubin: 0.4 mg/dL (ref 0.2–1.2)
Total Protein: 7 g/dL (ref 6.1–8.1)

## 2017-03-21 LAB — LIPID PANEL
CHOL/HDL RATIO: 4.2 (calc) (ref <5.0)
Cholesterol: 182 mg/dL (ref <200)
HDL: 43 mg/dL — ABNORMAL LOW (ref >50)
LDL Cholesterol (Calc): 108 mg/dL (calc) — ABNORMAL HIGH
NON-HDL CHOLESTEROL (CALC): 139 mg/dL — AB (ref <130)
Triglycerides: 194 mg/dL — ABNORMAL HIGH (ref <150)

## 2017-04-09 ENCOUNTER — Other Ambulatory Visit: Payer: Self-pay | Admitting: Family Medicine

## 2017-04-09 DIAGNOSIS — K219 Gastro-esophageal reflux disease without esophagitis: Secondary | ICD-10-CM

## 2017-04-11 ENCOUNTER — Other Ambulatory Visit: Payer: Self-pay | Admitting: Family Medicine

## 2017-04-11 DIAGNOSIS — K219 Gastro-esophageal reflux disease without esophagitis: Secondary | ICD-10-CM

## 2017-05-08 ENCOUNTER — Other Ambulatory Visit: Payer: Self-pay | Admitting: Family Medicine

## 2017-05-08 DIAGNOSIS — I10 Essential (primary) hypertension: Secondary | ICD-10-CM

## 2017-05-08 DIAGNOSIS — E785 Hyperlipidemia, unspecified: Secondary | ICD-10-CM

## 2017-05-12 ENCOUNTER — Other Ambulatory Visit: Payer: Self-pay | Admitting: Family Medicine

## 2017-05-12 DIAGNOSIS — I10 Essential (primary) hypertension: Secondary | ICD-10-CM

## 2017-05-12 DIAGNOSIS — E785 Hyperlipidemia, unspecified: Secondary | ICD-10-CM

## 2017-05-22 ENCOUNTER — Other Ambulatory Visit: Payer: Self-pay | Admitting: Family Medicine

## 2017-05-22 DIAGNOSIS — I10 Essential (primary) hypertension: Secondary | ICD-10-CM

## 2017-05-29 ENCOUNTER — Other Ambulatory Visit: Payer: Self-pay | Admitting: Family Medicine

## 2017-05-29 DIAGNOSIS — I10 Essential (primary) hypertension: Secondary | ICD-10-CM

## 2017-05-29 DIAGNOSIS — E785 Hyperlipidemia, unspecified: Secondary | ICD-10-CM

## 2017-07-28 ENCOUNTER — Other Ambulatory Visit: Payer: Self-pay | Admitting: Family Medicine

## 2017-07-28 DIAGNOSIS — K219 Gastro-esophageal reflux disease without esophagitis: Secondary | ICD-10-CM

## 2017-08-07 ENCOUNTER — Encounter: Payer: Self-pay | Admitting: Family Medicine

## 2017-08-07 ENCOUNTER — Ambulatory Visit: Payer: Medicare Other | Admitting: Family Medicine

## 2017-08-07 VITALS — BP 140/84 | HR 73 | Wt 131.8 lb

## 2017-08-07 DIAGNOSIS — E785 Hyperlipidemia, unspecified: Secondary | ICD-10-CM

## 2017-08-07 DIAGNOSIS — M810 Age-related osteoporosis without current pathological fracture: Secondary | ICD-10-CM

## 2017-08-07 DIAGNOSIS — E039 Hypothyroidism, unspecified: Secondary | ICD-10-CM | POA: Diagnosis not present

## 2017-08-07 DIAGNOSIS — G3184 Mild cognitive impairment, so stated: Secondary | ICD-10-CM | POA: Insufficient documentation

## 2017-08-07 DIAGNOSIS — K5900 Constipation, unspecified: Secondary | ICD-10-CM

## 2017-08-07 DIAGNOSIS — I10 Essential (primary) hypertension: Secondary | ICD-10-CM

## 2017-08-07 DIAGNOSIS — K219 Gastro-esophageal reflux disease without esophagitis: Secondary | ICD-10-CM

## 2017-08-07 MED ORDER — ESOMEPRAZOLE MAGNESIUM 40 MG PO CPDR: 40.0000 mg | DELAYED_RELEASE_CAPSULE | Freq: Every day | ORAL | 3 refills | Status: DC

## 2017-08-07 MED ORDER — LISINOPRIL-HYDROCHLOROTHIAZIDE 10-12.5 MG PO TABS: 1.0000 | ORAL_TABLET | Freq: Every day | ORAL | 3 refills | Status: DC

## 2017-08-07 MED ORDER — SIMVASTATIN 40 MG PO TABS: ORAL_TABLET | ORAL | 3 refills | Status: DC

## 2017-08-07 NOTE — Patient Instructions (Signed)
The medicine for your stomach is called Nexium so let me know how that works for your stomach give me a call in about a month

## 2017-08-07 NOTE — Progress Notes (Signed)
   Subjective:    Patient ID: Laurie Ward, female    DOB: 31-Mar-1927, 84 y.o.   MRN: [id number]  HPI She is here for an interval evaluation.  She does continue to have difficulty with abdominal discomfort.  She presently is on Prilosec and states that it helps but does not get a read it but entirely.  She also has a history of hypertension but states that she is not taking medication at the present time.  She also is on simvastatin for lipids as well as level thyroid for her thyroid condition.  She also has a history of osteoporosis and has been on Fosamax for several years but there is no follow-up DEXA scan.  She has a history of mild cognitive impairment but today has no particular complaints regarding memory loss of function.   Review of Systems     Objective:   Physical Exam Alert and in no distress. Tympanic membranes and canals are normal. Pharyngeal area is normal. Neck is supple without adenopathy or thyromegaly. Cardiac exam shows a regular sinus rhythm without murmurs or gallops. Lungs are clear to auscultation.        Assessment & Plan:  Constipation, unspecified constipation type  Gastroesophageal reflux disease, esophagitis presence not specified - Plan: esomeprazole (NEXIUM) 40 MG capsule  Hyperlipidemia LDL goal <100  Essential hypertension  Hypothyroidism, unspecified type - Plan: TSH  Osteoporosis, unspecified osteoporosis type, unspecified pathological fracture presence - Plan: DG Bone Density  Mild cognitive impairment I will get follow-up on her thyroid function as well as bone density and switch her to Nexium.  She is to call me and let me know how she is doing.

## 2017-08-08 LAB — TSH: TSH: 7.92 u[IU]/mL — ABNORMAL HIGH (ref 0.450–4.500)

## 2017-08-08 NOTE — Addendum Note (Signed)
Addended by: Ronnald NianLALONDE, JOHN C on: 08/08/2017 09:51 AM   Modules accepted: Orders

## 2017-09-09 DIAGNOSIS — H353132 Nonexudative age-related macular degeneration, bilateral, intermediate dry stage: Secondary | ICD-10-CM | POA: Diagnosis not present

## 2017-09-09 DIAGNOSIS — Z961 Presence of intraocular lens: Secondary | ICD-10-CM | POA: Diagnosis not present

## 2017-09-09 DIAGNOSIS — H5203 Hypermetropia, bilateral: Secondary | ICD-10-CM | POA: Diagnosis not present

## 2017-09-27 ENCOUNTER — Other Ambulatory Visit: Payer: Self-pay | Admitting: Family Medicine

## 2017-09-27 DIAGNOSIS — K219 Gastro-esophageal reflux disease without esophagitis: Secondary | ICD-10-CM

## 2017-10-22 ENCOUNTER — Other Ambulatory Visit: Payer: Self-pay | Admitting: Family Medicine

## 2017-10-22 DIAGNOSIS — K219 Gastro-esophageal reflux disease without esophagitis: Secondary | ICD-10-CM

## 2017-10-27 ENCOUNTER — Ambulatory Visit (INDEPENDENT_AMBULATORY_CARE_PROVIDER_SITE_OTHER): Payer: Medicare Other | Admitting: Family Medicine

## 2017-10-27 ENCOUNTER — Encounter: Payer: Self-pay | Admitting: Family Medicine

## 2017-10-27 VITALS — BP 158/74 | HR 69 | Temp 97.8°F | Ht 61.0 in | Wt 132.8 lb

## 2017-10-27 DIAGNOSIS — E785 Hyperlipidemia, unspecified: Secondary | ICD-10-CM

## 2017-10-27 DIAGNOSIS — E039 Hypothyroidism, unspecified: Secondary | ICD-10-CM

## 2017-10-27 DIAGNOSIS — G3184 Mild cognitive impairment, so stated: Secondary | ICD-10-CM | POA: Diagnosis not present

## 2017-10-27 DIAGNOSIS — M810 Age-related osteoporosis without current pathological fracture: Secondary | ICD-10-CM | POA: Diagnosis not present

## 2017-10-27 DIAGNOSIS — K219 Gastro-esophageal reflux disease without esophagitis: Secondary | ICD-10-CM | POA: Diagnosis not present

## 2017-10-27 DIAGNOSIS — G479 Sleep disorder, unspecified: Secondary | ICD-10-CM | POA: Diagnosis not present

## 2017-10-27 DIAGNOSIS — I10 Essential (primary) hypertension: Secondary | ICD-10-CM | POA: Diagnosis not present

## 2017-10-27 MED ORDER — TRAZODONE HCL 50 MG PO TABS: ORAL_TABLET | ORAL | 2 refills | Status: DC

## 2017-10-27 NOTE — Patient Instructions (Signed)
Take the Nexium every other day to help with your indigestion and also see if that will slow down the diarrhea.  If you can take it every third day if he can take it every third day

## 2017-10-27 NOTE — Progress Notes (Signed)
   Subjective:    Patient ID: Laurie Ward, female    DOB: June 04, 1927, 84 y.o.   MRN: [id number]  HPI She is here for follow-up but is not sure why it was scheduled.  Her constipation has cleared.  This apparently got better when she was placed on Nexium.  Apparently this does cause one loose stool per day.  Review of the record indicates that she does have osteoporosis but has not gotten the DEXA.  She also continues have difficulty with sleep stating she gets roughly 4 hours of sleep but when she uses the trazodone she gets 6 hours of sleep.  She did bring her medications with her but is unsure what they are used for.   Review of Systems     Objective:   Physical Exam Alert and in no distress.  My blood pressure showed a systolic of 180.       Assessment & Plan:  Gastroesophageal reflux disease, esophagitis presence not specified  Osteoporosis, unspecified osteoporosis type, unspecified pathological fracture presence - Plan: DG Bone Density  Hyperlipidemia LDL goal <100  Hypothyroidism, unspecified type  Essential hypertension  Mild cognitive impairment  Sleep disturbance - Plan: traZODone (DESYREL) 50 MG tablet She is to return here in 1 month for recheck on her blood pressure.  Also set her up for a DEXA scan.

## 2017-10-28 ENCOUNTER — Telehealth: Payer: Self-pay

## 2017-10-28 NOTE — Telephone Encounter (Signed)
Pt was called to let her know that her dexa scan was set up for Friday and pt declined saying she would be out o town. Pt will be called Monday to see if we can get a date for her. KH 10-28-17

## 2017-10-31 ENCOUNTER — Other Ambulatory Visit: Payer: Medicare Other

## 2017-11-06 DIAGNOSIS — H6123 Impacted cerumen, bilateral: Secondary | ICD-10-CM | POA: Diagnosis not present

## 2017-11-15 ENCOUNTER — Other Ambulatory Visit: Payer: Self-pay | Admitting: Family Medicine

## 2017-11-15 DIAGNOSIS — K219 Gastro-esophageal reflux disease without esophagitis: Secondary | ICD-10-CM

## 2017-11-19 ENCOUNTER — Other Ambulatory Visit: Payer: Self-pay | Admitting: Family Medicine

## 2017-11-19 DIAGNOSIS — G479 Sleep disorder, unspecified: Secondary | ICD-10-CM

## 2017-11-19 NOTE — Telephone Encounter (Signed)
[facility] Please Advise [facility]

## 2017-12-08 ENCOUNTER — Ambulatory Visit: Payer: Medicare Other | Admitting: Family Medicine

## 2017-12-08 ENCOUNTER — Encounter: Payer: Self-pay | Admitting: Family Medicine

## 2017-12-08 ENCOUNTER — Ambulatory Visit (INDEPENDENT_AMBULATORY_CARE_PROVIDER_SITE_OTHER): Payer: Medicare Other | Admitting: Family Medicine

## 2017-12-08 VITALS — BP 130/70 | HR 67 | Temp 98.0°F | Wt 132.8 lb

## 2017-12-08 DIAGNOSIS — E039 Hypothyroidism, unspecified: Secondary | ICD-10-CM

## 2017-12-08 DIAGNOSIS — K219 Gastro-esophageal reflux disease without esophagitis: Secondary | ICD-10-CM

## 2017-12-08 DIAGNOSIS — G3184 Mild cognitive impairment, so stated: Secondary | ICD-10-CM | POA: Diagnosis not present

## 2017-12-08 DIAGNOSIS — Z79899 Other long term (current) drug therapy: Secondary | ICD-10-CM

## 2017-12-08 NOTE — Patient Instructions (Addendum)
Take the Nexium on the days that you have indigestion and see if that takes care of it Take the Nexium as needed for indigestion Take the trazodone as needed for sleep Take all your other medications daily make sure you take your thyroid medicine on an empty stomach with no other medications

## 2017-12-08 NOTE — Progress Notes (Signed)
   Subjective:    Patient ID: Laurie Ward, female    DOB: 08/02/1926, 84 y.o.   MRN: [id number]  HPI She is here for medication management visit.  She did bring all of her medications in.  She does have difficulty with reflux symptoms 2 or 3 times per week but not on a daily basis.  She also brought in some Tylenol for pain but is not having any difficulty with that.  She thought it was for sleep.  She does have trazodone for that.  She continues on trazodone, lisinopril/HCTZ and simvastatin.   Review of Systems     Objective:   Physical Exam Alert and in no distress otherwise not examined       Assessment & Plan:  Medication management  Gastroesophageal reflux disease, esophagitis presence not specified  Hypothyroidism, unspecified type  Mild cognitive impairment I went over in detail her medications and how to take them.  Several of them I wrote on the label proper dosing.  She did seem to feel more comfortable with them after I went over them  with her Take the Nexium on the days that you have indigestion and see if that takes care of it Take the Nexium as needed for indigestion Take the trazodone as needed for sleep Take all your other medications daily make sure you take your thyroid medicine on an empty stomach with no other medications

## 2017-12-12 ENCOUNTER — Other Ambulatory Visit: Payer: Self-pay | Admitting: Family Medicine

## 2017-12-12 DIAGNOSIS — K219 Gastro-esophageal reflux disease without esophagitis: Secondary | ICD-10-CM

## 2018-01-03 ENCOUNTER — Other Ambulatory Visit: Payer: Self-pay | Admitting: Family Medicine

## 2018-01-03 DIAGNOSIS — K219 Gastro-esophageal reflux disease without esophagitis: Secondary | ICD-10-CM

## 2018-01-14 DIAGNOSIS — Z961 Presence of intraocular lens: Secondary | ICD-10-CM | POA: Diagnosis not present

## 2018-01-14 DIAGNOSIS — H04123 Dry eye syndrome of bilateral lacrimal glands: Secondary | ICD-10-CM | POA: Diagnosis not present

## 2018-01-14 DIAGNOSIS — H353132 Nonexudative age-related macular degeneration, bilateral, intermediate dry stage: Secondary | ICD-10-CM | POA: Diagnosis not present

## 2018-01-27 ENCOUNTER — Other Ambulatory Visit: Payer: Self-pay | Admitting: Family Medicine

## 2018-01-27 DIAGNOSIS — K219 Gastro-esophageal reflux disease without esophagitis: Secondary | ICD-10-CM

## 2018-06-06 ENCOUNTER — Other Ambulatory Visit: Payer: Self-pay | Admitting: Family Medicine

## 2018-06-06 DIAGNOSIS — E039 Hypothyroidism, unspecified: Secondary | ICD-10-CM

## 2018-06-21 ENCOUNTER — Other Ambulatory Visit: Payer: Self-pay | Admitting: Family Medicine

## 2018-06-21 DIAGNOSIS — K219 Gastro-esophageal reflux disease without esophagitis: Secondary | ICD-10-CM

## 2018-08-24 ENCOUNTER — Other Ambulatory Visit: Payer: Self-pay | Admitting: Family Medicine

## 2018-08-24 DIAGNOSIS — G479 Sleep disorder, unspecified: Secondary | ICD-10-CM

## 2018-08-24 NOTE — Telephone Encounter (Signed)
[facility] Please advise KH 

## 2018-10-21 ENCOUNTER — Other Ambulatory Visit: Payer: Self-pay

## 2018-10-21 NOTE — Patient Outreach (Signed)
[facility]) Care Management  10/21/2018  NAEMA MANTIS 1927-04-26 [id number]   Medication Adherence call to Mrs. Emilio Weltman spoke with patient she is due on Lisinopril/Hctz 10/12.5 mg and Simvastatin 40 mg patient explain she is taking 1 tablet daily on both medication her neighbor helps her set up her medications and has medication at this time. Mrs. Rotar is showing past due under [facility] Ins.   Lillia Abed CPhT Pharmacy Technician [facility] Care Management Direct Dial [phone]  Fax [phone] [email or web address]

## 2018-10-22 ENCOUNTER — Telehealth: Payer: Self-pay

## 2018-10-22 NOTE — Telephone Encounter (Signed)
Get her in here next week.  Probably need to see her in person and do an MMSE on her

## 2018-10-22 NOTE — Telephone Encounter (Signed)
Pt called seemed to be very confused about the call. Pt says she was on the phone the office. No call has been placed to her. Pt says she has a tingling feeling on and off all over body. Pt seemed to just agree with everything said. Please advise what we should do. Pt had a call from [facility] yesterday about adherence of medications. KH

## 2018-10-22 NOTE — Telephone Encounter (Signed)
Called pt son to advised mother needs an appt . No answer LVM

## 2018-10-29 ENCOUNTER — Encounter: Payer: Self-pay | Admitting: Family Medicine

## 2018-10-29 ENCOUNTER — Ambulatory Visit (INDEPENDENT_AMBULATORY_CARE_PROVIDER_SITE_OTHER): Payer: Medicare Other | Admitting: Family Medicine

## 2018-10-29 ENCOUNTER — Other Ambulatory Visit: Payer: Self-pay

## 2018-10-29 VITALS — BP 126/80 | HR 72 | Temp 97.5°F | Wt 137.6 lb

## 2018-10-29 DIAGNOSIS — E785 Hyperlipidemia, unspecified: Secondary | ICD-10-CM | POA: Diagnosis not present

## 2018-10-29 DIAGNOSIS — G479 Sleep disorder, unspecified: Secondary | ICD-10-CM | POA: Diagnosis not present

## 2018-10-29 DIAGNOSIS — E039 Hypothyroidism, unspecified: Secondary | ICD-10-CM | POA: Diagnosis not present

## 2018-10-29 DIAGNOSIS — I10 Essential (primary) hypertension: Secondary | ICD-10-CM

## 2018-10-29 DIAGNOSIS — G3184 Mild cognitive impairment, so stated: Secondary | ICD-10-CM

## 2018-10-29 MED ORDER — LISINOPRIL-HYDROCHLOROTHIAZIDE 10-12.5 MG PO TABS: 1.0000 | ORAL_TABLET | Freq: Every day | ORAL | 3 refills | Status: DC

## 2018-10-29 MED ORDER — DONEPEZIL HCL 5 MG PO TABS: 5.0000 mg | ORAL_TABLET | Freq: Every day | ORAL | 5 refills | Status: DC

## 2018-10-29 MED ORDER — LEVOTHYROXINE SODIUM 75 MCG PO TABS: 75.0000 ug | ORAL_TABLET | Freq: Every day | ORAL | 3 refills | Status: DC

## 2018-10-29 MED ORDER — SIMVASTATIN 40 MG PO TABS: ORAL_TABLET | ORAL | 3 refills | Status: DC

## 2018-10-29 NOTE — Patient Instructions (Signed)
Return here in 1 month and bring all the medications.

## 2018-10-29 NOTE — Addendum Note (Signed)
Addended by: Renelda Loma on: 10/29/2018 03:50 PM   Modules accepted: Orders

## 2018-10-29 NOTE — Progress Notes (Signed)
   Subjective:    Patient ID: Laurie Ward, female    DOB: 1926/09/13, 85 y.o.   MRN: [id number]  HPI She is here with her daughter for further evaluation of mild cognitive impairment.  She has a history of this in the past and apparently her daughter is noting more and more memory issues.  There is also question of delusional ideation stating that her mother made a comment about some people in the bedroom.  Also on occasion she has gotten ready to go to church early in the morning.  There is also been some occasional bouts of irritability.  Apparently she is still taking all of her medications and her husband is keeping up-to-date on now that.  There is a question of how accurate the medication list and how often she is taking the medicine.  The med list includes Synthroid, lisinopril/HCTZ, simvastatin she does not drink.   Review of Systems     Objective:   Physical Exam Alert and in no distress.  DTRs normal.  Oriented x2 tympanic membranes and canals are normal. Pharyngeal area is normal. Neck is supple without adenopathy or thyromegaly. Cardiac exam shows a regular sinus rhythm without murmurs or gallops. Lungs are clear to auscultation. MMSE 22      Assessment & Plan:  Mild cognitive impairment - Plan: CBC with Differential/Platelet, Comprehensive metabolic panel, TSH, Vitamin B12, Folate, donepezil (ARICEPT) 5 MG tablet  Hyperlipidemia LDL goal <100 - Plan: Lipid panel, simvastatin (ZOCOR) 40 MG tablet  Essential hypertension - Plan: CBC with Differential/Platelet, Comprehensive metabolic panel, lisinopril-hydrochlorothiazide (ZESTORETIC) 10-12.5 MG tablet  Sleep disturbance  Hypothyroidism, unspecified type - Plan: levothyroxine (SYNTHROID) 75 MCG tablet Discussed this with the daughter.  I will have home health come by and make an assessment of their needs.  Also discussed the family members calling on a regular basis to check on the parents and the possibility of eventually  going into assisted living.  They are to return here in 1 month for reevaluation and bring in all the medications.

## 2018-10-30 ENCOUNTER — Other Ambulatory Visit: Payer: Self-pay | Admitting: Family Medicine

## 2018-10-30 DIAGNOSIS — E785 Hyperlipidemia, unspecified: Secondary | ICD-10-CM

## 2018-10-30 DIAGNOSIS — I10 Essential (primary) hypertension: Secondary | ICD-10-CM

## 2018-10-30 LAB — COMPREHENSIVE METABOLIC PANEL
ALT: 10 IU/L (ref 0–32)
AST: 18 IU/L (ref 0–40)
Albumin/Globulin Ratio: 1.6 (ref 1.2–2.2)
Albumin: 4.4 g/dL (ref 3.6–4.6)
Alkaline Phosphatase: 59 IU/L (ref 39–117)
BUN/Creatinine Ratio: 16 (ref 12–28)
BUN: 17 mg/dL (ref 8–27)
Bilirubin Total: 0.2 mg/dL (ref 0.0–1.2)
CO2: 21 mmol/L (ref 20–29)
Calcium: 9.5 mg/dL (ref 8.7–10.3)
Chloride: 107 mmol/L — ABNORMAL HIGH (ref 96–106)
Creatinine, Ser: 1.09 mg/dL — ABNORMAL HIGH (ref 0.57–1.00)
GFR calc Af Amer: 52 mL/min/{1.73_m2} — ABNORMAL LOW (ref >59)
GFR calc non Af Amer: 45 mL/min/{1.73_m2} — ABNORMAL LOW (ref >59)
Globulin, Total: 2.7 g/dL (ref 1.5–4.5)
Glucose: 97 mg/dL (ref 65–99)
Potassium: 4.3 mmol/L (ref 3.5–5.2)
Sodium: 140 mmol/L (ref 134–144)
Total Protein: 7.1 g/dL (ref 6.0–8.5)

## 2018-10-30 LAB — CBC WITH DIFFERENTIAL/PLATELET
Basophils Absolute: 0.1 10*3/uL (ref 0.0–0.2)
Basos: 1 %
EOS (ABSOLUTE): 0.4 10*3/uL (ref 0.0–0.4)
Eos: 5 %
Hematocrit: 34.8 % (ref 34.0–46.6)
Hemoglobin: 11.9 g/dL (ref 11.1–15.9)
Immature Grans (Abs): 0 10*3/uL (ref 0.0–0.1)
Immature Granulocytes: 0 %
Lymphocytes Absolute: 2.8 10*3/uL (ref 0.7–3.1)
Lymphs: 37 %
MCH: 30.4 pg (ref 26.6–33.0)
MCHC: 34.2 g/dL (ref 31.5–35.7)
MCV: 89 fL (ref 79–97)
Monocytes Absolute: 0.7 10*3/uL (ref 0.1–0.9)
Monocytes: 10 %
Neutrophils Absolute: 3.6 10*3/uL (ref 1.4–7.0)
Neutrophils: 47 %
Platelets: 342 10*3/uL (ref 150–450)
RBC: 3.92 x10E6/uL (ref 3.77–5.28)
RDW: 12.7 % (ref 11.7–15.4)
WBC: 7.6 10*3/uL (ref 3.4–10.8)

## 2018-10-30 LAB — LIPID PANEL
Chol/HDL Ratio: 3.5 ratio (ref 0.0–4.4)
Cholesterol, Total: 180 mg/dL (ref 100–199)
HDL: 51 mg/dL
LDL Calculated: 103 mg/dL — ABNORMAL HIGH (ref 0–99)
Triglycerides: 129 mg/dL (ref 0–149)
VLDL Cholesterol Cal: 26 mg/dL (ref 5–40)

## 2018-10-30 LAB — VITAMIN B12: Vitamin B-12: 652 pg/mL (ref 232–1245)

## 2018-10-30 LAB — FOLATE: Folate: 12.1 ng/mL (ref >3.0)

## 2018-10-30 LAB — TSH: TSH: 1.51 u[IU]/mL (ref 0.450–4.500)

## 2018-11-12 ENCOUNTER — Telehealth: Payer: Self-pay | Admitting: Family Medicine

## 2018-11-12 NOTE — Telephone Encounter (Signed)
LVM for pt daughter that the home health agency will call her to advise when they will be going out to her parents home. KH

## 2018-11-12 NOTE — Telephone Encounter (Signed)
Laurie Ward daughter called and state that home health has not come out there she wants you to check on that for her

## 2018-11-17 ENCOUNTER — Other Ambulatory Visit: Payer: Self-pay

## 2018-11-17 DIAGNOSIS — G3184 Mild cognitive impairment, so stated: Secondary | ICD-10-CM

## 2018-11-17 DIAGNOSIS — Z79899 Other long term (current) drug therapy: Secondary | ICD-10-CM

## 2018-11-20 ENCOUNTER — Other Ambulatory Visit: Payer: Self-pay

## 2018-11-20 DIAGNOSIS — G3184 Mild cognitive impairment, so stated: Secondary | ICD-10-CM

## 2018-11-20 DIAGNOSIS — Z79899 Other long term (current) drug therapy: Secondary | ICD-10-CM

## 2018-11-22 ENCOUNTER — Other Ambulatory Visit: Payer: Self-pay | Admitting: Family Medicine

## 2018-11-22 DIAGNOSIS — K219 Gastro-esophageal reflux disease without esophagitis: Secondary | ICD-10-CM

## 2018-11-30 ENCOUNTER — Other Ambulatory Visit: Payer: Self-pay

## 2018-11-30 ENCOUNTER — Encounter: Payer: Self-pay | Admitting: Family Medicine

## 2018-11-30 ENCOUNTER — Ambulatory Visit (INDEPENDENT_AMBULATORY_CARE_PROVIDER_SITE_OTHER): Payer: Medicare Other | Admitting: Family Medicine

## 2018-11-30 VITALS — BP 132/82 | HR 85 | Temp 98.2°F | Wt 136.0 lb

## 2018-11-30 DIAGNOSIS — S30861A Insect bite (nonvenomous) of abdominal wall, initial encounter: Secondary | ICD-10-CM | POA: Diagnosis not present

## 2018-11-30 DIAGNOSIS — K219 Gastro-esophageal reflux disease without esophagitis: Secondary | ICD-10-CM

## 2018-11-30 DIAGNOSIS — W57XXXA Bitten or stung by nonvenomous insect and other nonvenomous arthropods, initial encounter: Secondary | ICD-10-CM | POA: Diagnosis not present

## 2018-11-30 DIAGNOSIS — G3184 Mild cognitive impairment, so stated: Secondary | ICD-10-CM

## 2018-11-30 MED ORDER — ESOMEPRAZOLE MAGNESIUM 40 MG PO CPDR: 40.0000 mg | DELAYED_RELEASE_CAPSULE | Freq: Every day | ORAL | 3 refills | Status: DC

## 2018-11-30 NOTE — Patient Outreach (Signed)
[facility]) Care Management  11/30/2018  Laurie Ward 02/11/1927 [id number]  TELEPHONE SCREENING Referral date: 11/20/18 Referral source: primary MD Referral reason: cognitive impairment Insurance: [employer]  Telephone call to patient regarding primary MD referral. HIPAA verified with patient. RNCM introduced herself and explained reason for call.  RNCM discussed and offered [facility] care management services. Patient declined. She states she has a follow up visit with Dr. Redmond School today. Patient denies any needs at this time.  RNCM offered to mail patient [facility] care management brochure. Patient verbally agreed and confirmed mailing address.   PLAN:  RNCM will close due to patient being assessed and having no further needs.  RNCM will mail patient [facility] care management brochure.   Quinn Plowman RN,BSN,CCM [facility] Telephonic  [phone]

## 2018-11-30 NOTE — Progress Notes (Signed)
   Subjective:    Patient ID: Laurie Ward, female    DOB: 12-24-1926, 85 y.o.   MRN: [id number]  HPI She is here with her daughter.  She has been on Aricept for 2 weeks.  She and her daughter cannot notice any improvement in her cognitive function.  She does continue to use trazodone for sleep at night.  She also has a history of reflux disease and has been using Nexium regularly and apparently does need to use it on a daily basis.  She also has a lesion present in her left groin area that she states is growing. She was referred for home health assessment and apparently a call was made today but follow-up was not made.   Review of Systems     Objective:   Physical Exam Alert and in no distress.  Lesion in the left groin area was a tick and it was removed without difficulty.       Assessment & Plan:  Mild cognitive impairment  Gastroesophageal reflux disease, esophagitis presence not specified - Plan: esomeprazole (NEXIUM) 40 MG capsule  Tick bite of groin, initial encounter The daughter is to call me back in 2 weeks 1 day 5 mg medication runs out and I will increase her to 10 mg. Discussed the tick bite and to call back if difficulty with fever, headache, rash.  The daughter expressed understanding

## 2018-12-14 ENCOUNTER — Telehealth: Payer: Self-pay | Admitting: Family Medicine

## 2018-12-14 DIAGNOSIS — G3184 Mild cognitive impairment, so stated: Secondary | ICD-10-CM

## 2018-12-14 MED ORDER — DONEPEZIL HCL 10 MG PO TABS: 10.0000 mg | ORAL_TABLET | Freq: Every day | ORAL | 1 refills | Status: DC

## 2018-12-14 NOTE — Telephone Encounter (Signed)
Pt's daughter Velva Harman called and states that she was to call and give a update on pt. Pt is not any better. Velva Harman can be reached at [phone].

## 2018-12-14 NOTE — Telephone Encounter (Signed)
Let her daughter know that I am going to double the strength of the Aricept so take 2 of the present pills until they are gone and then start on the new prescription which will be 1 pill/day.  Call in about a month

## 2018-12-14 NOTE — Telephone Encounter (Signed)
LVM for pt  Daughter. [place]

## 2018-12-16 ENCOUNTER — Other Ambulatory Visit: Payer: Self-pay

## 2018-12-16 NOTE — Patient Outreach (Addendum)
[facility]) Care Management  12/16/2018  Laurie Ward 07-23-1926 [id number]  TELEPHONE SCREENING Referral date: 12/16/18 Referral source: family referral Referral reason: assess for needs Insurance: [place] health care  Telephone call to patient regarding referral. HIPAA verified with patient. HIPAA verified with patient. RNCM introduced herself and explained reason for call.  RNCM discussed and offered [facility] care management services. Patient states she does not have any needs at this time. Patient states she is following up with her primary MD regularly. She states she has transportation to her appointments.  Patient states she is managing her medications without difficulty.  Patient gave verbal permission to speak with her daughter, Laurie Ward regarding her health information.   Telephone call to patients daughter, Laurie Ward.  Daughter verified HIPAA for patient. Daughter states patient has Alzheimer's.  Daughter states patient may not understand the need to have services to assist her. Daughter states her 83 year old father is patients caregiver. She states her father is still driving and caring for himself and patient.  She states her father is assisting patient with her medications. Daughter reports patient was started on a new medication by her doctor 1 month ago but the medication was not picked up by her father so patient never took medication.  Daughter states patient is managing herself as far as bathing and dressing is concern. Daughter states she knows that patient is not taking care of herself the way she use to. Daughter states she lives approximately 1 hour away from patient. Daughter states patient has fallen at least 4-5 times within the past year. She states patient sustained bruising from the falls but no serious injury. Daughter states she is concerned about patients care because her father is getting more feeble. Marland Kitchen  RNCM discussed [facility] care management services  with patient. Daughter verbally agreed to follow up with social worker and pharmacy  ASSESSMENT; Patient will benefit from referral to social worker for community resources. Will have social worker contact patients daughter to discuss.  Per primary MD note patient has Mild cognitive impairment.  Patient is taking Aricept.   PLAN: RNCM will attempt contact with patients daughter. RNCM will refer patient / daughter, Laurie Ward to [facility] social worker.  RNCM will refer patient to pharmacy  Quinn Plowman RN,BSN,CCM [facility] Telephonic  [phone]

## 2018-12-17 ENCOUNTER — Other Ambulatory Visit: Payer: Self-pay

## 2018-12-17 ENCOUNTER — Other Ambulatory Visit: Payer: Self-pay | Admitting: Pharmacist

## 2018-12-17 NOTE — Patient Outreach (Signed)
[name]) Care Management  12/17/2018  Laurie Ward 11/21/1926 [id number]   Social work referral received from [employer].   "Referral to social worker to discuss community resources. Patient was referred by her daughter, [name]. Patient was previously referred by her primary MD for cognitive issues. Patient was able to complete screening appropriately and declined services. Patient gave authorization to speak with her daughter, [name] regarding her health information. Daughter states patient has Alzheimer's. Per primary MD note patient has Mild cognitive impairment. Daughter is concerned that patient is not caring for herself well. Daughter states her 26 year old father is taking care of patient but he to is becoming more feeble. Daughter states her father still drives and assists patient with managing her medications. Daughter states she lives 1 hour away and feels that patient needs someone to come check on patient and make sure patient is taking her medications appropriately. Patient is reports having hypertension that is managed with medication. Requesting social worker to contact patients daughter, [name] [phone] to discuss resources for in home care." Unsuccessful outreach to daughter today.  Left voicemail message.  Will attempt to reach again within four business days.  Ronn Melena, BSW Social Worker [phone]

## 2018-12-17 NOTE — Patient Outreach (Signed)
[facility]) Care Management  [facility]   12/17/2018  Mickle MalloryGloria S Imel Nov 03, 1926 [id number]  Reason for referral: Medication Management  [facility] Current insurance: [facility] A[employer]  PMHx includes but not limited to:  COPD, HTN, hypothyroidism, osteoporosis, GERD, mild cognitive impairment  Outreach:  Successful telephone call with patient's daughter, Franklin SinkRita.  HIPAA identifiers verified.   Subjective:  Daughter reports that patient has memory issues and often forgets if she has taken medications.  She may try to take her medications multiple times during the day if she finds her medication bottles.  She does not have a routine schedule and may be awake all night and sleep during the day.  Patient's spouse will give her all her medications at noon each day.  He does not currently use a pillbox.   Objective: The ASCVD Risk score Denman George(Goff DC Jr., et al., 2013) failed to calculate for the following reasons:   The 2013 ASCVD risk score is only valid for ages 83 to 83  Lab Results  Component Value Date   CREATININE 1.09 (H) 10/29/2018   CREATININE 1.13 (H) 03/20/2017   CREATININE 1.31 (H) 02/02/2016    Lab Results  Component Value Date   HGBA1C (H) 06/22/2010    6.3 (NOTE)                                                                       According to the ADA Clinical Practice Recommendations for 2011, when HbA1c is used as a screening test:   >=6.5%   Diagnostic of Diabetes Mellitus           (if abnormal result  is confirmed)  5.7-6.4%   Increased risk of developing Diabetes Mellitus  References:Diagnosis and Classification of Diabetes Mellitus,Diabetes Care,2011,34(Suppl 1):S62-S69 and Standards of Medical Care in         Diabetes - 2011,Diabetes Care,2011,34  (Suppl 1):S11-S61.    Lipid Panel     Component Value Date/Time   CHOL 180 10/29/2018 1515   TRIG 129 10/29/2018 1515   HDL 51 10/29/2018 1515   CHOLHDL 3.5  10/29/2018 1515   CHOLHDL 4.2 03/20/2017 0946   VLDL 30 02/02/2016 0001   LDLCALC 103 (H) 10/29/2018 1515   LDLCALC 108 (H) 03/20/2017 0946    BP Readings from Last 3 Encounters:  11/30/18 132/82  10/29/18 126/80  12/08/17 130/70    Allergies  Allergen Reactions  . Levaquin [Levofloxacin In D5w]     Medications Reviewed Today    Reviewed by Ronnald NianLalonde, John C, MD (Physician) on 11/30/18 at 1555  Med List Status: <None>  Medication Order Taking? Sig Documenting Provider Last Dose Status Informant  acetaminophen (TYLENOL) 500 MG tablet [id number] Yes Take 500 mg by mouth every 6 (six) hours as needed. [provider] Taking Active   donepezil (ARICEPT) 5 MG tablet [id number] Yes Take 1 tablet (5 mg total) by mouth at bedtime. Ronnald NianLalonde, John C, MD Taking Active   esomeprazole (NEXIUM) 40 MG capsule [id number] Yes Take 40 mg by mouth daily at 12 noon. [provider] Taking Active   levothyroxine (SYNTHROID) 75 MCG tablet [id number] Yes Take 1 tablet (75 mcg total) by mouth daily. Ronnald NianLalonde, John C,  MD Taking Active   lisinopril-hydrochlorothiazide (ZESTORETIC) 10-12.5 MG tablet [id number] Yes TAKE 1 TABLET BY MOUTH EVERY DAY Henson, Vickie L, NP-C Taking Active   polyethylene glycol powder (GLYCOLAX/MIRALAX) powder [id number] Yes Take 17 g by mouth daily. [name], MD Taking Active   simvastatin (ZOCOR) 40 MG tablet [id number] Yes TAKE 1 TABLET BY MOUTH EVERY DAY IN THE MORNING Henson, Vickie L, NP-C Taking Active   traZODone (DESYREL) 50 MG tablet [id number] No TAKE 1 TABLET BY MOUTH EVERY DAY AT BEDTIME AS NEEDED [name], MD Unknown Active           Assessment: Drugs sorted by system:  Neurologic/Psychologic: donepezil, trazodone  Cardiovascular: lisinopril-HCTZ, simvastatin  Gastrointestinal: esomeprazole  Endocrine: levothyroxine  Topical: polyethylene glycol  Pain: acetaminophen  Medication Review Findings:  . Medication adherence:  Patient  unable to self-manage medications due to cognitive impairment.  Spouse is 13 years old and also forgetful at times.  Medications being given together once daily due to need for simplified regimen.  Discussed possibility with daughter of organizing medications into a weekly pillbox and having a friend/neighbor/home health to stop by home to assist with giving patient medications once daily.  Also discussed possibly using a calendar to help patient with reminders that she has already taken medications that day.  Reviewed patient may need more support system in the near future.  Daughter aware and is going to discuss resources with social work. She states her parents cannot afford ALF at this time.    Plan: Will close [facility] pharmacy case as no other medication needs.  Will update [facility] LCSW.   Ralene Bathe, PharmD, [place] [phone]

## 2018-12-21 ENCOUNTER — Other Ambulatory Visit: Payer: Self-pay

## 2018-12-21 ENCOUNTER — Ambulatory Visit: Payer: Self-pay

## 2018-12-21 NOTE — Patient Outreach (Signed)
[facility]) Care Management  12/21/2018  MARCIANNE OZBUN 31-Mar-1927 [id number]   Second unsuccessful outreach to patient's daughter regarding social work referral to discuss community resources and/or in-home services for patient.   Left voicemail message.  Will attempt to reach again within four business days.  Ronn Melena, BSW Social Worker [phone]

## 2018-12-22 ENCOUNTER — Other Ambulatory Visit: Payer: Self-pay

## 2018-12-22 NOTE — Patient Outreach (Signed)
[facility]) Care Management  12/22/2018  Laurie Ward [birth year] [id number]   Third unsuccessful outreach to patient's daughter regarding social work referral to discuss community resources and/or in-home services for patient.   Left voicemail message.  Will close case if no response by 12/30/18  Ronn Melena, [place] Social Worker [phone]

## 2018-12-23 ENCOUNTER — Ambulatory Visit: Payer: Self-pay

## 2018-12-30 ENCOUNTER — Other Ambulatory Visit: Payer: Self-pay

## 2018-12-30 NOTE — Patient Outreach (Signed)
[facility]) Care Management  12/30/2018  Laurie Ward 1926-12-17 [id number]   Closing case due to inability to contact.  Ronn Melena, BSW Social Worker [phone]

## 2019-01-04 ENCOUNTER — Telehealth: Payer: Self-pay | Admitting: Family Medicine

## 2019-01-04 NOTE — Telephone Encounter (Signed)
Pt's daughter called, she spoke to Ms [place] and Pt complained of weakness," head feels weird" If she tries to look at anything she feels like she is going to pass out Daughter lives 1 1/2 away, daughter states they would not be able to do virtual visit they do not have Internet or anything Should be able to talk to her over the phone    Daughter wonders if could be related to [place]   [phone]  Daughter is available if you need to speak with her   No known exposures and no other symptoms  Please call

## 2019-01-04 NOTE — Telephone Encounter (Signed)
Let us do a virtual phone visit with her tomorrow

## 2019-01-05 NOTE — Telephone Encounter (Signed)
appt 01/06/19.

## 2019-01-06 ENCOUNTER — Ambulatory Visit (INDEPENDENT_AMBULATORY_CARE_PROVIDER_SITE_OTHER): Payer: Medicare Other | Admitting: Family Medicine

## 2019-01-06 ENCOUNTER — Other Ambulatory Visit: Payer: Self-pay

## 2019-01-06 ENCOUNTER — Encounter: Payer: Self-pay | Admitting: Family Medicine

## 2019-01-06 VITALS — BP 154/90 | HR 58 | Temp 98.2°F | Wt 133.8 lb

## 2019-01-06 DIAGNOSIS — H8110 Benign paroxysmal vertigo, unspecified ear: Secondary | ICD-10-CM | POA: Diagnosis not present

## 2019-01-06 NOTE — Progress Notes (Signed)
   Subjective:    Patient ID: Laurie Ward, female    DOB: 1927-01-27, 85 y.o.   MRN: [id number]  HPI She is here for consult concerning difficulty with the feeling of dizziness especially when she tips her head down.  This will only last for several seconds.  It was very difficult for her to fully explain the symptoms.  She also states that she has some difficulty with fatigue earlier in the week but states that she is doing fine now.  No fever, chills, sore throat, nausea, vomiting.   Review of Systems     Objective:   Physical Exam Alert and in no distress.  Neck flexion in all directions did not elicit any dizziness.  No carotid bruits are noted.  Throat is clear.  Neck is supple without adenopathy.  Cardiac exam shows regular rhythm without murmurs or gallops.  Lungs are clear to auscultation. Blood work was reviewed.     Assessment & Plan:  Benign paroxysmal positional vertigo, unspecified laterality - Plan: I explained to her and her husband that I thought this was a benign condition and to try and not bend her head forward however we can pursue this further if she has further difficulty.  Cautioned her on being dizzy and potentially falling and breaking a hip.

## 2019-01-06 NOTE — Patient Instructions (Signed)
Your dizziness is related to head position especially when you bend your head downward.  Try to avoid doing that as much as possible.

## 2019-01-21 ENCOUNTER — Other Ambulatory Visit: Payer: Self-pay | Admitting: Family Medicine

## 2019-01-21 DIAGNOSIS — I10 Essential (primary) hypertension: Secondary | ICD-10-CM

## 2019-01-21 DIAGNOSIS — E785 Hyperlipidemia, unspecified: Secondary | ICD-10-CM

## 2019-02-15 ENCOUNTER — Encounter: Payer: Self-pay | Admitting: Family Medicine

## 2019-02-15 ENCOUNTER — Ambulatory Visit (INDEPENDENT_AMBULATORY_CARE_PROVIDER_SITE_OTHER): Payer: Medicare Other | Admitting: Family Medicine

## 2019-02-15 ENCOUNTER — Other Ambulatory Visit: Payer: Self-pay

## 2019-02-15 VITALS — BP 120/82 | HR 64 | Temp 98.9°F | Wt 133.6 lb

## 2019-02-15 DIAGNOSIS — M25561 Pain in right knee: Secondary | ICD-10-CM

## 2019-02-15 NOTE — Progress Notes (Signed)
   Subjective:    Patient ID: Laurie Ward, female    DOB: 1926/10/14, 85 y.o.   MRN: [id number]  HPI She is here for evaluation of right knee pain.  Apparently she injured her knee approximately 3 weeks ago when she landed on her knee.  She did use Advil 2 tablets once or twice per day which did help.  She states that now she is roughly 80% better. At the end of the encounter, she mentioned vague symptoms of feeling "sick in the head" and described bending her head forward and feeling the above symptoms.  When asked for more details on this.  It was very hard to get a good feeling of what she meant by that.   Review of Systems     Objective:   Physical Exam Alert and in no distress. Tympanic membranes and canals are normal. Pharyngeal area is normal. Neck is supple without adenopathy or thyromegaly.  No carotid bruits noted cardiac exam shows a regular sinus rhythm without murmurs or gallops. Lungs are clear to auscultation. Right knee exam showed no effusion.  Anterior drawer negative.  Slight tenderness over the lateral knee joint.  McMurray's testing was equivocal.       Assessment & Plan:  Acute pain of right knee I recommended conservative care for this with continuing use of 2 Advil twice per day.  Suggested that this should take away the pain and if continued difficulty, call me. I then explained that I could not get a good handle on exactly what symptoms she was having or the cause of them.  Suggested that she try to keep active record of this and return in a week or 2 to see how she is doing. Over 25 minutes, greater than 50% spent in counseling and coordination of care.

## 2019-02-15 NOTE — Patient Instructions (Signed)
You can take 2 Advil 3 times per day as needed for pain The next time he had that sick in your head feeling make an appointment so I can look at you

## 2019-04-17 ENCOUNTER — Other Ambulatory Visit: Payer: Self-pay | Admitting: Family Medicine

## 2019-04-17 DIAGNOSIS — E785 Hyperlipidemia, unspecified: Secondary | ICD-10-CM

## 2019-04-17 DIAGNOSIS — I10 Essential (primary) hypertension: Secondary | ICD-10-CM

## 2019-06-02 DIAGNOSIS — H04123 Dry eye syndrome of bilateral lacrimal glands: Secondary | ICD-10-CM | POA: Diagnosis not present

## 2019-06-04 ENCOUNTER — Ambulatory Visit (INDEPENDENT_AMBULATORY_CARE_PROVIDER_SITE_OTHER): Payer: Medicare Other | Admitting: Family Medicine

## 2019-06-04 ENCOUNTER — Ambulatory Visit
Admission: RE | Admit: 2019-06-04 | Discharge: 2019-06-04 | Disposition: A | Discharge status: Home / Self Care | Payer: Medicare Other | Source: Ambulatory Visit | Attending: Family Medicine | Admitting: Family Medicine

## 2019-06-04 ENCOUNTER — Other Ambulatory Visit: Payer: Self-pay

## 2019-06-04 VITALS — BP 144/88 | HR 67 | Temp 98.0°F | Wt 137.0 lb

## 2019-06-04 DIAGNOSIS — M545 Low back pain, unspecified: Secondary | ICD-10-CM

## 2019-06-04 DIAGNOSIS — M5136 Other intervertebral disc degeneration, lumbar region: Secondary | ICD-10-CM

## 2019-06-04 DIAGNOSIS — I700 Atherosclerosis of aorta: Secondary | ICD-10-CM

## 2019-06-04 MED ORDER — TRAMADOL HCL 50 MG PO TABS: 50.0000 mg | ORAL_TABLET | Freq: Three times a day (TID) | ORAL | 0 refills | Status: AC | PRN

## 2019-06-04 NOTE — Progress Notes (Signed)
   Subjective:    Patient ID: Laurie Ward, female    DOB: 09-15-1926, 85 y.o.   MRN: [id number]  HPI She complains of a 2-day history of mid low back pain.  No history of falling or injury to her back.  No bowel or bladder problems.   Review of Systems     Objective:   Physical Exam Alert and complaining of back pain.  Tender to palpation over the mid low back area.  Hip motion is good.  Negative straight leg raising. X-ray changes as listed below.      Assessment & Plan:  Acute midline low back pain without sciatica - Plan: DG Lumbar Spine Complete, traMADol (ULTRAM) 50 MG tablet, Ambulatory referral to Orthopedic Surgery  Atherosclerosis of aorta (HCC)  Degenerative disc disease, lumbar - Plan: traMADol (ULTRAM) 50 MG tablet, Ambulatory referral to Orthopedic Surgery Explained the need to be very careful with the Toradol in terms of CNS effect.  With the severity of her back, I think orthopedic referral would be appropriate.

## 2019-06-04 NOTE — Patient Instructions (Signed)
Take 2 Tylenol 4 times per day as needed for pain.

## 2019-06-10 ENCOUNTER — Encounter: Payer: Self-pay | Admitting: Family Medicine

## 2019-06-10 ENCOUNTER — Other Ambulatory Visit: Payer: Self-pay

## 2019-06-10 ENCOUNTER — Ambulatory Visit (INDEPENDENT_AMBULATORY_CARE_PROVIDER_SITE_OTHER): Payer: Medicare Other | Admitting: Family Medicine

## 2019-06-10 ENCOUNTER — Ambulatory Visit: Payer: Medicare Other | Admitting: Family Medicine

## 2019-06-10 DIAGNOSIS — M542 Cervicalgia: Secondary | ICD-10-CM | POA: Diagnosis not present

## 2019-06-10 DIAGNOSIS — M545 Low back pain, unspecified: Secondary | ICD-10-CM

## 2019-06-10 NOTE — Progress Notes (Signed)
Office Visit Note   Patient: Laurie Ward           Date of Birth: May 10, 1927           MRN: [id number] Visit Date: 06/10/2019 Requested by: Ronnald Nian, MD [address] [place],  [place] [address] PCP: Ronnald Nian, MD  Subjective: Chief Complaint  Patient presents with  . Lower Back - Pain    Pain in the lower back x 1 month. Larey Seat, but unsure how she landed. Pain radiates down posterior left leg to knee. No numbness/tingling in the legs/feet.    HPI: She is here with neck and lower back pain.  About a month ago she fell, she is not exactly sure how it happened but she thinks she fell forward and she thinks she lost consciousness.  She did not seek treatment but after a few weeks she was not getting better so she went to Dr. Susann Givens who ordered x-rays of the lumbar spine and referred her here.  Denies any radicular pain.  It bothers her when lying in bed, and when walking.  Tramadol does seem to help.              ROS: No fevers or chills.  All other systems were reviewed and are negative.  Objective: Vital Signs: There were no vitals taken for this visit.  Physical Exam:  General:  Alert and oriented, in no acute distress. Pulm:  Breathing unlabored. Psy:  Normal mood, congruent affect. Skin: No bruising seen. Neck: She does have some pain with neck movement, but rotating her neck also causes her to feel dizzy so we did not put her through extensive range of motion testing.  She has some tenderness in the cervical paraspinous muscles on both sides.  No significant spinous process tenderness other than C7 which is mildly tender.  Upper extremity strength and reflexes are normal. Low back: No significant thoracic spinous process tenderness.  Slight tenderness near the L5-S1 level.  Negative straight leg raise, lower extremity strength and reflexes are normal.  Imaging: None today.  Recent lumbar x-rays were reviewed showing spondylosis but no obvious acute  fracture.  Assessment & Plan: 1.  1 month status post fall with neck and low back pain, probably sprain/strain injury with aggravation of underlying spondylosis. -We will try gentle physical therapy at [facility] [place]. -Continue with tramadol as needed. -If neck pain persists, x-rays and MRI scan.  Otherwise follow-up as needed.     Procedures: No procedures performed  No notes on file     PMFS History: Patient Active Problem List   Diagnosis Date Noted  . Mild cognitive impairment 08/07/2017  . Esophageal reflux 02/02/2016  . Hyperlipidemia LDL goal <100 02/02/2016  . Osteoporosis 05/22/2011  . COPD (chronic obstructive pulmonary disease) (HCC) 05/03/2011  . Hypertension 05/03/2011  . Hypothyroid 05/03/2011   Past Medical History:  Diagnosis Date  . Arthritis   . Asthma   . COPD (chronic obstructive pulmonary disease) (HCC)   . Former smoker   . GERD (gastroesophageal reflux disease)   . HH (hiatus hernia)   . HH (hiatus hernia)   . Hypercholesterolemia   . Hypertension   . Osteoporosis    OSTEOPENIA  . Thyroid disease    HYPOTHYROID    History reviewed. No pertinent family history.  Past Surgical History:  Procedure Laterality Date  . ABDOMINAL HYSTERECTOMY    . CHOLECYSTECTOMY    . COLONOSCOPY  2003  Dr. Collene Mares   Social History   Occupational History  . Not on file  Tobacco Use  . Smoking status: Former Research scientist (life sciences)  . Smokeless tobacco: Never Used  Substance and Sexual Activity  . Alcohol use: No  . Drug use: No  . Sexual activity: Not on file

## 2019-06-15 DIAGNOSIS — M545 Low back pain: Secondary | ICD-10-CM | POA: Diagnosis not present

## 2019-06-15 DIAGNOSIS — M5416 Radiculopathy, lumbar region: Secondary | ICD-10-CM | POA: Diagnosis not present

## 2019-06-15 DIAGNOSIS — M542 Cervicalgia: Secondary | ICD-10-CM | POA: Diagnosis not present

## 2019-06-28 ENCOUNTER — Other Ambulatory Visit: Payer: Self-pay | Admitting: Family Medicine

## 2019-06-28 NOTE — Telephone Encounter (Signed)
[facility] is requesting to fill pt aricept. Please advise [facility]

## 2019-07-01 DIAGNOSIS — H04123 Dry eye syndrome of bilateral lacrimal glands: Secondary | ICD-10-CM | POA: Diagnosis not present

## 2019-07-19 ENCOUNTER — Other Ambulatory Visit: Payer: Self-pay | Admitting: Family Medicine

## 2019-07-19 DIAGNOSIS — I10 Essential (primary) hypertension: Secondary | ICD-10-CM

## 2019-07-27 ENCOUNTER — Other Ambulatory Visit: Payer: Self-pay | Admitting: Family Medicine

## 2019-07-27 DIAGNOSIS — E039 Hypothyroidism, unspecified: Secondary | ICD-10-CM

## 2019-07-27 DIAGNOSIS — E785 Hyperlipidemia, unspecified: Secondary | ICD-10-CM

## 2019-10-08 ENCOUNTER — Other Ambulatory Visit: Payer: Self-pay | Admitting: Family Medicine

## 2019-10-08 NOTE — Telephone Encounter (Signed)
[facility] is requesting to fill pt donepezil. Please advise [facility]

## 2019-10-26 ENCOUNTER — Other Ambulatory Visit: Payer: Self-pay | Admitting: Family Medicine

## 2019-10-26 DIAGNOSIS — I10 Essential (primary) hypertension: Secondary | ICD-10-CM

## 2019-10-26 NOTE — Telephone Encounter (Signed)
Is this ok to refill pt. Last apt was 06/04/19 just an acute and pt. Doesn't have a future apt.

## 2019-11-04 ENCOUNTER — Telehealth: Payer: Self-pay

## 2019-11-04 NOTE — Telephone Encounter (Signed)
Pt was called to advise it is time for an appt. She advised me that these days are over for and she is fine and she hopes I am and then pt hung up. A letter will be mailed advising of the need. KH

## 2019-11-15 ENCOUNTER — Other Ambulatory Visit: Payer: Self-pay

## 2019-11-15 ENCOUNTER — Telehealth: Payer: Self-pay | Admitting: Family Medicine

## 2019-11-15 DIAGNOSIS — E785 Hyperlipidemia, unspecified: Secondary | ICD-10-CM

## 2019-11-15 MED ORDER — SIMVASTATIN 40 MG PO TABS: ORAL_TABLET | ORAL | 0 refills | Status: DC

## 2019-11-15 NOTE — Telephone Encounter (Signed)
Pharmacy sent refill request for simvastatin 40mg  please send to [id number] - [place] ([address]), [place] - [address]

## 2019-12-01 ENCOUNTER — Encounter: Payer: Self-pay | Admitting: Family Medicine

## 2019-12-01 ENCOUNTER — Ambulatory Visit (INDEPENDENT_AMBULATORY_CARE_PROVIDER_SITE_OTHER): Payer: Medicare Other | Admitting: Family Medicine

## 2019-12-01 VITALS — BP 158/92 | HR 66 | Temp 98.2°F | Wt 137.0 lb

## 2019-12-01 DIAGNOSIS — I10 Essential (primary) hypertension: Secondary | ICD-10-CM | POA: Diagnosis not present

## 2019-12-01 MED ORDER — LISINOPRIL-HYDROCHLOROTHIAZIDE 20-12.5 MG PO TABS: 1.0000 | ORAL_TABLET | Freq: Every day | ORAL | 3 refills | Status: DC

## 2019-12-01 NOTE — Patient Instructions (Signed)
Start taking the new lisinopril bottle and throw the other one away you can

## 2019-12-01 NOTE — Progress Notes (Signed)
   Subjective:    Patient ID: Laurie Ward, female    DOB: 1926/09/10, 86 y.o.   MRN: [id number]  HPI She is here for recheck on her blood pressure.  She continues on lisinopril/HCTZ and is having no difficulty with that.  She did not bring her medications with her.   Review of Systems     Objective:   Physical Exam Alert and in no distress.  Blood pressure is recorded.       Assessment & Plan:  Essential hypertension - Plan: lisinopril-hydrochlorothiazide (ZESTORETIC) 20-12.5 MG tablet I will increase her Zestoretic and recheck that in a couple of months.  Encouraged her to throw the lower strength medication away.

## 2020-01-31 ENCOUNTER — Ambulatory Visit: Payer: Medicare Other | Admitting: Family Medicine

## 2020-03-29 ENCOUNTER — Other Ambulatory Visit (INDEPENDENT_AMBULATORY_CARE_PROVIDER_SITE_OTHER): Payer: Medicare Other

## 2020-03-29 DIAGNOSIS — Z23 Encounter for immunization: Secondary | ICD-10-CM | POA: Diagnosis not present

## 2020-03-29 NOTE — Progress Notes (Unsigned)
Acute Office Visit  Subjective:    Patient ID: Laurie Ward, female    DOB: 03/17/1927, 86 y.o.   MRN: [id number]  No chief complaint on file.   HPI Patient is in today for ***  Past Medical History:  Diagnosis Date  . Arthritis   . Asthma   . COPD (chronic obstructive pulmonary disease) (HCC)   . Former smoker   . GERD (gastroesophageal reflux disease)   . HH (hiatus hernia)   . HH (hiatus hernia)   . Hypercholesterolemia   . Hypertension   . Osteoporosis    OSTEOPENIA  . Thyroid disease    HYPOTHYROID    Past Surgical History:  Procedure Laterality Date  . ABDOMINAL HYSTERECTOMY    . CHOLECYSTECTOMY    . COLONOSCOPY  2003   Dr. Loreta Ave    No family history on file.  Social History   Socioeconomic History  . Marital status: Married    Spouse name: Not on file  . Number of children: Not on file  . Years of education: Not on file  . Highest education level: Not on file  Occupational History  . Not on file  Tobacco Use  . Smoking status: Former Games developer  . Smokeless tobacco: Never Used  Substance and Sexual Activity  . Alcohol use: No  . Drug use: No  . Sexual activity: Not on file  Other Topics Concern  . Not on file  Social History Narrative  . Not on file   Social Determinants of Health   Financial Resource Strain:   . Difficulty of Paying Living Expenses: Not on file  Food Insecurity:   . Worried About Programme researcher, broadcasting/film/video in the Last Year: Not on file  . Ran Out of Food in the Last Year: Not on file  Transportation Needs:   . Lack of Transportation (Medical): Not on file  . Lack of Transportation (Non-Medical): Not on file  Physical Activity:   . Days of Exercise per Week: Not on file  . Minutes of Exercise per Session: Not on file  Stress:   . Feeling of Stress : Not on file  Social Connections:   . Frequency of Communication with Friends and Family: Not on file  . Frequency of Social Gatherings with Friends and Family: Not on file  .  Attends Religious Services: Not on file  . Active Member of Clubs or Organizations: Not on file  . Attends Banker Meetings: Not on file  . Marital Status: Not on file  Intimate Partner Violence:   . Fear of Current or Ex-Partner: Not on file  . Emotionally Abused: Not on file  . Physically Abused: Not on file  . Sexually Abused: Not on file    Outpatient Medications Prior to Visit  Medication Sig Dispense Refill  . acetaminophen (TYLENOL) 500 MG tablet Take 500 mg by mouth every 6 (six) hours as needed.    . donepezil (ARICEPT) 10 MG tablet Take 1 tablet (10 mg total) by mouth at bedtime. 30 tablet 1  . donepezil (ARICEPT) 5 MG tablet TAKE 1 TABLET BY MOUTH ONCE DAILY AT BEDTIME 90 tablet 0  . esomeprazole (NEXIUM) 40 MG capsule Take 1 capsule (40 mg total) by mouth daily at 12 noon. 90 capsule 3  . EUTHYROX 75 MCG tablet Take 1 tablet by mouth once daily 90 tablet 0  . FLUZONE HIGH-DOSE QUADRIVALENT 0.7 ML SUSY     . lisinopril-hydrochlorothiazide (ZESTORETIC) 20-12.5 MG tablet Take  1 tablet by mouth daily. 90 tablet 3  . polyethylene glycol powder (GLYCOLAX/MIRALAX) powder Take 17 g by mouth daily. 3350 g 11  . prednisoLONE acetate (PRED FORTE) 1 % ophthalmic suspension     . simvastatin (ZOCOR) 40 MG tablet TAKE 1 TABLET BY MOUTH ONCE DAILY IN THE MORNING 90 tablet 0  . traZODone (DESYREL) 50 MG tablet TAKE 1 TABLET BY MOUTH EVERY DAY AT BEDTIME AS NEEDED 90 tablet 1   No facility-administered medications prior to visit.    Allergies  Allergen Reactions  . Levaquin [Levofloxacin In D5w]     Review of Systems     Objective:    Physical Exam  There were no vitals taken for this visit. Wt Readings from Last 3 Encounters:  12/01/19 137 lb (62.1 kg)  06/04/19 137 lb (62.1 kg)  02/15/19 133 lb 9.6 oz (60.6 kg)    Health Maintenance Due  Topic Date Due  . TETANUS/TDAP  02/15/2017  . INFLUENZA VACCINE  01/23/2020    There are no preventive care reminders  to display for this patient.   Lab Results  Component Value Date   TSH 1.510 10/29/2018   Lab Results  Component Value Date   WBC 7.6 10/29/2018   HGB 11.9 10/29/2018   HCT 34.8 10/29/2018   MCV 89 10/29/2018   PLT 342 10/29/2018   Lab Results  Component Value Date   NA 140 10/29/2018   K 4.3 10/29/2018   CO2 21 10/29/2018   GLUCOSE 97 10/29/2018   BUN 17 10/29/2018   CREATININE 1.09 (H) 10/29/2018   BILITOT 0.2 10/29/2018   ALKPHOS 59 10/29/2018   AST 18 10/29/2018   ALT 10 10/29/2018   PROT 7.1 10/29/2018   ALBUMIN 4.4 10/29/2018   CALCIUM 9.5 10/29/2018   Lab Results  Component Value Date   CHOL 180 10/29/2018   Lab Results  Component Value Date   HDL 51 10/29/2018   Lab Results  Component Value Date   LDLCALC 103 (H) 10/29/2018   Lab Results  Component Value Date   TRIG 129 10/29/2018   Lab Results  Component Value Date   CHOLHDL 3.5 10/29/2018   Lab Results  Component Value Date   HGBA1C (H) 06/22/2010    6.3 (NOTE)                                                                       According to the ADA Clinical Practice Recommendations for 2011, when HbA1c is used as a screening test:   >=6.5%   Diagnostic of Diabetes Mellitus           (if abnormal result  is confirmed)  5.7-6.4%   Increased risk of developing Diabetes Mellitus  References:Diagnosis and Classification of Diabetes Mellitus,Diabetes Care,2011,34(Suppl 1):S62-S69 and Standards of Medical Care in         Diabetes - 2011,Diabetes Care,2011,34  (Suppl 1):S11-S61.       Assessment & Plan:   Problem List Items Addressed This Visit    None       No orders of the defined types were placed in this encounter.    Victorio Palm, CMA

## 2020-04-04 DIAGNOSIS — S61452A Open bite of left hand, initial encounter: Secondary | ICD-10-CM | POA: Diagnosis not present

## 2020-04-06 ENCOUNTER — Encounter (HOSPITAL_COMMUNITY)
Admission: EM | Disposition: A | Discharge status: Home Health | Payer: Self-pay | Source: Home / Self Care | Attending: Internal Medicine

## 2020-04-06 ENCOUNTER — Inpatient Hospital Stay (HOSPITAL_COMMUNITY)
Admission: EM | Admit: 2020-04-06 | Discharge: 2020-04-09 | DRG: 605 | Disposition: A | Discharge status: Home Health | Payer: Medicare Other | Attending: Internal Medicine | Admitting: Internal Medicine

## 2020-04-06 ENCOUNTER — Encounter (HOSPITAL_COMMUNITY): Payer: Self-pay | Admitting: *Deleted

## 2020-04-06 ENCOUNTER — Observation Stay (HOSPITAL_COMMUNITY): Payer: Medicare Other | Admitting: Certified Registered Nurse Anesthetist

## 2020-04-06 DIAGNOSIS — Z87891 Personal history of nicotine dependence: Secondary | ICD-10-CM | POA: Diagnosis not present

## 2020-04-06 DIAGNOSIS — Z79899 Other long term (current) drug therapy: Secondary | ICD-10-CM | POA: Diagnosis not present

## 2020-04-06 DIAGNOSIS — F0280 Dementia in other diseases classified elsewhere without behavioral disturbance: Secondary | ICD-10-CM | POA: Diagnosis present

## 2020-04-06 DIAGNOSIS — M1990 Unspecified osteoarthritis, unspecified site: Secondary | ICD-10-CM | POA: Diagnosis present

## 2020-04-06 DIAGNOSIS — Z20822 Contact with and (suspected) exposure to covid-19: Secondary | ICD-10-CM | POA: Diagnosis present

## 2020-04-06 DIAGNOSIS — S61451A Open bite of right hand, initial encounter: Secondary | ICD-10-CM | POA: Diagnosis not present

## 2020-04-06 DIAGNOSIS — Z79890 Hormone replacement therapy (postmenopausal): Secondary | ICD-10-CM

## 2020-04-06 DIAGNOSIS — L03818 Cellulitis of other sites: Secondary | ICD-10-CM

## 2020-04-06 DIAGNOSIS — J449 Chronic obstructive pulmonary disease, unspecified: Secondary | ICD-10-CM | POA: Diagnosis present

## 2020-04-06 DIAGNOSIS — N1832 Chronic kidney disease, stage 3b: Secondary | ICD-10-CM | POA: Diagnosis present

## 2020-04-06 DIAGNOSIS — Z23 Encounter for immunization: Secondary | ICD-10-CM

## 2020-04-06 DIAGNOSIS — K449 Diaphragmatic hernia without obstruction or gangrene: Secondary | ICD-10-CM | POA: Diagnosis present

## 2020-04-06 DIAGNOSIS — M810 Age-related osteoporosis without current pathological fracture: Secondary | ICD-10-CM | POA: Diagnosis present

## 2020-04-06 DIAGNOSIS — G309 Alzheimer's disease, unspecified: Secondary | ICD-10-CM | POA: Diagnosis present

## 2020-04-06 DIAGNOSIS — E7800 Pure hypercholesterolemia, unspecified: Secondary | ICD-10-CM | POA: Diagnosis not present

## 2020-04-06 DIAGNOSIS — Z881 Allergy status to other antibiotic agents status: Secondary | ICD-10-CM | POA: Diagnosis not present

## 2020-04-06 DIAGNOSIS — L089 Local infection of the skin and subcutaneous tissue, unspecified: Secondary | ICD-10-CM | POA: Diagnosis not present

## 2020-04-06 DIAGNOSIS — E039 Hypothyroidism, unspecified: Secondary | ICD-10-CM | POA: Diagnosis not present

## 2020-04-06 DIAGNOSIS — K219 Gastro-esophageal reflux disease without esophagitis: Secondary | ICD-10-CM | POA: Diagnosis not present

## 2020-04-06 DIAGNOSIS — W540XXA Bitten by dog, initial encounter: Secondary | ICD-10-CM

## 2020-04-06 DIAGNOSIS — Z659 Problem related to unspecified psychosocial circumstances: Secondary | ICD-10-CM | POA: Diagnosis not present

## 2020-04-06 DIAGNOSIS — S61431A Puncture wound without foreign body of right hand, initial encounter: Principal | ICD-10-CM | POA: Diagnosis present

## 2020-04-06 DIAGNOSIS — I10 Essential (primary) hypertension: Secondary | ICD-10-CM | POA: Diagnosis present

## 2020-04-06 DIAGNOSIS — E876 Hypokalemia: Secondary | ICD-10-CM

## 2020-04-06 DIAGNOSIS — L0390 Cellulitis, unspecified: Secondary | ICD-10-CM | POA: Diagnosis present

## 2020-04-06 DIAGNOSIS — I129 Hypertensive chronic kidney disease with stage 1 through stage 4 chronic kidney disease, or unspecified chronic kidney disease: Secondary | ICD-10-CM | POA: Diagnosis not present

## 2020-04-06 DIAGNOSIS — E785 Hyperlipidemia, unspecified: Secondary | ICD-10-CM | POA: Diagnosis present

## 2020-04-06 DIAGNOSIS — N179 Acute kidney failure, unspecified: Secondary | ICD-10-CM | POA: Diagnosis present

## 2020-04-06 DIAGNOSIS — L03113 Cellulitis of right upper limb: Secondary | ICD-10-CM | POA: Diagnosis not present

## 2020-04-06 HISTORY — PX: I & D EXTREMITY: SHX5045

## 2020-04-06 LAB — BASIC METABOLIC PANEL
Anion gap: 14 (ref 5–15)
BUN: 29 mg/dL — ABNORMAL HIGH (ref 8–23)
CO2: 22 mmol/L (ref 22–32)
Calcium: 9 mg/dL (ref 8.9–10.3)
Chloride: 101 mmol/L (ref 98–111)
Creatinine, Ser: 1.53 mg/dL — ABNORMAL HIGH (ref 0.44–1.00)
GFR, Estimated: 29 mL/min — ABNORMAL LOW (ref >60)
Glucose, Bld: 110 mg/dL — ABNORMAL HIGH (ref 70–99)
Potassium: 3.4 mmol/L — ABNORMAL LOW (ref 3.5–5.1)
Sodium: 137 mmol/L (ref 135–145)

## 2020-04-06 LAB — CBC WITH DIFFERENTIAL/PLATELET
Abs Immature Granulocytes: 0.04 10*3/uL (ref 0.00–0.07)
Basophils Absolute: 0 10*3/uL (ref 0.0–0.1)
Basophils Relative: 0 %
Eosinophils Absolute: 0 10*3/uL (ref 0.0–0.5)
Eosinophils Relative: 0 %
HCT: 37.6 % (ref 36.0–46.0)
Hemoglobin: 12.4 g/dL (ref 12.0–15.0)
Immature Granulocytes: 0 %
Lymphocytes Relative: 16 %
Lymphs Abs: 1.8 10*3/uL (ref 0.7–4.0)
MCH: 29.7 pg (ref 26.0–34.0)
MCHC: 33 g/dL (ref 30.0–36.0)
MCV: 90.2 fL (ref 80.0–100.0)
Monocytes Absolute: 1.2 10*3/uL — ABNORMAL HIGH (ref 0.1–1.0)
Monocytes Relative: 11 %
Neutro Abs: 8 10*3/uL — ABNORMAL HIGH (ref 1.7–7.7)
Neutrophils Relative %: 73 %
Platelets: 352 10*3/uL (ref 150–400)
RBC: 4.17 MIL/uL (ref 3.87–5.11)
RDW: 13.9 % (ref 11.5–15.5)
WBC: 11 10*3/uL — ABNORMAL HIGH (ref 4.0–10.5)
nRBC: 0 % (ref 0.0–0.2)

## 2020-04-06 LAB — RESPIRATORY PANEL BY RT PCR (FLU A&B, COVID)
Influenza A by PCR: NEGATIVE
Influenza B by PCR: NEGATIVE
SARS Coronavirus 2 by RT PCR: NEGATIVE

## 2020-04-06 SURGERY — IRRIGATION AND DEBRIDEMENT EXTREMITY
Anesthesia: General | Laterality: Right

## 2020-04-06 MED ORDER — BUPIVACAINE-EPINEPHRINE 0.25% -1:200000 IJ SOLN
INTRAMUSCULAR | Status: AC
  Filled 2020-04-06: qty 1

## 2020-04-06 MED ORDER — PROPOFOL 10 MG/ML IV BOLUS
INTRAVENOUS | Status: AC
  Filled 2020-04-06: qty 20

## 2020-04-06 MED ORDER — SODIUM CHLORIDE 0.9 % IV SOLN
3.0000 g | Freq: Two times a day (BID) | INTRAVENOUS | Status: DC
  Administered 2020-04-06 – 2020-04-09 (×6): 3 g via INTRAVENOUS
  Filled 2020-04-06: qty 3
  Filled 2020-04-06 (×2): qty 8
  Filled 2020-04-06 (×4): qty 3

## 2020-04-06 MED ORDER — FENTANYL CITRATE (PF) 100 MCG/2ML IJ SOLN: 25.0000 ug | INTRAMUSCULAR | Status: DC | PRN

## 2020-04-06 MED ORDER — LIDOCAINE-EPINEPHRINE 1 %-1:100000 IJ SOLN
INTRAMUSCULAR | Status: AC
  Filled 2020-04-06: qty 1

## 2020-04-06 MED ORDER — POVIDONE-IODINE 10 % EX SWAB
2.0000 "application " | Freq: Once | CUTANEOUS | Status: AC
  Administered 2020-04-06: 2 via TOPICAL

## 2020-04-06 MED ORDER — 0.9 % SODIUM CHLORIDE (POUR BTL) OPTIME
TOPICAL | Status: DC | PRN
  Administered 2020-04-06: 1000 mL

## 2020-04-06 MED ORDER — ONDANSETRON HCL 4 MG/2ML IJ SOLN
INTRAMUSCULAR | Status: DC | PRN
  Administered 2020-04-06: 4 mg via INTRAVENOUS

## 2020-04-06 MED ORDER — FENTANYL CITRATE (PF) 100 MCG/2ML IJ SOLN
INTRAMUSCULAR | Status: DC | PRN
  Administered 2020-04-06: 25 ug via INTRAVENOUS
  Administered 2020-04-06: 50 ug via INTRAVENOUS
  Administered 2020-04-06 (×3): 25 ug via INTRAVENOUS

## 2020-04-06 MED ORDER — ACETAMINOPHEN 160 MG/5ML PO SOLN: 325.0000 mg | ORAL | Status: DC | PRN

## 2020-04-06 MED ORDER — LIDOCAINE 2% (20 MG/ML) 5 ML SYRINGE
INTRAMUSCULAR | Status: DC | PRN
  Administered 2020-04-06: 50 mg via INTRAVENOUS

## 2020-04-06 MED ORDER — EPHEDRINE SULFATE 50 MG/ML IJ SOLN
INTRAMUSCULAR | Status: DC | PRN
  Administered 2020-04-06: 10 mg via INTRAVENOUS

## 2020-04-06 MED ORDER — PHENYLEPHRINE HCL-NACL 10-0.9 MG/250ML-% IV SOLN
INTRAVENOUS | Status: DC | PRN
  Administered 2020-04-06: 25 ug/min via INTRAVENOUS

## 2020-04-06 MED ORDER — HEPARIN SODIUM (PORCINE) 5000 UNIT/ML IJ SOLN
5000.0000 [IU] | Freq: Three times a day (TID) | INTRAMUSCULAR | Status: DC
  Administered 2020-04-06 – 2020-04-09 (×8): 5000 [IU] via SUBCUTANEOUS
  Filled 2020-04-06 (×8): qty 1

## 2020-04-06 MED ORDER — PROPOFOL 10 MG/ML IV BOLUS
INTRAVENOUS | Status: DC | PRN
  Administered 2020-04-06: 70 mg via INTRAVENOUS

## 2020-04-06 MED ORDER — ACETAMINOPHEN 325 MG PO TABS: 325.0000 mg | ORAL_TABLET | ORAL | Status: DC | PRN

## 2020-04-06 MED ORDER — ONDANSETRON HCL 4 MG PO TABS: 4.0000 mg | ORAL_TABLET | Freq: Four times a day (QID) | ORAL | Status: DC | PRN

## 2020-04-06 MED ORDER — TETANUS-DIPHTH-ACELL PERTUSSIS 5-2.5-18.5 LF-MCG/0.5 IM SUSP
0.5000 mL | Freq: Once | INTRAMUSCULAR | Status: AC
  Administered 2020-04-06: 0.5 mL via INTRAMUSCULAR
  Filled 2020-04-06: qty 0.5

## 2020-04-06 MED ORDER — ACETAMINOPHEN 325 MG PO TABS
650.0000 mg | ORAL_TABLET | Freq: Four times a day (QID) | ORAL | Status: DC | PRN
  Administered 2020-04-07 – 2020-04-08 (×3): 650 mg via ORAL
  Filled 2020-04-06 (×3): qty 2

## 2020-04-06 MED ORDER — MEPERIDINE HCL 50 MG/ML IJ SOLN: 6.2500 mg | INTRAMUSCULAR | Status: DC | PRN

## 2020-04-06 MED ORDER — SODIUM CHLORIDE 0.9 % IV SOLN
3.0000 g | Freq: Once | INTRAVENOUS | Status: AC
  Administered 2020-04-06: 3 g via INTRAVENOUS
  Filled 2020-04-06: qty 8

## 2020-04-06 MED ORDER — CHLORHEXIDINE GLUCONATE 4 % EX LIQD: 60.0000 mL | Freq: Once | CUTANEOUS | Status: DC

## 2020-04-06 MED ORDER — ONDANSETRON HCL 4 MG/2ML IJ SOLN: 4.0000 mg | Freq: Once | INTRAMUSCULAR | Status: DC | PRN

## 2020-04-06 MED ORDER — OXYCODONE HCL 5 MG PO TABS: 5.0000 mg | ORAL_TABLET | Freq: Once | ORAL | Status: DC | PRN

## 2020-04-06 MED ORDER — FENTANYL CITRATE (PF) 100 MCG/2ML IJ SOLN
INTRAMUSCULAR | Status: AC
  Filled 2020-04-06: qty 2

## 2020-04-06 MED ORDER — ONDANSETRON HCL 4 MG/2ML IJ SOLN
INTRAMUSCULAR | Status: AC
  Filled 2020-04-06: qty 2

## 2020-04-06 MED ORDER — LACTATED RINGERS IV SOLN: INTRAVENOUS | Status: DC

## 2020-04-06 MED ORDER — ONDANSETRON HCL 4 MG/2ML IJ SOLN: 4.0000 mg | Freq: Four times a day (QID) | INTRAMUSCULAR | Status: DC | PRN

## 2020-04-06 MED ORDER — ACETAMINOPHEN 650 MG RE SUPP
650.0000 mg | Freq: Four times a day (QID) | RECTAL | Status: DC | PRN
  Administered 2020-04-06: 23:00:00 650 mg via RECTAL
  Filled 2020-04-06: qty 1

## 2020-04-06 MED ORDER — PHENYLEPHRINE 40 MCG/ML (10ML) SYRINGE FOR IV PUSH (FOR BLOOD PRESSURE SUPPORT)
PREFILLED_SYRINGE | INTRAVENOUS | Status: DC | PRN
  Administered 2020-04-06: 80 ug via INTRAVENOUS

## 2020-04-06 MED ORDER — OXYCODONE HCL 5 MG/5ML PO SOLN: 5.0000 mg | Freq: Once | ORAL | Status: DC | PRN

## 2020-04-06 MED ORDER — BACITRACIN ZINC 500 UNIT/GM EX OINT
TOPICAL_OINTMENT | CUTANEOUS | Status: AC
  Filled 2020-04-06: qty 28.35

## 2020-04-06 MED ORDER — LIDOCAINE 2% (20 MG/ML) 5 ML SYRINGE
INTRAMUSCULAR | Status: AC
  Filled 2020-04-06: qty 5

## 2020-04-06 MED ORDER — BUPIVACAINE-EPINEPHRINE 0.25% -1:200000 IJ SOLN
INTRAMUSCULAR | Status: DC | PRN
  Administered 2020-04-06: 20 mL

## 2020-04-06 SURGICAL SUPPLY — 72 items
BAG DECANTER FOR FLEXI CONT (MISCELLANEOUS) IMPLANT
BENZOIN TINCTURE PRP APPL 2/3 (GAUZE/BANDAGES/DRESSINGS) IMPLANT
BLADE CLIPPER SURG (BLADE) IMPLANT
BLADE DERMATOME SS (BLADE) IMPLANT
BLADE HEX COATED 2.75 (ELECTRODE) IMPLANT
BLADE SURG 15 STRL LF DISP TIS (BLADE) ×1 IMPLANT
BLADE SURG 15 STRL SS (BLADE) ×3
BLADE SURG SZ10 CARB STEEL (BLADE) IMPLANT
BNDG COHESIVE 4X5 TAN STRL (GAUZE/BANDAGES/DRESSINGS) IMPLANT
BNDG ELASTIC 3X5.8 VLCR STR LF (GAUZE/BANDAGES/DRESSINGS) ×2 IMPLANT
BNDG ELASTIC 4X5.8 VLCR STR LF (GAUZE/BANDAGES/DRESSINGS) IMPLANT
BNDG ELASTIC 6X5.8 VLCR STR LF (GAUZE/BANDAGES/DRESSINGS) IMPLANT
BNDG GAUZE ELAST 4 BULKY (GAUZE/BANDAGES/DRESSINGS) ×2 IMPLANT
CLOSURE WOUND 1/2 X4 (GAUZE/BANDAGES/DRESSINGS)
COTTONBALL LRG STERILE PKG (GAUZE/BANDAGES/DRESSINGS) ×6 IMPLANT
COVER WAND RF STERILE (DRAPES) IMPLANT
DECANTER SPIKE VIAL GLASS SM (MISCELLANEOUS) IMPLANT
DERMABOND ADVANCED (GAUZE/BANDAGES/DRESSINGS)
DERMABOND ADVANCED .7 DNX12 (GAUZE/BANDAGES/DRESSINGS) IMPLANT
DERMACARRIERS GRAFT 1 TO 1.5 (DISPOSABLE)
DRAPE EXTREMITY T 121X128X90 (DISPOSABLE) IMPLANT
DRAPE IMP U-DRAPE 54X76 (DRAPES) IMPLANT
DRAPE INCISE IOBAN 66X45 STRL (DRAPES) IMPLANT
DRAPE LAPAROTOMY T 98X78 PEDS (DRAPES) IMPLANT
DRAPE SHEET LG 3/4 BI-LAMINATE (DRAPES) IMPLANT
DRAPE SURG 17X23 STRL (DRAPES) ×12 IMPLANT
DRSG ADAPTIC 3X8 NADH LF (GAUZE/BANDAGES/DRESSINGS) IMPLANT
DRSG EMULSION OIL 3X16 NADH (GAUZE/BANDAGES/DRESSINGS) ×2 IMPLANT
DRSG EMULSION OIL 3X3 NADH (GAUZE/BANDAGES/DRESSINGS) ×3 IMPLANT
DRSG PAD ABDOMINAL 8X10 ST (GAUZE/BANDAGES/DRESSINGS) IMPLANT
ELECT REM PT RETURN 15FT ADLT (MISCELLANEOUS) IMPLANT
GAUZE 4X4 16PLY RFD (DISPOSABLE) IMPLANT
GAUZE PACKING IODOFORM 1/2 (PACKING) ×4 IMPLANT
GAUZE SPONGE 4X4 12PLY STRL (GAUZE/BANDAGES/DRESSINGS) ×4 IMPLANT
GOWN STRL REUS W/ TWL LRG LVL3 (GOWN DISPOSABLE) ×1 IMPLANT
GOWN STRL REUS W/TWL LRG LVL3 (GOWN DISPOSABLE) ×3
GRAFT DERMACARRIERS 1 TO 1.5 (DISPOSABLE) IMPLANT
KIT BASIN OR (CUSTOM PROCEDURE TRAY) ×3 IMPLANT
NDL PRECISIONGLIDE 27X1.5 (NEEDLE) ×1 IMPLANT
NEEDLE PRECISIONGLIDE 27X1.5 (NEEDLE) ×3 IMPLANT
NS IRRIG 1000ML POUR BTL (IV SOLUTION) ×3 IMPLANT
PACK BASIC VI WITH GOWN DISP (CUSTOM PROCEDURE TRAY) ×3 IMPLANT
PAD CAST 3X4 CTTN HI CHSV (CAST SUPPLIES) IMPLANT
PAD CAST 4YDX4 CTTN HI CHSV (CAST SUPPLIES) IMPLANT
PADDING CAST ABS 4INX4YD NS (CAST SUPPLIES)
PADDING CAST ABS COTTON 4X4 ST (CAST SUPPLIES) IMPLANT
PADDING CAST COTTON 3X4 STRL (CAST SUPPLIES)
PADDING CAST COTTON 4X4 STRL (CAST SUPPLIES)
PENCIL SMOKE EVACUATOR (MISCELLANEOUS) IMPLANT
SPONGE LAP 18X18 RF (DISPOSABLE) ×3 IMPLANT
STAPLER VISISTAT 35W (STAPLE) IMPLANT
STOCKINETTE 4X48 STRL (DRAPES) IMPLANT
STOCKINETTE 6  STRL (DRAPES)
STOCKINETTE 6 STRL (DRAPES) IMPLANT
STOCKINETTE 8 INCH (MISCELLANEOUS) IMPLANT
STRIP CLOSURE SKIN 1/2X4 (GAUZE/BANDAGES/DRESSINGS) IMPLANT
SUCTION FRAZIER HANDLE 10FR (MISCELLANEOUS)
SUCTION TUBE FRAZIER 10FR DISP (MISCELLANEOUS) IMPLANT
SURGILUBE 2OZ TUBE FLIPTOP (MISCELLANEOUS) IMPLANT
SUT MON AB 5-0 PS2 18 (SUTURE) IMPLANT
SUT SILK 3 0 SH CR/8 (SUTURE) IMPLANT
SUT SILK 4 0 PS 2 (SUTURE) IMPLANT
SUT VIC AB 3-0 FS2 27 (SUTURE) IMPLANT
SUT VIC AB 4-0 PS2 18 (SUTURE) IMPLANT
SYR BULB EAR ULCER 3OZ GRN STR (SYRINGE) ×3 IMPLANT
SYR CONTROL 10ML LL (SYRINGE) ×3 IMPLANT
TOWEL OR 17X26 10 PK STRL BLUE (TOWEL DISPOSABLE) ×3 IMPLANT
TRAY PREP A LATEX SAFE STRL (SET/KITS/TRAYS/PACK) ×3 IMPLANT
TUBING CONNECTING 10 (TUBING) IMPLANT
TUBING CONNECTING 10' (TUBING)
UNDERPAD 30X36 HEAVY ABSORB (UNDERPADS AND DIAPERS) IMPLANT
YANKAUER SUCT BULB TIP NO VENT (SUCTIONS) IMPLANT

## 2020-04-06 NOTE — Consult Note (Signed)
Reason for Consult:Right hand infection Referring Physician: C Laurie Ward is an 86 y.o. female.  HPI: Laurie Ward was bitten by her dog on 10/11. She went to UC on 10/12 and was given a shot of Rocephin and a rx for Augmentin which she never filled. She returned today and was sent to the ED 2/2 progression of her disease. She is moderately demented and can't really contribute to history. That being said she denies fevers, chills, sweats, N/V. She is RHD.  Past Medical History:  Diagnosis Date  . Arthritis   . Asthma   . COPD (chronic obstructive pulmonary disease) (HCC)   . Former smoker   . GERD (gastroesophageal reflux disease)   . HH (hiatus hernia)   . HH (hiatus hernia)   . Hypercholesterolemia   . Hypertension   . Osteoporosis    OSTEOPENIA  . Thyroid disease    HYPOTHYROID    Past Surgical History:  Procedure Laterality Date  . ABDOMINAL HYSTERECTOMY    . CHOLECYSTECTOMY    . COLONOSCOPY  2003   Dr. Loreta Ave    No family history on file.  Social History:  reports that she has quit smoking. She has never used smokeless tobacco. She reports that she does not drink alcohol and does not use drugs.  Allergies:  Allergies  Allergen Reactions  . Levaquin [Levofloxacin In D5w]     Medications: I have reviewed the patient's current medications.  Results for orders placed or performed during the hospital encounter of 04/06/20 (from the past 48 hour(s))  CBC with Differential     Status: Abnormal   Collection Time: 04/06/20 11:04 AM  Result Value Ref Range   WBC 11.0 (H) 4.0 - 10.5 K/uL   RBC 4.17 3.87 - 5.11 MIL/uL   Hemoglobin 12.4 12.0 - 15.0 g/dL   HCT 59.5 36 - 46 %   MCV 90.2 80.0 - 100.0 fL   MCH 29.7 26.0 - 34.0 pg   MCHC 33.0 30.0 - 36.0 g/dL   RDW 63.8 75.6 - 43.3 %   Platelets 352 150 - 400 K/uL   nRBC 0.0 0.0 - 0.2 %   Neutrophils Relative % 73 %   Neutro Abs 8.0 (H) 1.7 - 7.7 K/uL   Lymphocytes Relative 16 %   Lymphs Abs 1.8 0.7 - 4.0 K/uL    Monocytes Relative 11 %   Monocytes Absolute 1.2 (H) 0.1 - 1.0 K/uL   Eosinophils Relative 0 %   Eosinophils Absolute 0.0 0.0 - 0.5 K/uL   Basophils Relative 0 %   Basophils Absolute 0.0 0.0 - 0.1 K/uL   Immature Granulocytes 0 %   Abs Immature Granulocytes 0.04 0.00 - 0.07 K/uL    Comment: Performed at [facility], [address], [place], [place] [address]    No results found.  Review of Systems  Unable to perform ROS: Dementia   Blood pressure (!) 158/73, pulse 71, temperature 98.9 F (37.2 C), temperature source Oral, resp. rate 18, SpO2 100 %. Physical Exam Constitutional:      General: She is not in acute distress.    Appearance: She is well-developed. She is not diaphoretic.  HENT:     Head: Normocephalic and atraumatic.  Eyes:     General: No scleral icterus.       Right eye: No discharge.        Left eye: No discharge.     Conjunctiva/sclera: Conjunctivae normal.  Cardiovascular:  Rate and Rhythm: Normal rate and regular rhythm.  Pulmonary:     Effort: Pulmonary effort is normal. No respiratory distress.  Musculoskeletal:     Cervical back: Normal range of motion.     Comments: Right shoulder, elbow, wrist, digits- Mod edema with fluctuance noted dorsum of hand, severe TTP, no instability, no blocks to motion  Sens  Ax/R/M/U intact  Mot   Ax/ R/ PIN/ M/ AIN/ U intact  Rad 2+  Skin:    General: Skin is warm and dry.  Neurological:     Mental Status: She is alert.  Psychiatric:        Behavior: Behavior normal.     Assessment/Plan: Right hand infection -- Plan I&D today by Dr. Arita Miss. Please keep NPO. Multiple medical problems including dementia, GERD, thyroid disease, HTN, and HLD -- Will ask medicine to admit and manage as well as to administer IV abx. Appreciate their help.    Freeman Caldron, PA-C Orthopedic Surgery [phone] 04/06/2020, 11:52 AM

## 2020-04-06 NOTE — Transfer of Care (Signed)
Immediate Anesthesia Transfer of Care Note  Patient: Laurie Ward  Procedure(s) Performed: IRRIGATION AND DEBRIDEMENT RIGHT HAND (Right )  Patient Location: PACU  Anesthesia Type:General  Level of Consciousness: awake and patient cooperative  Airway & Oxygen Therapy: Patient Spontanous Breathing and Patient connected to face mask oxygen  Post-op Assessment: Report given to RN, Post -op Vital signs reviewed and stable and Patient moving all extremities X 4  Post vital signs: stable  Last Vitals:  Vitals Value Taken Time  BP 150/56 04/06/20 1700  Temp 37.1 C 04/06/20 1700  Pulse 88 04/06/20 1707  Resp 13 04/06/20 1707  SpO2 100 % 04/06/20 1707  Vitals shown include unvalidated device data.  Last Pain:  Vitals:   04/06/20 1700  TempSrc:   PainSc: Asleep         Complications: No complications documented.

## 2020-04-06 NOTE — ED Notes (Signed)
Called report to Darl Pikes, RN on 3E

## 2020-04-06 NOTE — ED Triage Notes (Signed)
Pt has dog bite to bil hands on Tuesday. It was her dog and it is up to date with shots. Pt has dementia. She was prescribed Antibiotics and lost them prior to taking any. Rt hand red and swollen

## 2020-04-06 NOTE — Anesthesia Preprocedure Evaluation (Signed)
Anesthesia Evaluation  Patient identified by MRN, date of birth, ID band Patient awake    Reviewed: Allergy & Precautions, H&P , NPO status , Patient's Chart, lab work & pertinent test results, reviewed documented beta blocker date and time   Airway Mallampati: II  TM Distance: >3 FB Neck ROM: full    Dental no notable dental hx.    Pulmonary COPD, former smoker,    Pulmonary exam normal breath sounds clear to auscultation       Cardiovascular Exercise Tolerance: Good hypertension, Pt. on medications  Rhythm:regular Rate:Normal     Neuro/Psych Dementia negative neurological ROS     GI/Hepatic Neg liver ROS, hiatal hernia,   Endo/Other  Hypothyroidism   Renal/GU negative Renal ROS  negative genitourinary   Musculoskeletal  (+) Arthritis , Osteoarthritis,    Abdominal   Peds  Hematology negative hematology ROS (+)   Anesthesia Other Findings   Reproductive/Obstetrics negative OB ROS                             Anesthesia Physical Anesthesia Plan  ASA: III and emergent  Anesthesia Plan: General   Post-op Pain Management:    Induction: Intravenous  PONV Risk Score and Plan: 3 and Ondansetron  Airway Management Planned: LMA  Additional Equipment:   Intra-op Plan:   Post-operative Plan:   Informed Consent: I have reviewed the patients History and Physical, chart, labs and discussed the procedure including the risks, benefits and alternatives for the proposed anesthesia with the patient or authorized representative who has indicated his/her understanding and acceptance.     Dental Advisory Given  Plan Discussed with: CRNA  Anesthesia Plan Comments: ( )        Anesthesia Quick Evaluation

## 2020-04-06 NOTE — ED Notes (Signed)
Pt's husband report last PO intake was around 0600, intake consisted of 1/2 of a cinnamon bun and 1/2 a cup of coffee

## 2020-04-06 NOTE — Anesthesia Postprocedure Evaluation (Signed)
Anesthesia Post Note  Patient: Laurie Ward  Procedure(s) Performed: IRRIGATION AND DEBRIDEMENT RIGHT HAND (Right )     Patient location during evaluation: PACU Anesthesia Type: General Level of consciousness: awake and alert Pain management: pain level controlled Vital Signs Assessment: post-procedure vital signs reviewed and stable Respiratory status: spontaneous breathing, nonlabored ventilation, respiratory function stable and patient connected to nasal cannula oxygen Cardiovascular status: blood pressure returned to baseline and stable Postop Assessment: no apparent nausea or vomiting Anesthetic complications: no   No complications documented.  Last Vitals:  Vitals:   04/06/20 1447 04/06/20 1700  BP: (!) 157/69 (!) 150/56  Pulse: 79 92  Resp: 20 15  Temp: 36.5 C 37.1 C  SpO2: 97% 99%    Last Pain:  Vitals:   04/06/20 1700  TempSrc:   PainSc: Asleep                 Greyden Besecker

## 2020-04-06 NOTE — Anesthesia Procedure Notes (Signed)
Procedure Name: LMA Insertion Date/Time: 04/06/2020 4:06 PM Performed by: Illene Silver, CRNA Pre-anesthesia Checklist: Patient identified, Emergency Drugs available, Suction available and Patient being monitored Patient Re-evaluated:Patient Re-evaluated prior to induction Oxygen Delivery Method: Circle system utilized Preoxygenation: Pre-oxygenation with 100% oxygen Induction Type: IV induction Ventilation: Mask ventilation without difficulty LMA: LMA inserted LMA Size: 4.0 Tube type: Oral Number of attempts: 2 Airway Equipment and Method: Oral airway Placement Confirmation: positive ETCO2 Tube secured with: Tape Dental Injury: Teeth and Oropharynx as per pre-operative assessment  Comments: 1st attempt with Proseal , did not seat well and changed to  Regular LMA with good seating

## 2020-04-06 NOTE — ED Notes (Signed)
Short stay contacted and clarified OR timeline. Alexa, RN stated that they will come to pick pt up for surgery around 1415

## 2020-04-06 NOTE — H&P (View-Only) (Signed)
Reason for Consult:Right hand infection Referring Physician: C Sheldon  Laurie Ward is an 86 y.o. female.  HPI: Laurie Ward was bitten by her dog on 10/11. She went to UC on 10/12 and was given a shot of Rocephin and a rx for Augmentin which she never filled. She returned today and was sent to the ED 2/2 progression of her disease. She is moderately demented and can't really contribute to history. That being said she denies fevers, chills, sweats, N/V. She is RHD.  Past Medical History:  Diagnosis Date  . Arthritis   . Asthma   . COPD (chronic obstructive pulmonary disease) (HCC)   . Former smoker   . GERD (gastroesophageal reflux disease)   . HH (hiatus hernia)   . HH (hiatus hernia)   . Hypercholesterolemia   . Hypertension   . Osteoporosis    OSTEOPENIA  . Thyroid disease    HYPOTHYROID    Past Surgical History:  Procedure Laterality Date  . ABDOMINAL HYSTERECTOMY    . CHOLECYSTECTOMY    . COLONOSCOPY  2003   Dr. Mann    No family history on file.  Social History:  reports that she has quit smoking. She has never used smokeless tobacco. She reports that she does not drink alcohol and does not use drugs.  Allergies:  Allergies  Allergen Reactions  . Levaquin [Levofloxacin In D5w]     Medications: I have reviewed the patient's current medications.  Results for orders placed or performed during the hospital encounter of 04/06/20 (from the past 48 hour(s))  CBC with Differential     Status: Abnormal   Collection Time: 04/06/20 11:04 AM  Result Value Ref Range   WBC 11.0 (H) 4.0 - 10.5 K/uL   RBC 4.17 3.87 - 5.11 MIL/uL   Hemoglobin 12.4 12.0 - 15.0 g/dL   HCT 37.6 36 - 46 %   MCV 90.2 80.0 - 100.0 fL   MCH 29.7 26.0 - 34.0 pg   MCHC 33.0 30.0 - 36.0 g/dL   RDW 13.9 11.5 - 15.5 %   Platelets 352 150 - 400 K/uL   nRBC 0.0 0.0 - 0.2 %   Neutrophils Relative % 73 %   Neutro Abs 8.0 (H) 1.7 - 7.7 K/uL   Lymphocytes Relative 16 %   Lymphs Abs 1.8 0.7 - 4.0 K/uL    Monocytes Relative 11 %   Monocytes Absolute 1.2 (H) 0.1 - 1.0 K/uL   Eosinophils Relative 0 %   Eosinophils Absolute 0.0 0.0 - 0.5 K/uL   Basophils Relative 0 %   Basophils Absolute 0.0 0.0 - 0.1 K/uL   Immature Granulocytes 0 %   Abs Immature Granulocytes 0.04 0.00 - 0.07 K/uL    Comment: Performed at [facility], [address] [place], [place] [address]    No results found.  Review of Systems  Unable to perform ROS: Dementia   Blood pressure (!) 158/73, pulse 71, temperature 98.9 F (37.2 C), temperature source Oral, resp. rate 18, SpO2 100 %. Physical Exam Constitutional:      General: She is not in acute distress.    Appearance: She is well-developed. She is not diaphoretic.  HENT:     Head: Normocephalic and atraumatic.  Eyes:     General: No scleral icterus.       Right eye: No discharge.        Left eye: No discharge.     Conjunctiva/sclera: Conjunctivae normal.  Cardiovascular:       Rate and Rhythm: Normal rate and regular rhythm.  Pulmonary:     Effort: Pulmonary effort is normal. No respiratory distress.  Musculoskeletal:     Cervical back: Normal range of motion.     Comments: Right shoulder, elbow, wrist, digits- Mod edema with fluctuance noted dorsum of hand, severe TTP, no instability, no blocks to motion  Sens  Ax/R/M/U intact  Mot   Ax/ R/ PIN/ M/ AIN/ U intact  Rad 2+  Skin:    General: Skin is warm and dry.  Neurological:     Mental Status: She is alert.  Psychiatric:        Behavior: Behavior normal.     Assessment/Plan: Right hand infection -- Plan I&D today by Dr. Arita Miss. Please keep NPO. Multiple medical problems including dementia, GERD, thyroid disease, HTN, and HLD -- Will ask medicine to admit and manage as well as to administer IV abx. Appreciate their help.    Freeman Caldron, PA-C Orthopedic Surgery [phone] 04/06/2020, 11:52 AM

## 2020-04-06 NOTE — Progress Notes (Signed)
Patient underwent Irrigation and Debridement of right hand infection today with Dr. Arita Miss. Patient tolerated procedure well.  Do daily dressing changes. 1) Remove dressing and soak hand in saline as long as tolerated (10 - 15 mins if possible) 2) Pack wound with Iodoform packing gauze 3) cover with 4x4 gauze 4) Wrap with Kerlix and ACE Wrap

## 2020-04-06 NOTE — Op Note (Signed)
Operative Note   DATE OF OPERATION: 04/06/2020  SURGICAL DEPARTMENT: Plastic Surgery  PREOPERATIVE DIAGNOSES: Right hand infection  POSTOPERATIVE DIAGNOSES:  same  PROCEDURE: Incision and drainage right hand  SURGEON: Ancil Linsey, MD  ASSISTANT: Joni Fears, PA The advanced practice practitioner (APP) assisted throughout the case.  The APP was essential in retraction and counter traction when needed to make the case progress smoothly.  This retraction and assistance made it possible to see the tissue plans for the procedure.  The assistance was needed for blood control, tissue re-approximation and assisted with closure of the incision site.  ANESTHESIA:  General.   COMPLICATIONS: None.   INDICATIONS FOR PROCEDURE:  The patient, Laurie Ward is a 86 y.o. female born on January 12, 1927, is here for treatment of dorsal right hand infection after dog bite MRN: [id number]  CONSENT:  Informed consent was obtained directly from the patient. Risks, benefits and alternatives were fully discussed. Specific risks including but not limited to bleeding, infection, hematoma, seroma, scarring, pain, contracture, asymmetry, wound healing problems, and need for further surgery were all discussed. The patient did have an ample opportunity to have questions answered to satisfaction.   DESCRIPTION OF PROCEDURE:  The patient was taken to the operating room. SCDs were placed and antibiotics were given.  General anesthesia was administered.  The patient's operative site was prepped and draped in a sterile fashion. A time out was performed and all information was confirmed to be correct.  I started by using the mini C arm to take an x-ray.  I did not see one in our system and just wanted to make sure that there was no fractures.  AP and lateral views were taken of the hand and wrist and no obvious fracture was identified although the quality was not as good as the typical plain film.  I then proceeded to  perform the incision and drainage.  It looks like there was a prominent area over the dorsal aspect of the first webspace.  I incised along this with a 15 blade and evacuated some purulence.  This was cultured with a culture swab.  Hemostat was then used to access the deep hand spaces around this area both dorsally and towards the volar aspect of the first webspace and no further purulence was encountered.  Copious saline irrigation was then administered followed by meticulous hemostasis.  Iodoform gauze was used to pack the wound that was wrapped with a soft dressing.  The patient tolerated the procedure well.  There were no complications. The patient was allowed to wake from anesthesia, extubated and taken to the recovery room in satisfactory condition.

## 2020-04-06 NOTE — ED Notes (Signed)
Pt oriented to self only 

## 2020-04-06 NOTE — Interval H&P Note (Signed)
History and Physical Interval Note:  04/06/2020 3:58 PM  Laurie Ward  has presented today for surgery, with the diagnosis of Right hand infection.  The various methods of treatment have been discussed with the patient and family. After consideration of risks, benefits and other options for treatment, the patient has consented to  Procedure(s): IRRIGATION AND DEBRIDEMENT RIGHT HAND (Right) as a surgical intervention.  The patient's history has been reviewed, patient examined, no change in status, stable for surgery.  I have reviewed the patient's chart and labs.  Questions were answered to the patient's satisfaction.     Allena Napoleon

## 2020-04-06 NOTE — ED Notes (Signed)
Husband signed consent form

## 2020-04-06 NOTE — Brief Op Note (Signed)
04/06/2020  4:46 PM  PATIENT:  Laurie Ward  86 y.o. female  PRE-OPERATIVE DIAGNOSIS:  Right hand infection  POST-OPERATIVE DIAGNOSIS:  Right hand infection  PROCEDURE:  Procedure(s): IRRIGATION AND DEBRIDEMENT RIGHT HAND (Right)  SURGEON:  Surgeon(s) and Role:    * Sohrab Keelan, Wendy Poet, MD - Primary  PHYSICIAN ASSISTANT: Joni Fears, PA  ASSISTANTS: none   ANESTHESIA:   general  EBL:  100 mL   BLOOD ADMINISTERED:none  DRAINS: none   LOCAL MEDICATIONS USED:  MARCAINE     SPECIMEN:  Source of Specimen:  right hand abscess  DISPOSITION OF SPECIMEN:  PATHOLOGY  COUNTS:  YES  TOURNIQUET:  * No tourniquets in log *  DICTATION: .Dragon Dictation  PLAN OF CARE: Admit to inpatient   PATIENT DISPOSITION:  PACU - hemodynamically stable.   Delay start of Pharmacological VTE agent (>24hrs) due to surgical blood loss or risk of bleeding: not applicable

## 2020-04-06 NOTE — ED Provider Notes (Signed)
[facility] EMERGENCY DEPARTMENT Provider Note  CSN: [id number] Arrival date & time: 04/06/20 1000    History Chief Complaint  Patient presents with  . Animal Bite  . Dementia    HPI  Laurie Ward is a 86 y.o. female with history of dementia, brought to the ED from [facility] in [place]. She sustained several bites on her bilateral hands/wrists from her personal dog on 10/11, seen at [facility] on 10/12, given Rocephin IM and prescribed Augmentin but the patient and husband lost the pills after getting them filled. She was back at [facility] today and noted to have significantly worsening swelling and so sent to the ED for evaluation. Denies fever.    Past Medical History:  Diagnosis Date  . Arthritis   . Asthma   . COPD (chronic obstructive pulmonary disease) (HCC)   . Former smoker   . GERD (gastroesophageal reflux disease)   . HH (hiatus hernia)   . HH (hiatus hernia)   . Hypercholesterolemia   . Hypertension   . Osteoporosis    OSTEOPENIA  . Thyroid disease    HYPOTHYROID    Past Surgical History:  Procedure Laterality Date  . ABDOMINAL HYSTERECTOMY    . CHOLECYSTECTOMY    . COLONOSCOPY  2003   Dr. Loreta Ave    History reviewed. No pertinent family history.  Social History   Tobacco Use  . Smoking status: Former Games developer  . Smokeless tobacco: Never Used  Substance Use Topics  . Alcohol use: No  . Drug use: No     Home Medications Prior to Admission medications   Medication Sig Start Date End Date Taking? Authorizing Provider  acetaminophen (TYLENOL) 500 MG tablet Take 500 mg by mouth every 6 (six) hours as needed.    [provider]  donepezil (ARICEPT) 10 MG tablet Take 1 tablet (10 mg total) by mouth at bedtime. 12/14/18   Ronnald Nian, MD  donepezil (ARICEPT) 5 MG tablet TAKE 1 TABLET BY MOUTH ONCE DAILY AT BEDTIME 10/08/19   Ronnald Nian, MD  esomeprazole (NEXIUM) 40 MG capsule Take 1 capsule (40 mg total) by mouth daily at 12 noon. 11/30/18    Ronnald Nian, MD  EUTHYROX 75 MCG tablet Take 1 tablet by mouth once daily 07/27/19   Ronnald Nian, MD  FLUZONE HIGH-DOSE QUADRIVALENT 0.7 ML SUSY  02/17/19   [provider]  lisinopril-hydrochlorothiazide (ZESTORETIC) 20-12.5 MG tablet Take 1 tablet by mouth daily. 12/01/19   Ronnald Nian, MD  polyethylene glycol powder (GLYCOLAX/MIRALAX) powder Take 17 g by mouth daily. 02/02/16   Ronnald Nian, MD  prednisoLONE acetate (PRED FORTE) 1 % ophthalmic suspension  06/02/19   [provider]  simvastatin (ZOCOR) 40 MG tablet TAKE 1 TABLET BY MOUTH ONCE DAILY IN THE MORNING 11/15/19   Ronnald Nian, MD  traZODone (DESYREL) 50 MG tablet TAKE 1 TABLET BY MOUTH EVERY DAY AT BEDTIME AS NEEDED 08/24/18   Ronnald Nian, MD     Allergies    Levaquin [levofloxacin in d5w]   Review of Systems   Review of Systems A comprehensive review of systems was completed and negative except as noted in HPI.    Physical Exam BP (!) 157/69   Pulse 79   Temp 97.7 F (36.5 C) (Oral)   Resp 20   Ht 5\' 1"  (1.549 m)   Wt 57 kg   SpO2 97%   BMI 23.74 kg/m   Physical Exam Vitals  and nursing note reviewed.  Constitutional:      Appearance: Normal appearance.  HENT:     Head: Normocephalic and atraumatic.     Nose: Nose normal.     Mouth/Throat:     Mouth: Mucous membranes are moist.  Eyes:     Extraocular Movements: Extraocular movements intact.     Conjunctiva/sclera: Conjunctivae normal.  Cardiovascular:     Rate and Rhythm: Normal rate.  Pulmonary:     Effort: Pulmonary effort is normal.     Breath sounds: Normal breath sounds.  Abdominal:     General: Abdomen is flat.     Palpations: Abdomen is soft.     Tenderness: There is no abdominal tenderness.  Musculoskeletal:        General: No swelling. Normal range of motion.     Cervical back: Neck supple.  Skin:    General: Skin is warm and dry.     Comments: Multiple superficial puncture wounds to both hands/wrists;  there is moderate to severe swelling and erythema to R hand.   Neurological:     General: No focal deficit present.     Mental Status: She is alert.  Psychiatric:        Mood and Affect: Mood normal.        ED Results / Procedures / Treatments   Labs (all labs ordered are listed, but only abnormal results are displayed) Labs Reviewed  BASIC METABOLIC PANEL - Abnormal; Notable for the following components:      Result Value   Potassium 3.4 (*)    Glucose, Bld 110 (*)    BUN 29 (*)    Creatinine, Ser 1.53 (*)    GFR, Estimated 29 (*)    All other components within normal limits  CBC WITH DIFFERENTIAL/PLATELET - Abnormal; Notable for the following components:   WBC 11.0 (*)    Neutro Abs 8.0 (*)    Monocytes Absolute 1.2 (*)    All other components within normal limits  CULTURE, BLOOD (ROUTINE X 2)  CULTURE, BLOOD (ROUTINE X 2)  RESPIRATORY PANEL BY RT PCR (FLU A&B, COVID)    EKG None  Radiology No results found.  Procedures Procedures  Medications Ordered in the ED Medications  chlorhexidine (HIBICLENS) 4 % liquid 4 application (has no administration in time range)  lactated ringers infusion ( Intravenous New Bag/Given 04/06/20 1456)  Ampicillin-Sulbactam (UNASYN) 3 g in sodium chloride 0.9 % 100 mL IVPB (0 g Intravenous Stopped 04/06/20 1216)  Tdap (BOOSTRIX) injection 0.5 mL (0.5 mLs Intramuscular Given 04/06/20 1107)  povidone-iodine 10 % swab 2 application (2 application Topical Given 04/06/20 1456)     MDM Rules/Calculators/A&P MDM Patient with progressive infection of R hand since dog bite earlier this week. Will check labs, give Unasyn and discuss with Hand.  ED Course  I have reviewed the triage vital signs and the nursing notes.  Pertinent labs & imaging results that were available during my care of the patient were reviewed by me and considered in my medical decision making (see chart for details).  Clinical Course as of Apr 06 1512  Thu Apr 06, 2020  1015 Spoke with Lin Givens, [facility] with Ortho/Hand who will evaluate the patient to determine whether she may need OR washout.    [CS]  1155 CBC with mild leukocytosis.    [CS]  1213 Patient seen by Hand, plan for OR later today, requests Medicine admission for management. Hospitalist paged.    [CS]  1221 Spoke  with Dr. Ronaldo Miyamoto, Hospitalist, who will evaluate for admission.    [CS]    Clinical Course User Index [CS] Pollyann Savoy, MD    Final Clinical Impression(s) / ED Diagnoses Final diagnoses:  Dog bite of right hand, initial encounter  Cellulitis of right hand    Rx / DC Orders ED Discharge Orders    None       Pollyann Savoy, MD 04/06/20 1513

## 2020-04-06 NOTE — H&P (Signed)
History and Physical    ALYXANDRA TENBRINK KKX:[id number] DOB: 05-20-1927 DOA: 04/06/2020  PCP: Ronnald Nian, MD  Patient coming from: Home  Chief Complaint: Right hand pain after dog bite.  HPI: Laurie Ward is a 86 y.o. female with medical history significant of dementia. Difficult history to obtain as patient is severely demented and husband is very hard of hearing. Apparently, Mrs Losey had a dog bite 2 days ago. She went to Urgent Care to get checked out. She was given a shot of penicillin and then oral abx to take home. Her husband states he does not know where the abx are. Her hand has remained painful and continued to swell. They became concerned again this morning and went back to Urgent Care. They were instructed to go to the ED from there.   ED Course: EDP spoke with ortho. She will be taken to OR today for I&D. TRH was called for admission.   Review of Systems:  Denies F, N/V/D, CP, dyspnea. Review of systems is otherwise negative for all not mentioned in HPI.   PMHx Past Medical History:  Diagnosis Date  . Arthritis   . Asthma   . COPD (chronic obstructive pulmonary disease) (HCC)   . Former smoker   . GERD (gastroesophageal reflux disease)   . HH (hiatus hernia)   . HH (hiatus hernia)   . Hypercholesterolemia   . Hypertension   . Osteoporosis    OSTEOPENIA  . Thyroid disease    HYPOTHYROID    PSHx Past Surgical History:  Procedure Laterality Date  . ABDOMINAL HYSTERECTOMY    . CHOLECYSTECTOMY    . COLONOSCOPY  2003   Dr. Loreta Ave    SocHx  reports that she has quit smoking. She has never used smokeless tobacco. She reports that she does not drink alcohol and does not use drugs.  Allergies  Allergen Reactions  . Levaquin [Levofloxacin In D5w]     FamHx No family history on file.  Prior to Admission medications   Medication Sig Start Date End Date Taking? Authorizing Provider  acetaminophen (TYLENOL) 500 MG tablet Take 500 mg by mouth every 6 (six)  hours as needed.    [provider]  donepezil (ARICEPT) 10 MG tablet Take 1 tablet (10 mg total) by mouth at bedtime. 12/14/18   Ronnald Nian, MD  donepezil (ARICEPT) 5 MG tablet TAKE 1 TABLET BY MOUTH ONCE DAILY AT BEDTIME 10/08/19   Ronnald Nian, MD  esomeprazole (NEXIUM) 40 MG capsule Take 1 capsule (40 mg total) by mouth daily at 12 noon. 11/30/18   Ronnald Nian, MD  EUTHYROX 75 MCG tablet Take 1 tablet by mouth once daily 07/27/19   Ronnald Nian, MD  FLUZONE HIGH-DOSE QUADRIVALENT 0.7 ML SUSY  02/17/19   [provider]  lisinopril-hydrochlorothiazide (ZESTORETIC) 20-12.5 MG tablet Take 1 tablet by mouth daily. 12/01/19   Ronnald Nian, MD  polyethylene glycol powder (GLYCOLAX/MIRALAX) powder Take 17 g by mouth daily. 02/02/16   Ronnald Nian, MD  prednisoLONE acetate (PRED FORTE) 1 % ophthalmic suspension  06/02/19   [provider]  simvastatin (ZOCOR) 40 MG tablet TAKE 1 TABLET BY MOUTH ONCE DAILY IN THE MORNING 11/15/19   Ronnald Nian, MD  traZODone (DESYREL) 50 MG tablet TAKE 1 TABLET BY MOUTH EVERY DAY AT BEDTIME AS NEEDED 08/24/18   Ronnald Nian, MD    Physical Exam: Vitals:   04/06/20 1010 04/06/20 1030 04/06/20 1130 04/06/20  1200  BP: (!) 151/78 (!) 158/73 (!) 182/84 (!) 162/85  Pulse: 69 71 80 80  Resp:  18 14 17   Temp: 98.9 F (37.2 C)     TempSrc: Oral     SpO2: 98% 100% 100% 100%    General: 86 y.o. female resting in bed in NAD Eyes: PERRL, normal sclera ENMT: Nares patent w/o discharge, orophaynx clear, dentition normal, ears w/o discharge/lesions/ulcers Neck: Supple, trachea midline Cardiovascular: RRR, +S1, S2, no m/g/r, equal pulses throughout Respiratory: CTABL, no w/r/r, normal WOB GI: BS+, NDNT, no masses noted, no organomegaly noted MSK: No e/c/c; right hand swelling/erythema, unable to fully flose Skin: No rashes, bruises, ulcerations noted Neuro: A&O x 1 (name only), no focal deficits Psyc: calm/cooperative, pleasantly  demented  Labs on Admission: I have personally reviewed following labs and imaging studies  CBC: Recent Labs  Lab 04/06/20 1104  WBC 11.0*  NEUTROABS 8.0*  HGB 12.4  HCT 37.6  MCV 90.2  PLT 352   Basic Metabolic Panel: Recent Labs  Lab 04/06/20 1104  NA 137  K 3.4*  CL 101  CO2 22  GLUCOSE 110*  BUN 29*  CREATININE 1.53*  CALCIUM 9.0   GFR: CrCl cannot be calculated (Unknown ideal weight.). Liver Function Tests: No results for input(s): AST, ALT, ALKPHOS, BILITOT, PROT, ALBUMIN in the last 168 hours. No results for input(s): LIPASE, AMYLASE in the last 168 hours. No results for input(s): AMMONIA in the last 168 hours. Coagulation Profile: No results for input(s): INR, PROTIME in the last 168 hours. Cardiac Enzymes: No results for input(s): CKTOTAL, CKMB, CKMBINDEX, TROPONINI in the last 168 hours. BNP (last 3 results) No results for input(s): PROBNP in the last 8760 hours. HbA1C: No results for input(s): HGBA1C in the last 72 hours. CBG: No results for input(s): GLUCAP in the last 168 hours. Lipid Profile: No results for input(s): CHOL, HDL, LDLCALC, TRIG, CHOLHDL, LDLDIRECT in the last 72 hours. Thyroid Function Tests: No results for input(s): TSH, T4TOTAL, FREET4, T3FREE, THYROIDAB in the last 72 hours. Anemia Panel: No results for input(s): VITAMINB12, FOLATE, FERRITIN, TIBC, IRON, RETICCTPCT in the last 72 hours. Urine analysis:    Component Value Date/Time   COLORURINE YELLOW 10/16/2012 0120   APPEARANCEUR CLEAR 10/16/2012 0120   LABSPEC 1.019 10/16/2012 0120   PHURINE 5.5 10/16/2012 0120   GLUCOSEU NEGATIVE 10/16/2012 0120   HGBUR NEGATIVE 10/16/2012 0120   BILIRUBINUR n 11/22/2016 1132   KETONESUR NEGATIVE 10/16/2012 0120   PROTEINUR n 11/22/2016 1132   PROTEINUR NEGATIVE 10/16/2012 0120   UROBILINOGEN 0.2 11/22/2016 1132   UROBILINOGEN 0.2 10/16/2012 0120   NITRITE n 11/22/2016 1132   NITRITE NEGATIVE 10/16/2012 0120   LEUKOCYTESUR Negative  11/22/2016 1132    Radiological Exams on Admission: No results found.  Assessment/Plan Cellulitis Right Hand secondary to dog bite     - admit to obs, med-surg     - unasyn 3g q12 (confirmed with pharmacy)     - ortho to take today for I&D     - PRN pain control  Alzheimer's dementia     - continue home regimen     - she will need a sitter  HTN     - PRN BP meds     - hold home HCTZ/ACEi d/t AKI  AKI on CKD3b?     - not a lot of datapoints to go on for her baseline function     - Scr is 1.53 today; hold BP meds, watch  nephrotoxins     - give small amount of fluid.   Hypokalemia     - check Mg2+     - replace, follow  Hypothyroidism     - continue home levothyroxine  DVT prophylaxis: heparin  Code Status: FULL  Family Communication: with husband at bedside  Consults called: EDP talked with ortho  Admission status: Observation  Status is: Observation  The patient remains OBS appropriate and will d/c before 2 midnights.  Dispo: The patient is from: Home              Anticipated d/c is to: Home              Anticipated d/c date is: 1 day              Patient currently is not medically stable to d/c.  Teddy Spike DO [facility]  If 7PM-7AM, please contact night-coverage [email or web address]  04/06/2020, 12:36 PM

## 2020-04-07 ENCOUNTER — Encounter (HOSPITAL_COMMUNITY): Payer: Self-pay | Admitting: Plastic Surgery

## 2020-04-07 DIAGNOSIS — N179 Acute kidney failure, unspecified: Secondary | ICD-10-CM | POA: Diagnosis not present

## 2020-04-07 DIAGNOSIS — L089 Local infection of the skin and subcutaneous tissue, unspecified: Secondary | ICD-10-CM

## 2020-04-07 DIAGNOSIS — K219 Gastro-esophageal reflux disease without esophagitis: Secondary | ICD-10-CM | POA: Diagnosis present

## 2020-04-07 DIAGNOSIS — Z79899 Other long term (current) drug therapy: Secondary | ICD-10-CM | POA: Diagnosis not present

## 2020-04-07 DIAGNOSIS — M1990 Unspecified osteoarthritis, unspecified site: Secondary | ICD-10-CM | POA: Diagnosis present

## 2020-04-07 DIAGNOSIS — S61431A Puncture wound without foreign body of right hand, initial encounter: Secondary | ICD-10-CM | POA: Diagnosis present

## 2020-04-07 DIAGNOSIS — S61451A Open bite of right hand, initial encounter: Secondary | ICD-10-CM | POA: Diagnosis not present

## 2020-04-07 DIAGNOSIS — E7800 Pure hypercholesterolemia, unspecified: Secondary | ICD-10-CM | POA: Diagnosis present

## 2020-04-07 DIAGNOSIS — E785 Hyperlipidemia, unspecified: Secondary | ICD-10-CM | POA: Diagnosis present

## 2020-04-07 DIAGNOSIS — E039 Hypothyroidism, unspecified: Secondary | ICD-10-CM | POA: Diagnosis present

## 2020-04-07 DIAGNOSIS — G309 Alzheimer's disease, unspecified: Secondary | ICD-10-CM | POA: Diagnosis not present

## 2020-04-07 DIAGNOSIS — I129 Hypertensive chronic kidney disease with stage 1 through stage 4 chronic kidney disease, or unspecified chronic kidney disease: Secondary | ICD-10-CM | POA: Diagnosis present

## 2020-04-07 DIAGNOSIS — Z23 Encounter for immunization: Secondary | ICD-10-CM | POA: Diagnosis not present

## 2020-04-07 DIAGNOSIS — F0280 Dementia in other diseases classified elsewhere without behavioral disturbance: Secondary | ICD-10-CM

## 2020-04-07 DIAGNOSIS — L0390 Cellulitis, unspecified: Secondary | ICD-10-CM | POA: Diagnosis present

## 2020-04-07 DIAGNOSIS — J449 Chronic obstructive pulmonary disease, unspecified: Secondary | ICD-10-CM | POA: Diagnosis present

## 2020-04-07 DIAGNOSIS — M810 Age-related osteoporosis without current pathological fracture: Secondary | ICD-10-CM | POA: Diagnosis present

## 2020-04-07 DIAGNOSIS — Z79890 Hormone replacement therapy (postmenopausal): Secondary | ICD-10-CM | POA: Diagnosis not present

## 2020-04-07 DIAGNOSIS — Z881 Allergy status to other antibiotic agents status: Secondary | ICD-10-CM | POA: Diagnosis not present

## 2020-04-07 DIAGNOSIS — N1832 Chronic kidney disease, stage 3b: Secondary | ICD-10-CM | POA: Diagnosis present

## 2020-04-07 DIAGNOSIS — Z659 Problem related to unspecified psychosocial circumstances: Secondary | ICD-10-CM | POA: Diagnosis not present

## 2020-04-07 DIAGNOSIS — L03113 Cellulitis of right upper limb: Secondary | ICD-10-CM | POA: Diagnosis present

## 2020-04-07 DIAGNOSIS — Z87891 Personal history of nicotine dependence: Secondary | ICD-10-CM | POA: Diagnosis not present

## 2020-04-07 DIAGNOSIS — E876 Hypokalemia: Secondary | ICD-10-CM | POA: Diagnosis present

## 2020-04-07 DIAGNOSIS — L03818 Cellulitis of other sites: Secondary | ICD-10-CM | POA: Diagnosis not present

## 2020-04-07 DIAGNOSIS — K449 Diaphragmatic hernia without obstruction or gangrene: Secondary | ICD-10-CM | POA: Diagnosis present

## 2020-04-07 DIAGNOSIS — W540XXA Bitten by dog, initial encounter: Secondary | ICD-10-CM | POA: Diagnosis not present

## 2020-04-07 DIAGNOSIS — Z20822 Contact with and (suspected) exposure to covid-19: Secondary | ICD-10-CM | POA: Diagnosis present

## 2020-04-07 LAB — CBC
HCT: 31.9 % — ABNORMAL LOW (ref 36.0–46.0)
Hemoglobin: 10.7 g/dL — ABNORMAL LOW (ref 12.0–15.0)
MCH: 29.8 pg (ref 26.0–34.0)
MCHC: 33.5 g/dL (ref 30.0–36.0)
MCV: 88.9 fL (ref 80.0–100.0)
Platelets: 339 10*3/uL (ref 150–400)
RBC: 3.59 MIL/uL — ABNORMAL LOW (ref 3.87–5.11)
RDW: 13.7 % (ref 11.5–15.5)
WBC: 9 10*3/uL (ref 4.0–10.5)
nRBC: 0 % (ref 0.0–0.2)

## 2020-04-07 LAB — COMPREHENSIVE METABOLIC PANEL
ALT: 16 U/L (ref 0–44)
AST: 20 U/L (ref 15–41)
Albumin: 3.1 g/dL — ABNORMAL LOW (ref 3.5–5.0)
Alkaline Phosphatase: 52 U/L (ref 38–126)
Anion gap: 12 (ref 5–15)
BUN: 20 mg/dL (ref 8–23)
CO2: 21 mmol/L — ABNORMAL LOW (ref 22–32)
Calcium: 8.3 mg/dL — ABNORMAL LOW (ref 8.9–10.3)
Chloride: 106 mmol/L (ref 98–111)
Creatinine, Ser: 1.07 mg/dL — ABNORMAL HIGH (ref 0.44–1.00)
GFR, Estimated: 45 mL/min — ABNORMAL LOW (ref >60)
Glucose, Bld: 117 mg/dL — ABNORMAL HIGH (ref 70–99)
Potassium: 3.7 mmol/L (ref 3.5–5.1)
Sodium: 139 mmol/L (ref 135–145)
Total Bilirubin: 1 mg/dL (ref 0.3–1.2)
Total Protein: 6.8 g/dL (ref 6.5–8.1)

## 2020-04-07 NOTE — Assessment & Plan Note (Signed)
-  Patient on brand-name Euthyrox 75 mcg daily - okay to hold while in hospital unless prolonged stay; resume at discharge

## 2020-04-07 NOTE — Assessment & Plan Note (Addendum)
-   see bite wound as well - Unasyn changed to Augmentin at d/c  - continue pain control

## 2020-04-07 NOTE — Assessment & Plan Note (Addendum)
-   continue home regimen - discharged home in care of husband

## 2020-04-07 NOTE — Hospital Course (Addendum)
Laurie Ward is a 86 y.o. female with PMH severe Alz dementia who presented with a right hand wound from a dog bite. She was initially treated with PCN shot then oral abx at home, however the abx were not taken for unknown reasons (lost, forgotten/dementia). She returned to the hospital with worsening pain and swelling.  She was taken to the OR on 10/14 for I&D. She was treated with Unasyn on admission. OR would cultures have remained negative.  For care at home after discharge she was arranged for [facility] and H[facility].  She was discharge with Augmentin to complete course.

## 2020-04-07 NOTE — Progress Notes (Signed)
PROGRESS NOTE    Laurie Ward   ZOX:[id number]  DOB: 04-09-1927  DOA: 04/06/2020     0  PCP: Ronnald Nian, MD  CC: hand wound  Hospital Course: Laurie Ward is a 86 y.o. female with PMH severe Alz dementia who presented with a right hand wound from a dog bite. She was initially treated with PCN shot then oral abx at home, however the abx were not taken for unknown reasons (lost, forgotten/dementia). She returned to the hospital with worsening pain and swelling.  She was taken to the OR on 10/14 for I&D. She was treated with Unasyn on admission. OR would cultures have remained negative.    Interval History:  Seen this morning resting in bed in her typical demented state.  Arm wrapped in bandage and swollen fingertips noted.  She could not answer any questions appropriately due to her dementia.  Did have some pain when her surgical site was manipulated otherwise seemed to be resting comfortably.  Old records reviewed in assessment of this patient  ROS: Review of systems not obtained due to patient factors.  Severe dementia  Assessment & Plan: * Cellulitis - see bit wound as well - currently on Unasyn; follow up cultures - continue pain control - will discuss with family and SW regarding home situation as patient failed to take oral abx at home and she has severe dementia (with concern about husband able to properly care for her as well)  Bite wound of right hand with infection - TDAP given on 10/14 - s/p I&D on 10/14 with surgery - dressing changes per surgery: Remove dressing and soak hand in saline as long as tolerated (10 - 15 mins if possible). Pack wound with Iodoform packing gauze. Cover with 4x4 gauze. Wrap with Kerlix and ACE Wrap - continue Unasyn for now; follow up cultures; likely d/c on Augmentin   Hypokalemia -Replete and recheck as needed  AKI (acute kidney injury) (HCC) -Resolving with fluids  Alzheimer's dementia (HCC) - continue home regimen -  will still need sitter for safety; trying to get OOB  Hypothyroid -Patient on brand-name Euthyrox 75 mcg daily - okay to hold while in hospital unless prolonged stay; resume at discharge    Antimicrobials: Unasyn 04/06/2020>> present  DVT prophylaxis: HSQ Code Status: Full Family Communication: None present Disposition Plan: Status is: Observation  The patient remains OBS appropriate and will d/c before 2 midnights.  Dispo: The patient is from: Home              Anticipated d/c is to: Home              Anticipated d/c date is: 1 day              Patient currently is not medically stable to d/c.  Objective: Blood pressure (!) 153/50, pulse 76, temperature 98.1 F (36.7 C), temperature source Oral, resp. rate 15, height 5\' 1"  (1.549 m), weight 57 kg, SpO2 100 %.  Examination: General appearance: severely demented, unable to answer questions or follow commands; no distress and resting in bed Head: Normocephalic, without obvious abnormality, atraumatic Eyes: EOMI Lungs: clear to auscultation bilaterally Heart: regular rate and rhythm and S1, S2 normal Abdomen: normal findings: bowel sounds normal and soft, non-tender Extremities: right arm wrapped in bandage; edematous fingertips and warm/well perfused. TTP at surgical site as expected Skin: mobility and turgor normal Neurologic: Moves all 4 extremities.  Unable to follow any commands due to underlying severe dementia  Consultants:   Plastic surgery  Procedures:   R hand I&D: 04/06/20  Data Reviewed: I have personally reviewed following labs and imaging studies Results for orders placed or performed during the hospital encounter of 04/06/20 (from the past 24 hour(s))  Aerobic/Anaerobic Culture (surgical/deep wound)     Status: None (Preliminary result)   Collection Time: 04/06/20  4:21 PM   Specimen: Other Source; Body Fluid  Result Value Ref Range   Specimen Description      ABSCESS RIGHT HAND Performed at [facility], [address], [place], [place] [address]    Special Requests      NONE Performed at [facility], [address], [place], [place] [address]    Gram Stain      RARE WBC PRESENT, PREDOMINANTLY PMN NO ORGANISMS SEEN    Culture      NO GROWTH < 12 HOURS Performed at [facility], [address], [place], [place] [address]    Report Status PENDING   Comprehensive metabolic panel     Status: Abnormal   Collection Time: 04/07/20  5:25 AM  Result Value Ref Range   Sodium 139 135 - 145 mmol/L   Potassium 3.7 3.5 - 5.1 mmol/L   Chloride 106 98 - 111 mmol/L   CO2 21 (L) 22 - 32 mmol/L   Glucose, Bld 117 (H) 70 - 99 mg/dL   BUN 20 8 - 23 mg/dL   Creatinine, Ser 0.92 (H) 0.44 - 1.00 mg/dL   Calcium 8.3 (L) 8.9 - 10.3 mg/dL   Total Protein 6.8 6.5 - 8.1 g/dL   Albumin 3.1 (L) 3.5 - 5.0 g/dL   AST 20 15 - 41 U/L   ALT 16 0 - 44 U/L   Alkaline Phosphatase 52 38 - 126 U/L   Total Bilirubin 1.0 0.3 - 1.2 mg/dL   GFR, Estimated 45 (L) >60 mL/min   Anion gap 12 5 - 15  CBC     Status: Abnormal   Collection Time: 04/07/20  5:25 AM  Result Value Ref Range   WBC 9.0 4.0 - 10.5 K/uL   RBC 3.59 (L) 3.87 - 5.11 MIL/uL   Hemoglobin 10.7 (L) 12.0 - 15.0 g/dL   HCT 33.0 (L) 36 - 46 %   MCV 88.9 80.0 - 100.0 fL   MCH 29.8 26.0 - 34.0 pg   MCHC 33.5 30.0 - 36.0 g/dL   RDW 07.6 22.6 - 33.3 %   Platelets 339 150 - 400 K/uL   nRBC 0.0 0.0 - 0.2 %    Recent Results (from the past 240 hour(s))  Culture, blood (routine x 2)     Status: None (Preliminary result)   Collection Time: 04/06/20 11:04 AM   Specimen: BLOOD  Result Value Ref Range Status   Specimen Description   Final    BLOOD LEFT ANTECUBITAL Performed at [facility], [address] [place], [place] [address]    Special Requests   Final    BOTTLES DRAWN AEROBIC AND ANAEROBIC Blood Culture adequate volume Performed at [facility], [address] [place], [place] [address]    Culture   Final    NO GROWTH < 24 HOURS Performed at [facility], [address] [place], [place] [address]    Report Status PENDING  Incomplete  Culture, blood (routine x 2)     Status: None (Preliminary result)   Collection Time: 04/06/20 11:04 AM  Specimen: BLOOD  Result Value Ref Range Status   Specimen Description   Final    BLOOD RIGHT ANTECUBITAL Performed at [facility], [address] [place], [place] [address]    Special Requests   Final    BOTTLES DRAWN AEROBIC AND ANAEROBIC Blood Culture adequate volume Performed at [facility], [address], [place], [place] [address]    Culture   Final    NO GROWTH < 24 HOURS Performed at [facility], [address], [place], [place] [address]    Report Status PENDING  Incomplete  Respiratory Panel by RT PCR (Flu A&B, Covid) - Nasopharyngeal Swab     Status: None   Collection Time: 04/06/20 11:06 AM   Specimen: Nasopharyngeal Swab  Result Value Ref Range Status   SARS Coronavirus 2 by RT PCR NEGATIVE NEGATIVE Final    Comment: (NOTE) SARS-CoV-2 target nucleic acids are NOT DETECTED.  The SARS-CoV-2 RNA is generally detectable in upper respiratoy specimens during the acute phase of infection. The lowest concentration of SARS-CoV-2 viral copies this assay can detect is 131 copies/mL. A negative result does not preclude SARS-Cov-2 infection and should not be used as the sole basis for treatment or other patient management decisions. A negative result may occur with  improper specimen collection/handling, submission of specimen other than nasopharyngeal swab, presence of viral mutation(s) within the areas targeted by this assay, and inadequate number of viral copies (<131 copies/mL). A negative result must be combined with clinical observations, patient history, and epidemiological information. The expected result is Negative.  Fact  Sheet for Patients:  https://www.moore.com/  Fact Sheet for Healthcare Providers:  https://www.young.biz/  This test is no t yet approved or cleared by the [place] FDA and  has been authorized for detection and/or diagnosis of SARS-CoV-2 by FDA under an Emergency Use Authorization (EUA). This EUA will remain  in effect (meaning this test can be used) for the duration of the COVID-19 declaration under Section 564(b)(1) of the Act, 21 U.S.C. section 360bbb-3(b)(1), unless the authorization is terminated or revoked sooner.     Influenza A by PCR NEGATIVE NEGATIVE Final   Influenza B by PCR NEGATIVE NEGATIVE Final    Comment: (NOTE) The Xpert Xpress SARS-CoV-2/FLU/RSV assay is intended as an aid in  the diagnosis of influenza from Nasopharyngeal swab specimens and  should not be used as a sole basis for treatment. Nasal washings and  aspirates are unacceptable for Xpert Xpress SARS-CoV-2/FLU/RSV  testing.  Fact Sheet for Patients: https://www.moore.com/  Fact Sheet for Healthcare Providers: https://www.young.biz/  This test is not yet approved or cleared by the [place] FDA and  has been authorized for detection and/or diagnosis of SARS-CoV-2 by  FDA under an Emergency Use Authorization (EUA). This EUA will remain  in effect (meaning this test can be used) for the duration of the  Covid-19 declaration under Section 564(b)(1) of the Act, 21  U.S.C. section 360bbb-3(b)(1), unless the authorization is  terminated or revoked. Performed at [facility], [address] [place], [place] [address]   Aerobic/Anaerobic Culture (surgical/deep wound)     Status: None (Preliminary result)   Collection Time: 04/06/20  4:21 PM   Specimen: Other Source; Body Fluid  Result Value Ref Range Status   Specimen Description   Final    ABSCESS RIGHT HAND Performed at [facility], [address] [place], [place] [address]    Special Requests   Final  NONE Performed at [facility], [address] [place], [place] [address]    Gram Stain   Final    RARE WBC PRESENT, PREDOMINANTLY PMN NO ORGANISMS SEEN    Culture   Final    NO GROWTH < 12 HOURS Performed at [facility], [address] [place], [place] [address]    Report Status PENDING  Incomplete     Radiology Studies: No results found. No orders to display    Scheduled Meds: . heparin  5,000 Units Subcutaneous Q8H   PRN Meds: acetaminophen **OR** acetaminophen, ondansetron **OR** ondansetron (ZOFRAN) IV Continuous Infusions: . ampicillin-sulbactam (UNASYN) IV 3 g (04/07/20 0951)      LOS: 0 days  Time spent: Greater than 50% of the 35 minute visit was spent in counseling/coordination of care for the patient as laid out in the A&P.   Lewie Chamberavid Desani Sprung, MD [facility] 04/07/2020, 11:46 AM

## 2020-04-07 NOTE — Assessment & Plan Note (Signed)
-  Resolving with fluids

## 2020-04-07 NOTE — Assessment & Plan Note (Signed)
-  Replete and recheck as needed 

## 2020-04-07 NOTE — Progress Notes (Signed)
1 Day Post-Op  Subjective: 86 year old female postop from incision and drainage of right hand with Dr. Arita Miss yesterday. Husband at bedside today. She reports she is doing well. She reports some mild pain.  Patient was pleasant on evaluation, was able to answer questions clearly.   Objective: Vital signs in last 24 hours: Temp:  [97.6 F (36.4 C)-98.8 F (37.1 C)] 98.6 F (37 C) (10/15 1342) Pulse Rate:  [68-92] 87 (10/15 1342) Resp:  [14-18] 18 (10/15 1342) BP: (132-153)/(50-69) 142/59 (10/15 1342) SpO2:  [97 %-100 %] 100 % (10/15 1342) Last BM Date: 04/06/20  Intake/Output from previous day: 10/14 0701 - 10/15 0700 In: 1300 [I.V.:1200; IV Piggyback:100] Out: 100 [Blood:100] Intake/Output this shift: Total I/O In: 120 [P.O.:120] Out: -   General appearance: Resting in bed, husband at bedside, able to answer some questions clearly and appears coherent at this time Extremities: Right hand with Kerlix and Ace wrap in place, normal sensation of distal digits, mild swelling of distal digits, no erythema extending to digits noted. Digits nontender to palpation. No swelling or erythema noted of upper forearm.  Lab Results:  CBC Latest Ref Rng & Units 04/07/2020 04/06/2020 10/29/2018  WBC 4.0 - 10.5 K/uL 9.0 11.0(H) 7.6  Hemoglobin 12.0 - 15.0 g/dL 10.7(L) 12.4 11.9  Hematocrit 36 - 46 % 31.9(L) 37.6 34.8  Platelets 150 - 400 K/uL 339 352 342    BMET Recent Labs    04/06/20 1104 04/07/20 0525  NA 137 139  K 3.4* 3.7  CL 101 106  CO2 22 21*  GLUCOSE 110* 117*  BUN 29* 20  CREATININE 1.53* 1.07*  CALCIUM 9.0 8.3*   PT/INR No results for input(s): LABPROT, INR in the last 72 hours. ABG No results for input(s): PHART, HCO3 in the last 72 hours.  Invalid input(s): PCO2, PO2  Studies/Results: No results found.  Anti-infectives: Anti-infectives (From admission, onward)   Start     Dose/Rate Route Frequency Ordered Stop   04/06/20 2200  Ampicillin-Sulbactam (UNASYN) 3 g  in sodium chloride 0.9 % 100 mL IVPB        3 g 200 mL/hr over 30 Minutes Intravenous Every 12 hours 04/06/20 1807     04/06/20 1030  Ampicillin-Sulbactam (UNASYN) 3 g in sodium chloride 0.9 % 100 mL IVPB        3 g 200 mL/hr over 30 Minutes Intravenous  Once 04/06/20 1018 04/06/20 1216      Assessment/Plan: s/p Procedure(s): IRRIGATION AND DEBRIDEMENT RIGHT HAND   Recommend continuing with daily dressing changes as previously noted  "1) Remove dressing and soak hand in saline as long as tolerated (10 - 15 mins if possible) 2) Pack wound with Iodoform packing gauze 3) cover with 4x4 gauze 4) Wrap with Kerlix and ACE Wrap"  Pain improving, currently on Unasyn, cultures pending We will continue to follow     LOS: 0 days    Leslee Home, PA-C 04/07/2020

## 2020-04-07 NOTE — TOC Initial Note (Signed)
Transition of Care [facility]) - Initial/Assessment Note    Patient Details  Name: MARRIETTA Ward MRN: [id number] Date of Birth: 16-Feb-1927  Transition of Care [facility]) CM/SW Contact:    Lennart Pall, LCSW Phone Number: 04/07/2020, 3:49 PM  Clinical Narrative:   Met with pt and spouse this afternoon to review dc needs.  Have explained to spouse that MD has ordered [facility] services to monitor healing of hand. Referral placed with [facility].  Per concerns expressed by family, have also introduced the option to have ongoing case management via [facility]).  Stressed to spouse that this would allow monitoring by RN CM and SW CM at home.  Spouse is agreeable and referral placed to [place] [facility] with [facility].   Likely, will be dc over the weekend with transition to po abx.  Please reach out to weekend [facility] if further needs are identified.               Expected Discharge Plan: [place] Barriers to Discharge: Continued Medical Work up   Patient Goals and CMS Choice Patient states their goals for this hospitalization and ongoing recovery are:: Spouse hopeful she will dc over the weekend   Choice offered to / list presented to : Spouse  Expected Discharge Plan and Services Expected Discharge Plan: [place] In-house Referral: Clinical Social Work, [facility] Choice: [place] arrangements for the past 2 months: Single Family Home                 DME Arranged: N/A DME Agency: NA       HH Arranged: RN [place] Agency: [place] Date [place]: 04/07/20 Time [place]: [id number] Representative spoke with at [place]: Tommi Rumps  Prior Living Arrangements/Services Living arrangements for the past 2 months: [place] Lives with:: Spouse Patient language and need for interpreter reviewed:: Yes Do you feel safe going back to the place where you live?: Yes      Need for Family Participation in  Patient Care: Yes (Comment) Care giver support system in place?: Yes (comment)   Criminal Activity/Legal Involvement Pertinent to Current Situation/Hospitalization: No - Comment as needed  Activities of Daily Living Home Assistive Devices/Equipment: Eyeglasses ADL Screening (condition at time of admission) Patient's cognitive ability adequate to safely complete daily activities?: No Is the patient deaf or have difficulty hearing?: No Does the patient have difficulty seeing, even when wearing glasses/contacts?: No Does the patient have difficulty concentrating, remembering, or making decisions?: Yes Patient able to express need for assistance with ADLs?: Yes Does the patient have difficulty dressing or bathing?: Yes Independently performs ADLs?: No Communication: Independent Dressing (OT): Needs assistance Is this a change from baseline?: Pre-admission baseline Grooming: Independent Feeding: Independent Bathing: Needs assistance Is this a change from baseline?: Pre-admission baseline Toileting: Needs assistance Is this a change from baseline?: Change from baseline, expected to last >3days In/Out Bed: Needs assistance Is this a change from baseline?: Change from baseline, expected to last >3 days Walks in Home: Needs assistance Is this a change from baseline?: Change from baseline, expected to last >3 days Does the patient have difficulty walking or climbing stairs?: Yes Weakness of Legs: None Weakness of Arms/Hands: Right  Permission Sought/Granted Permission sought to share information with : Family Supports                Emotional Assessment Appearance:: Appears stated  age Attitude/Demeanor/Rapport: Unable to Assess Affect (typically observed): Unable to Assess Orientation: : Oriented to Self Alcohol / Substance Use: Not Applicable Psych Involvement: No (comment)  Admission diagnosis:  Cellulitis [L03.90] Cellulitis of right hand [G66.599] Dog bite of right hand,  initial encounter [S61.451A, W54.0XXA] Patient Active Problem List   Diagnosis Date Noted  . Bite wound of right hand with infection 04/07/2020  . Alzheimer's dementia ([place]) 04/07/2020  . AKI (acute kidney injury) ([place]) 04/07/2020  . Hypokalemia 04/07/2020  . Cellulitis 04/06/2020  . Mild cognitive impairment 08/07/2017  . Esophageal reflux 02/02/2016  . Hyperlipidemia LDL goal <100 02/02/2016  . Osteoporosis 05/22/2011  . COPD (chronic obstructive pulmonary disease) ([place]) 05/03/2011  . Hypertension 05/03/2011  . Hypothyroid 05/03/2011   PCP:  [name], MD Pharmacy:   [facility], [address], [address] [place], [place] [address] Phone: [phone] Fax: [place], [place] [address] Phone: [phone] Fax: [phone]     Social Determinants of Health (SDOH) Interventions    Readmission Risk Interventions Readmission Risk Prevention Plan 04/07/2020  Post Dischage Appt Complete  Medication Screening Complete  Transportation Screening Complete  Some recent data might be hidden

## 2020-04-07 NOTE — Assessment & Plan Note (Addendum)
-   TDAP given on 10/14 - s/p I&D on 10/14 with surgery - dressing changes per surgery: Remove dressing and soak hand in saline as long as tolerated (10 - 15 mins if possible). Pack wound with Iodoform packing gauze. Cover with 4x4 gauze. Wrap with Kerlix and ACE Wrap - on Unasyn in hospital; follow up cultures (remain negative);  - discharged with Augmentin to complete course - outpatient followup with surgery

## 2020-04-08 DIAGNOSIS — Z659 Problem related to unspecified psychosocial circumstances: Secondary | ICD-10-CM

## 2020-04-08 DIAGNOSIS — G309 Alzheimer's disease, unspecified: Secondary | ICD-10-CM | POA: Diagnosis not present

## 2020-04-08 DIAGNOSIS — L03818 Cellulitis of other sites: Secondary | ICD-10-CM | POA: Diagnosis not present

## 2020-04-08 DIAGNOSIS — S61451A Open bite of right hand, initial encounter: Secondary | ICD-10-CM | POA: Diagnosis not present

## 2020-04-08 LAB — CBC WITH DIFFERENTIAL/PLATELET
Abs Immature Granulocytes: 0.02 10*3/uL (ref 0.00–0.07)
Basophils Absolute: 0 10*3/uL (ref 0.0–0.1)
Basophils Relative: 0 %
Eosinophils Absolute: 0.1 10*3/uL (ref 0.0–0.5)
Eosinophils Relative: 1 %
HCT: 30.2 % — ABNORMAL LOW (ref 36.0–46.0)
Hemoglobin: 10.1 g/dL — ABNORMAL LOW (ref 12.0–15.0)
Immature Granulocytes: 0 %
Lymphocytes Relative: 23 %
Lymphs Abs: 1.6 10*3/uL (ref 0.7–4.0)
MCH: 29.9 pg (ref 26.0–34.0)
MCHC: 33.4 g/dL (ref 30.0–36.0)
MCV: 89.3 fL (ref 80.0–100.0)
Monocytes Absolute: 0.8 10*3/uL (ref 0.1–1.0)
Monocytes Relative: 11 %
Neutro Abs: 4.7 10*3/uL (ref 1.7–7.7)
Neutrophils Relative %: 65 %
Platelets: 374 10*3/uL (ref 150–400)
RBC: 3.38 MIL/uL — ABNORMAL LOW (ref 3.87–5.11)
RDW: 13.5 % (ref 11.5–15.5)
WBC: 7.2 10*3/uL (ref 4.0–10.5)
nRBC: 0 % (ref 0.0–0.2)

## 2020-04-08 LAB — BASIC METABOLIC PANEL
Anion gap: 10 (ref 5–15)
BUN: 20 mg/dL (ref 8–23)
CO2: 22 mmol/L (ref 22–32)
Calcium: 8.1 mg/dL — ABNORMAL LOW (ref 8.9–10.3)
Chloride: 107 mmol/L (ref 98–111)
Creatinine, Ser: 1.14 mg/dL — ABNORMAL HIGH (ref 0.44–1.00)
GFR, Estimated: 42 mL/min — ABNORMAL LOW (ref >60)
Glucose, Bld: 100 mg/dL — ABNORMAL HIGH (ref 70–99)
Potassium: 3.5 mmol/L (ref 3.5–5.1)
Sodium: 139 mmol/L (ref 135–145)

## 2020-04-08 LAB — MAGNESIUM: Magnesium: 2.1 mg/dL (ref 1.7–2.4)

## 2020-04-08 NOTE — Progress Notes (Signed)
PROGRESS NOTE    Laurie Ward   NGE:[id number]  DOB: 07/30/1926  DOA: 04/06/2020     1  PCP: Ronnald NianLalonde, John C, MD  CC: hand wound  Hospital Course: Laurie MalloryGloria S Cavell is a 86 y.o. female with PMH severe Alz dementia who presented with a right hand wound from a dog bite. She was initially treated with PCN shot then oral abx at home, however the abx were not taken for unknown reasons (lost, forgotten/dementia). She returned to the hospital with worsening pain and swelling.  She was taken to the OR on 10/14 for I&D. She was treated with Unasyn on admission. OR would cultures have remained negative.  For care at home after discharge [facility]    Interval History:  Seen this morning resting in bed in her typical demented state.  Arm wrapped in bandage and swollen fingertips noted.   Sitter bedside. She was able to talk to me just a little saying hello this time and she was in no pain (but did flinch when I palpated her wrist).   Old records reviewed in assessment of this patient  ROS: Review of systems not obtained due to patient factors.  Severe dementia  Assessment & Plan: * Cellulitis - see bite wound as well - currently on Unasyn; follow up cultures (still negative) - continue pain control  Bite wound of right hand with infection - TDAP given on 10/14 - s/p I&D on 10/14 with surgery - dressing changes per surgery: Remove dressing and soak hand in saline as long as tolerated (10 - 15 mins if possible). Pack wound with Iodoform packing gauze. Cover with 4x4 gauze. Wrap with Kerlix and ACE Wrap - continue Unasyn for now; follow up cultures (remain negative); likely d/c on Augmentin   Poor social situation - family also concerned about patient's home situation.  Possible that husband may not be able to effectively continue to care for her -Social work [facility] assisted [facility] Home Health RN at discharge -Referral placed to  [facility] as well  Hypokalemia -Replete and recheck as needed  AKI (acute kidney injury) (HCC) -Resolving with fluids  Alzheimer's dementia (HCC) - continue home regimen - will still need sitter for safety; trying to get OOB  Hypothyroid -Patient on brand-name Euthyrox 75 mcg daily - okay to hold while in hospital unless prolonged stay; resume at discharge    Antimicrobials: Unasyn 04/06/2020>> present  DVT prophylaxis: HSQ Code Status: Full Family Communication: None present Disposition Plan: Status is: Observation  The patient remains OBS appropriate and will d/c before 2 midnights.  Dispo: The patient is from: Home              Anticipated d/c is to: Home              Anticipated d/c date is: 1 day              Patient currently is not medically stable to d/c.  Objective: Blood pressure (!) 162/73, pulse 81, temperature 98.7 F (37.1 C), temperature source Oral, resp. rate 17, height 5\' 1"  (1.549 m), weight 57 kg, SpO2 99 %.  Examination: General appearance: severely demented, unable to answer questions or follow commands; no distress and resting in bed Head: Normocephalic, without obvious abnormality, atraumatic Eyes: EOMI Lungs: clear to auscultation bilaterally Heart: regular rate and rhythm and S1, S2 normal Abdomen: normal findings: bowel sounds normal and soft, non-tender Extremities:  right arm wrapped in bandage; edematous fingertips and warm/well perfused. TTP at surgical site as expected Skin: mobility and turgor normal Neurologic: Moves all 4 extremities.  Unable to follow any commands due to underlying severe dementia  Consultants:   Plastic surgery  Procedures:   R hand I&D: 04/06/20  Data Reviewed: I have personally reviewed following labs and imaging studies Results for orders placed or performed during the hospital encounter of 04/06/20 (from the past 24 hour(s))  Basic metabolic panel     Status: Abnormal   Collection Time:  04/08/20  6:23 AM  Result Value Ref Range   Sodium 139 135 - 145 mmol/L   Potassium 3.5 3.5 - 5.1 mmol/L   Chloride 107 98 - 111 mmol/L   CO2 22 22 - 32 mmol/L   Glucose, Bld 100 (H) 70 - 99 mg/dL   BUN 20 8 - 23 mg/dL   Creatinine, Ser 4.33 (H) 0.44 - 1.00 mg/dL   Calcium 8.1 (L) 8.9 - 10.3 mg/dL   GFR, Estimated 42 (L) >60 mL/min   Anion gap 10 5 - 15  CBC with Differential/Platelet     Status: Abnormal   Collection Time: 04/08/20  6:23 AM  Result Value Ref Range   WBC 7.2 4.0 - 10.5 K/uL   RBC 3.38 (L) 3.87 - 5.11 MIL/uL   Hemoglobin 10.1 (L) 12.0 - 15.0 g/dL   HCT 29.5 (L) 36 - 46 %   MCV 89.3 80.0 - 100.0 fL   MCH 29.9 26.0 - 34.0 pg   MCHC 33.4 30.0 - 36.0 g/dL   RDW 18.8 41.6 - 60.6 %   Platelets 374 150 - 400 K/uL   nRBC 0.0 0.0 - 0.2 %   Neutrophils Relative % 65 %   Neutro Abs 4.7 1.7 - 7.7 K/uL   Lymphocytes Relative 23 %   Lymphs Abs 1.6 0.7 - 4.0 K/uL   Monocytes Relative 11 %   Monocytes Absolute 0.8 0.1 - 1.0 K/uL   Eosinophils Relative 1 %   Eosinophils Absolute 0.1 0.0 - 0.5 K/uL   Basophils Relative 0 %   Basophils Absolute 0.0 0.0 - 0.1 K/uL   Immature Granulocytes 0 %   Abs Immature Granulocytes 0.02 0.00 - 0.07 K/uL  Magnesium     Status: None   Collection Time: 04/08/20  6:23 AM  Result Value Ref Range   Magnesium 2.1 1.7 - 2.4 mg/dL    Recent Results (from the past 240 hour(s))  Culture, blood (routine x 2)     Status: None (Preliminary result)   Collection Time: 04/06/20 11:04 AM   Specimen: BLOOD  Result Value Ref Range Status   Specimen Description   Final    BLOOD LEFT ANTECUBITAL Performed at [facility], [address] [place], [place] [address]    Special Requests   Final    BOTTLES DRAWN AEROBIC AND ANAEROBIC Blood Culture adequate volume Performed at [facility], [address], [place], [place] [address]    Culture   Final    NO GROWTH 2 DAYS Performed at [facility], [address], [place], [place] [address]    Report Status PENDING  Incomplete  Culture, blood (routine x 2)     Status: None (Preliminary result)   Collection Time: 04/06/20 11:04 AM   Specimen: BLOOD  Result Value Ref Range Status   Specimen Description   Final    BLOOD RIGHT ANTECUBITAL Performed at [facility], [address] [place], [place] [address]    Special Requests   Final    BOTTLES DRAWN AEROBIC AND ANAEROBIC Blood Culture adequate volume Performed at [facility], [address] [place], [place] [address]    Culture   Final    NO GROWTH 2 DAYS Performed at [facility], [address], [place], [place] [address]    Report Status PENDING  Incomplete  Respiratory Panel by RT PCR (Flu A&B, Covid) - Nasopharyngeal Swab     Status: None   Collection Time: 04/06/20 11:06 AM   Specimen: Nasopharyngeal Swab  Result Value Ref Range Status   SARS Coronavirus 2 by RT PCR NEGATIVE NEGATIVE Final    Comment: (NOTE) SARS-CoV-2 target nucleic acids are NOT DETECTED.  The SARS-CoV-2 RNA is generally detectable in upper respiratoy specimens during the acute phase of infection. The lowest concentration of SARS-CoV-2 viral copies this assay can detect is 131 copies/mL. A negative result does not preclude SARS-Cov-2 infection and should not be used as the sole basis for treatment or other patient management decisions. A negative result may occur with  improper specimen collection/handling, submission of specimen other than nasopharyngeal swab, presence of viral mutation(s) within the areas targeted by this assay, and inadequate number of viral copies (<131 copies/mL). A negative result must be combined with clinical observations, patient history, and epidemiological information. The expected result is Negative.  Fact Sheet for Patients:  https://www.moore.com/  Fact Sheet for Healthcare Providers:    https://www.young.biz/  This test is no t yet approved or cleared by the [place] FDA and  has been authorized for detection and/or diagnosis of SARS-CoV-2 by FDA under an Emergency Use Authorization (EUA). This EUA will remain  in effect (meaning this test can be used) for the duration of the COVID-19 declaration under Section 564(b)(1) of the Act, 21 U.S.C. section 360bbb-3(b)(1), unless the authorization is terminated or revoked sooner.     Influenza A by PCR NEGATIVE NEGATIVE Final   Influenza B by PCR NEGATIVE NEGATIVE Final    Comment: (NOTE) The Xpert Xpress SARS-CoV-2/FLU/RSV assay is intended as an aid in  the diagnosis of influenza from Nasopharyngeal swab specimens and  should not be used as a sole basis for treatment. Nasal washings and  aspirates are unacceptable for Xpert Xpress SARS-CoV-2/FLU/RSV  testing.  Fact Sheet for Patients: https://www.moore.com/  Fact Sheet for Healthcare Providers: https://www.young.biz/  This test is not yet approved or cleared by the [place] FDA and  has been authorized for detection and/or diagnosis of SARS-CoV-2 by  FDA under an Emergency Use Authorization (EUA). This EUA will remain  in effect (meaning this test can be used) for the duration of the  Covid-19 declaration under Section 564(b)(1) of the Act, 21  U.S.C. section 360bbb-3(b)(1), unless the authorization is  terminated or revoked. Performed at [facility], [address] [place], [place] [address]   Aerobic/Anaerobic Culture (surgical/deep wound)     Status: None (Preliminary result)   Collection Time: 04/06/20  4:21 PM   Specimen: Other Source; Body Fluid  Result Value Ref Range Status   Specimen Description   Final    ABSCESS RIGHT HAND Performed at [facility], [address] [place], [place] [address]    Special Requests   Final    NONE Performed at  [facility], [address], [place], [place] [address]    Gram Stain   Final  RARE WBC PRESENT, PREDOMINANTLY PMN NO ORGANISMS SEEN    Culture   Final    NO GROWTH 2 DAYS NO ANAEROBES ISOLATED; CULTURE IN PROGRESS FOR 5 DAYS Performed at [facility], [address], [place], [place] [address]    Report Status PENDING  Incomplete     Radiology Studies: No results found. No orders to display    Scheduled Meds: . heparin  5,000 Units Subcutaneous Q8H   PRN Meds: acetaminophen **OR** acetaminophen, ondansetron **OR** ondansetron (ZOFRAN) IV Continuous Infusions: . ampicillin-sulbactam (UNASYN) IV 3 g (04/08/20 1100)      LOS: 1 day  Time spent: Greater than 50% of the 35 minute visit was spent in counseling/coordination of care for the patient as laid out in the A&P.   Lewie Chamber, MD [facility] 04/08/2020, 3:00 PM

## 2020-04-08 NOTE — Assessment & Plan Note (Signed)
-   family also concerned about patient's home situation.  Possible that husband may not be able to effectively continue to care for her -Social work has assisted with referral placed to [facility]. Home Health RN at discharge -Referral placed to [facility] as well

## 2020-04-09 DIAGNOSIS — S61451A Open bite of right hand, initial encounter: Secondary | ICD-10-CM | POA: Diagnosis not present

## 2020-04-09 DIAGNOSIS — L089 Local infection of the skin and subcutaneous tissue, unspecified: Secondary | ICD-10-CM | POA: Diagnosis not present

## 2020-04-09 DIAGNOSIS — L03818 Cellulitis of other sites: Secondary | ICD-10-CM | POA: Diagnosis not present

## 2020-04-09 LAB — CBC WITH DIFFERENTIAL/PLATELET
Abs Immature Granulocytes: 0.03 10*3/uL (ref 0.00–0.07)
Basophils Absolute: 0 10*3/uL (ref 0.0–0.1)
Basophils Relative: 1 %
Eosinophils Absolute: 0.1 10*3/uL (ref 0.0–0.5)
Eosinophils Relative: 1 %
HCT: 31.3 % — ABNORMAL LOW (ref 36.0–46.0)
Hemoglobin: 10.4 g/dL — ABNORMAL LOW (ref 12.0–15.0)
Immature Granulocytes: 0 %
Lymphocytes Relative: 19 %
Lymphs Abs: 1.4 10*3/uL (ref 0.7–4.0)
MCH: 29.5 pg (ref 26.0–34.0)
MCHC: 33.2 g/dL (ref 30.0–36.0)
MCV: 88.7 fL (ref 80.0–100.0)
Monocytes Absolute: 0.6 10*3/uL (ref 0.1–1.0)
Monocytes Relative: 9 %
Neutro Abs: 5 10*3/uL (ref 1.7–7.7)
Neutrophils Relative %: 70 %
Platelets: 411 10*3/uL — ABNORMAL HIGH (ref 150–400)
RBC: 3.53 MIL/uL — ABNORMAL LOW (ref 3.87–5.11)
RDW: 13.3 % (ref 11.5–15.5)
WBC: 7.2 10*3/uL (ref 4.0–10.5)
nRBC: 0 % (ref 0.0–0.2)

## 2020-04-09 LAB — MAGNESIUM: Magnesium: 2.1 mg/dL (ref 1.7–2.4)

## 2020-04-09 LAB — BASIC METABOLIC PANEL
Anion gap: 10 (ref 5–15)
BUN: 17 mg/dL (ref 8–23)
CO2: 23 mmol/L (ref 22–32)
Calcium: 8.4 mg/dL — ABNORMAL LOW (ref 8.9–10.3)
Chloride: 108 mmol/L (ref 98–111)
Creatinine, Ser: 0.88 mg/dL (ref 0.44–1.00)
GFR, Estimated: 57 mL/min — ABNORMAL LOW (ref >60)
Glucose, Bld: 111 mg/dL — ABNORMAL HIGH (ref 70–99)
Potassium: 3.4 mmol/L — ABNORMAL LOW (ref 3.5–5.1)
Sodium: 141 mmol/L (ref 135–145)

## 2020-04-09 MED ORDER — SODIUM CHLORIDE 0.9 % IV SOLN
3.0000 g | Freq: Three times a day (TID) | INTRAVENOUS | Status: DC
  Filled 2020-04-09: qty 8

## 2020-04-09 MED ORDER — AMOXICILLIN-POT CLAVULANATE 875-125 MG PO TABS: 1.0000 | ORAL_TABLET | ORAL | Status: DC

## 2020-04-09 MED ORDER — AMOXICILLIN-POT CLAVULANATE 875-125 MG PO TABS: 1.0000 | ORAL_TABLET | Freq: Two times a day (BID) | ORAL | 0 refills | Status: AC

## 2020-04-09 MED ORDER — AMOXICILLIN-POT CLAVULANATE 875-125 MG PO TABS
1.0000 | ORAL_TABLET | ORAL | Status: AC
  Administered 2020-04-09: 14:00:00 1 via ORAL
  Filled 2020-04-09: qty 1

## 2020-04-09 NOTE — Discharge Summary (Signed)
Physician Discharge Summary   Laurie MalloryGloria S Ward ZOX:[id number] DOB: 03/18/1927 DOA: 04/06/2020  PCP: Ronnald NianLalonde, John C, MD  Admit date: 04/06/2020 Discharge date: 04/09/2020  Admitted From: home Disposition:  home Discharging physician: Lewie Chamberavid Tae Vonada, MD  Recommendations for Outpatient Follow-up:  1. Follow up with surgery 2. If further concern for home situation, may need to consider SNF  Home Health: RN Equipment/Devices: none  Patient discharged to home in Discharge Condition: stable CODE STATUS: Full Diet recommendation:  Diet Orders (From admission, onward)    Start     Ordered   04/09/20 0000  Diet general        04/09/20 1244   04/07/20 0914  Diet regular Room service appropriate? Yes; Fluid consistency: Thin  Diet effective now       Question Answer Comment  Room service appropriate? Yes   Fluid consistency: Thin      04/07/20 0914          Hospital Course: Laurie Ward is a 86 y.o. female with PMH severe Alz dementia who presented with a right hand wound from a dog bite. She was initially treated with PCN shot then oral abx at home, however the abx were not taken for unknown reasons (lost, forgotten/dementia). She returned to the hospital with worsening pain and swelling.  She was taken to the OR on 10/14 for I&D. She was treated with Unasyn on admission. OR would cultures have remained negative.  [facility] at [facility] after discharge [facility]  She was discharge with Augmentin to complete course.    * Cellulitis - see bite wound as well - Unasyn changed to Augmentin at d/c  - continue pain control  Bite wound of right hand with infection - TDAP given on 10/14 - s/p I&D on 10/14 with surgery - dressing changes per surgery: Remove dressing and soak hand in saline as long as tolerated (10 - 15 mins if possible). Pack wound with Iodoform packing gauze. Cover with 4x4 gauze. Wrap with Kerlix and ACE Wrap - on Unasyn in hospital; follow up  cultures (remain negative);  - discharged with Augmentin to complete course - outpatient followup with surgery   Poor social situation - family also concerned about patient's home situation.  Possible that husband may not be able to effectively continue to care for her -Social work has assisted with [facility]. Home Health RN at discharge -Referral placed to [facility] as well  Alzheimer's dementia [facility](HCC) - continue home regimen - discharged home in care of husband   Hypothyroid -Patient on brand-name Euthyrox 75 mcg daily - okay to hold while in hospital unless prolonged stay; resume at discharge   Hypokalemia-resolved as of 04/09/2020 -Replete and recheck as needed  AKI (acute kidney injury) (HCC)-resolved as of 04/09/2020 -Resolving with fluids    The patient's chronic medical conditions were treated accordingly per the patient's home medication regimen except as noted.  On day of discharge, patient was felt deemed stable for discharge. Patient/family member advised to call PCP or come back to ER if needed.   Principal Diagnosis: Cellulitis  Discharge Diagnoses: Active Hospital Problems   Diagnosis Date Noted  . Cellulitis 04/06/2020    Priority: High  . Bite wound of right hand with infection 04/07/2020    Priority: High  . Poor social situation 04/08/2020  . Alzheimer's dementia (HCC) 04/07/2020  . Hypertension 05/03/2011  . Hypothyroid 05/03/2011    Resolved Hospital Problems  Diagnosis Date Noted Date Resolved  . AKI (acute kidney injury) (HCC) 04/07/2020 04/09/2020  . Hypokalemia 04/07/2020 04/09/2020    Discharge Instructions    Diet general   Complete by: As directed    Discharge wound care:   Complete by: As directed    Remove dressing and soak hand in saline as long as tolerated (10 - 15 mins if possible). Pack wound with Iodoform packing gauze. Cover with 4x4 gauze. Wrap with Kerlix and ACE Wrap   Increase  activity slowly   Complete by: As directed      Allergies as of 04/09/2020      Reactions   Levaquin [levofloxacin In D5w] Other (See Comments)   unk      Medication List    STOP taking these medications   donepezil 10 MG tablet Commonly known as: Aricept   donepezil 5 MG tablet Commonly known as: ARICEPT   esomeprazole 40 MG capsule Commonly known as: NEXIUM   Euthyrox 75 MCG tablet Generic drug: levothyroxine   Fluzone High-Dose Quadrivalent 0.7 ML Susy Generic drug: Influenza Vac High-Dose Quad   polyethylene glycol powder 17 GM/SCOOP powder Commonly known as: GLYCOLAX/MIRALAX   simvastatin 40 MG tablet Commonly known as: ZOCOR   traZODone 50 MG tablet Commonly known as: DESYREL     TAKE these medications   acetaminophen 500 MG tablet Commonly known as: TYLENOL Take 500 mg by mouth every 6 (six) hours as needed for mild pain.   amoxicillin-clavulanate 875-125 MG tablet Commonly known as: Augmentin Take 1 tablet by mouth 2 (two) times daily for 7 days.   lisinopril-hydrochlorothiazide 20-12.5 MG tablet Commonly known as: Zestoretic Take 1 tablet by mouth daily.   PRESERVISION AREDS 2+MULTI VIT PO Take 1 tablet by mouth daily.            Discharge Care Instructions  (From admission, onward)         Start     Ordered   04/09/20 0000  Discharge wound care:       Comments: Remove dressing and soak hand in saline as long as tolerated (10 - 15 mins if possible). Pack wound with Iodoform packing gauze. Cover with 4x4 gauze. Wrap with Kerlix and ACE Wrap   04/09/20 1244          Follow-up Information    Pace, Wendy Poet, MD. Schedule an appointment as soon as possible for a visit in 1 week(s).   Specialty: Plastic Surgery Contact information: [address] [place] [address] [phone]              Allergies  Allergen Reactions  . Levaquin [Levofloxacin In D5w] Other (See Comments)    unk    Consultations: Plastic  Surgery  Discharge Exam: BP (!) 162/86   Pulse 76   Temp 97.7 F (36.5 C) (Oral)   Resp 16   Ht 5\' 1"  (1.549 m)   Wt 57 kg   SpO2 98%   BMI 23.74 kg/m  General appearance: severely demented; follows some commands occasionally  Head: Normocephalic, without obvious abnormality, atraumatic Eyes: EOMI Lungs: clear to auscultation bilaterally Heart: regular rate and rhythm and S1, S2 normal Abdomen: normal findings: bowel sounds normal and soft, non-tender Extremities: right arm wrapped in bandage; edematous fingertips and warm/well perfused. TTP at surgical site as expected Skin: mobility and turgor normal Neurologic: Moves all 4 extremities  The results of significant diagnostics from this hospitalization (including imaging, microbiology, ancillary and laboratory) are listed  below for reference.   Microbiology: Recent Results (from the past 240 hour(s))  Culture, blood (routine x 2)     Status: None (Preliminary result)   Collection Time: 04/06/20 11:04 AM   Specimen: BLOOD  Result Value Ref Range Status   Specimen Description   Final    BLOOD LEFT ANTECUBITAL Performed at [facility], [address] [place], [place] [address]    Special Requests   Final    BOTTLES DRAWN AEROBIC AND ANAEROBIC Blood Culture adequate volume Performed at [facility], [address], [place], [place] [address]    Culture   Final    NO GROWTH 3 DAYS Performed at [facility], [address] [place], [place] [address]    Report Status PENDING  Incomplete  Culture, blood (routine x 2)     Status: None (Preliminary result)   Collection Time: 04/06/20 11:04 AM   Specimen: BLOOD  Result Value Ref Range Status   Specimen Description   Final    BLOOD RIGHT ANTECUBITAL Performed at [facility], [address], [place], [place] [address]    Special Requests   Final    BOTTLES DRAWN AEROBIC AND ANAEROBIC Blood Culture adequate  volume Performed at [facility], [address] [place], [place] [address]    Culture   Final    NO GROWTH 3 DAYS Performed at [facility], [address], [place], [place] [address]    Report Status PENDING  Incomplete  Respiratory Panel by RT PCR (Flu A&B, Covid) - Nasopharyngeal Swab     Status: None   Collection Time: 04/06/20 11:06 AM   Specimen: Nasopharyngeal Swab  Result Value Ref Range Status   SARS Coronavirus 2 by RT PCR NEGATIVE NEGATIVE Final    Comment: (NOTE) SARS-CoV-2 target nucleic acids are NOT DETECTED.  The SARS-CoV-2 RNA is generally detectable in upper respiratoy specimens during the acute phase of infection. The lowest concentration of SARS-CoV-2 viral copies this assay can detect is 131 copies/mL. A negative result does not preclude SARS-Cov-2 infection and should not be used as the sole basis for treatment or other patient management decisions. A negative result may occur with  improper specimen collection/handling, submission of specimen other than nasopharyngeal swab, presence of viral mutation(s) within the areas targeted by this assay, and inadequate number of viral copies (<131 copies/mL). A negative result must be combined with clinical observations, patient history, and epidemiological information. The expected result is Negative.  Fact Sheet for Patients:  https://www.moore.com/  Fact Sheet for Healthcare Providers:  https://www.young.biz/  This test is no t yet approved or cleared by the [place] FDA and  has been authorized for detection and/or diagnosis of SARS-CoV-2 by FDA under an Emergency Use Authorization (EUA). This EUA will remain  in effect (meaning this test can be used) for the duration of the COVID-19 declaration under Section 564(b)(1) of the Act, 21 U.S.C. section 360bbb-3(b)(1), unless the authorization is terminated or revoked sooner.     Influenza A  by PCR NEGATIVE NEGATIVE Final   Influenza B by PCR NEGATIVE NEGATIVE Final    Comment: (NOTE) The Xpert Xpress SARS-CoV-2/FLU/RSV assay is intended as an aid in  the diagnosis of influenza from Nasopharyngeal swab specimens and  should not be used as a sole basis for treatment. Nasal washings and  aspirates are unacceptable for Xpert Xpress SARS-CoV-2/FLU/RSV  testing.  Fact Sheet for Patients: https://www.moore.com/  Fact Sheet  for Healthcare Providers: https://www.young.biz/  This test is not yet approved or cleared by the [place] and  has been authorized for detection and/or diagnosis of SARS-CoV-2 by  FDA under an Emergency Use Authorization (EUA). This EUA will remain  in effect (meaning this test can be used) for the duration of the  Covid-19 declaration under Section 564(b)(1) of the Act, 21  U.S.C. section 360bbb-3(b)(1), unless the authorization is  terminated or revoked. Performed at [facility], [address] [place], [place] [address]   Aerobic/Anaerobic Culture (surgical/deep wound)     Status: None (Preliminary result)   Collection Time: 04/06/20  4:21 PM   Specimen: Other Source; Body Fluid  Result Value Ref Range Status   Specimen Description   Final    ABSCESS RIGHT HAND Performed at [facility], [address], [place], [place] [address]    Special Requests   Final    NONE Performed at [facility], [address], [place], [place] [address]    Gram Stain   Final    RARE WBC PRESENT, PREDOMINANTLY PMN NO ORGANISMS SEEN    Culture   Final    RARE STAPHYLOCOCCUS CAPITIS RARE EIKENELLA CORRODENS Usually susceptible to penicillin and other beta lactam agents,quinolones,macrolides and tetracyclines. Performed at [facility], [address] [place], [place] [address]    Report Status PENDING  Incomplete     Labs: BNP (last 3 results) No  results for input(s): BNP in the last 8760 hours. Basic Metabolic Panel: Recent Labs  Lab 04/06/20 1104 04/07/20 0525 04/08/20 0623 04/09/20 0556  NA 137 139 139 141  K 3.4* 3.7 3.5 3.4*  CL 101 106 107 108  CO2 22 21* 22 23  GLUCOSE 110* 117* 100* 111*  BUN 29* CREATININE 1.53* 1.07* 1.14* 0.88  CALCIUM 9.0 8.3* 8.1* 8.4*  MG  --   --  2.1 2.1   Liver Function Tests: Recent Labs  Lab 04/07/20 0525  AST 20  ALT 16  ALKPHOS 52  BILITOT 1.0  PROT 6.8  ALBUMIN 3.1*   No results for input(s): LIPASE, AMYLASE in the last 168 hours. No results for input(s): AMMONIA in the last 168 hours. CBC: Recent Labs  Lab 04/06/20 1104 04/07/20 0525 04/08/20 0623 04/09/20 0556  WBC 11.0* 9.0 7.2 7.2  NEUTROABS 8.0*  --  4.7 5.0  HGB 12.4 10.7* 10.1* 10.4*  HCT 37.6 31.9* 30.2* 31.3*  MCV 90.2 88.9 89.3 88.7  PLT 352 339 374 411*   Cardiac Enzymes: No results for input(s): CKTOTAL, CKMB, CKMBINDEX, TROPONINI in the last 168 hours. BNP: Invalid input(s): POCBNP CBG: No results for input(s): GLUCAP in the last 168 hours. D-Dimer No results for input(s): DDIMER in the last 72 hours. Hgb A1c No results for input(s): HGBA1C in the last 72 hours. Lipid Profile No results for input(s): CHOL, HDL, LDLCALC, TRIG, CHOLHDL, LDLDIRECT in the last 72 hours. Thyroid function studies No results for input(s): TSH, T4TOTAL, T3FREE, THYROIDAB in the last 72 hours.  Invalid input(s): FREET3 Anemia work up No results for input(s): VITAMINB12, FOLATE, FERRITIN, TIBC, IRON, RETICCTPCT in the last 72 hours. Urinalysis    Component Value Date/Time   COLORURINE YELLOW 10/16/2012 0120   APPEARANCEUR CLEAR 10/16/2012 0120   LABSPEC 1.019 10/16/2012 0120   PHURINE 5.5 10/16/2012 0120   GLUCOSEU NEGATIVE 10/16/2012 0120   HGBUR NEGATIVE 10/16/2012 0120   BILIRUBINUR n 11/22/2016 1132  KETONESUR NEGATIVE 10/16/2012 0120   PROTEINUR n 11/22/2016 1132   PROTEINUR NEGATIVE 10/16/2012  0120   UROBILINOGEN 0.2 11/22/2016 1132   UROBILINOGEN 0.2 10/16/2012 0120   NITRITE n 11/22/2016 1132   NITRITE NEGATIVE 10/16/2012 0120   LEUKOCYTESUR Negative 11/22/2016 1132   Sepsis Labs Invalid input(s): PROCALCITONIN,  WBC,  LACTICIDVEN Microbiology Recent Results (from the past 240 hour(s))  Culture, blood (routine x 2)     Status: None (Preliminary result)   Collection Time: 04/06/20 11:04 AM   Specimen: BLOOD  Result Value Ref Range Status   Specimen Description   Final    BLOOD LEFT ANTECUBITAL Performed at [facility], [address] [place], [place] [address]    Special Requests   Final    BOTTLES DRAWN AEROBIC AND ANAEROBIC Blood Culture adequate volume Performed at [facility], [address], [place], [place] [address]    Culture   Final    NO GROWTH 3 DAYS Performed at [facility], [address], [place], [place] [address]    Report Status PENDING  Incomplete  Culture, blood (routine x 2)     Status: None (Preliminary result)   Collection Time: 04/06/20 11:04 AM   Specimen: BLOOD  Result Value Ref Range Status   Specimen Description   Final    BLOOD RIGHT ANTECUBITAL Performed at [facility], [address] [place], [place] [address]    Special Requests   Final    BOTTLES DRAWN AEROBIC AND ANAEROBIC Blood Culture adequate volume Performed at [facility], [address] [place], [place] [address]    Culture   Final    NO GROWTH 3 DAYS Performed at [facility], [address], [place], [place] [address]    Report Status PENDING  Incomplete  Respiratory Panel by RT PCR (Flu A&B, Covid) - Nasopharyngeal Swab     Status: None   Collection Time: 04/06/20 11:06 AM   Specimen: Nasopharyngeal Swab  Result Value Ref Range Status   SARS Coronavirus 2 by RT PCR NEGATIVE NEGATIVE Final    Comment: (NOTE) SARS-CoV-2 target nucleic acids are NOT DETECTED.  The  SARS-CoV-2 RNA is generally detectable in upper respiratoy specimens during the acute phase of infection. The lowest concentration of SARS-CoV-2 viral copies this assay can detect is 131 copies/mL. A negative result does not preclude SARS-Cov-2 infection and should not be used as the sole basis for treatment or other patient management decisions. A negative result may occur with  improper specimen collection/handling, submission of specimen other than nasopharyngeal swab, presence of viral mutation(s) within the areas targeted by this assay, and inadequate number of viral copies (<131 copies/mL). A negative result must be combined with clinical observations, patient history, and epidemiological information. The expected result is Negative.  Fact Sheet for Patients:  https://www.moore.com/  Fact Sheet for Healthcare Providers:  https://www.young.biz/  This test is no t yet approved or cleared by the [place] FDA and  has been authorized for detection and/or diagnosis of SARS-CoV-2 by FDA under an Emergency Use Authorization (EUA). This EUA will remain  in effect (meaning this test can be used) for the duration of the COVID-19 declaration under Section 564(b)(1) of the Act, 21 U.S.C. section 360bbb-3(b)(1), unless the authorization is terminated or revoked sooner.     Influenza A by PCR NEGATIVE NEGATIVE Final   Influenza B by PCR NEGATIVE NEGATIVE Final    Comment: (NOTE) The Xpert  Xpress SARS-CoV-2/FLU/RSV assay is intended as an aid in  the diagnosis of influenza from Nasopharyngeal swab specimens and  should not be used as a sole basis for treatment. Nasal washings and  aspirates are unacceptable for Xpert Xpress SARS-CoV-2/FLU/RSV  testing.  Fact Sheet for Patients: https://www.moore.com/  Fact Sheet for Healthcare Providers: https://www.young.biz/  This test is not yet approved or cleared  by the [place] FDA and  has been authorized for detection and/or diagnosis of SARS-CoV-2 by  FDA under an Emergency Use Authorization (EUA). This EUA will remain  in effect (meaning this test can be used) for the duration of the  Covid-19 declaration under Section 564(b)(1) of the Act, 21  U.S.C. section 360bbb-3(b)(1), unless the authorization is  terminated or revoked. Performed at [facility], [address] [place], [place] [address]   Aerobic/Anaerobic Culture (surgical/deep wound)     Status: None (Preliminary result)   Collection Time: 04/06/20  4:21 PM   Specimen: Other Source; Body Fluid  Result Value Ref Range Status   Specimen Description   Final    ABSCESS RIGHT HAND Performed at [facility], [address] [place], [place] [address]    Special Requests   Final    NONE Performed at [facility], [address] [place], [place] [address]    Gram Stain   Final    RARE WBC PRESENT, PREDOMINANTLY PMN NO ORGANISMS SEEN    Culture   Final    RARE STAPHYLOCOCCUS CAPITIS RARE EIKENELLA CORRODENS Usually susceptible to penicillin and other beta lactam agents,quinolones,macrolides and tetracyclines. Performed at [facility], [address], [place], [place] [address]    Report Status PENDING  Incomplete    Procedures/Studies: No results found.   Time coordinating discharge: Over 30 minutes    Lewie Chamber, MD  [facility] 04/09/2020, 2:05 PM

## 2020-04-09 NOTE — Progress Notes (Signed)
PHARMACY NOTE:  ANTIMICROBIAL RENAL DOSAGE ADJUSTMENT  Current antimicrobial regimen includes a mismatch between antimicrobial dosage and estimated renal function.  As per policy approved by the Pharmacy & Therapeutics and Medical Executive Committees, the antimicrobial dosage will be adjusted accordingly.  Current antimicrobial dosage:  Unasyn 3g IV q12h  Indication: dog bite  Renal Function:  Estimated Creatinine Clearance: 31.4 mL/min (by C-G formula based on SCr of 0.88 mg/dL). []      On intermittent HD, scheduled: []      On CRRT    Antimicrobial dosage has been changed to:  3g IV q8h  Additional comments:   Thank you for allowing pharmacy to be a part of this patient's care.  , PharmD, BCPS Pharmacy: [phone] 04/09/2020 10:22 AM

## 2020-04-09 NOTE — TOC Progression Note (Signed)
Transition of Care [facility]) - Progression Note    Patient Details  Name: Laurie Ward MRN: [id number] Date of Birth: October 06, 1926  Transition of Care [facility]) CM/SW Contact  Coralyn H[place], [place] Phone Number: 04/09/2020, 11:41 AM  Clinical Narrative:   LCSW received call from floor RN about concerns with patient and spouse needing additional support and concerns with patient pharmacy closed today. TOC addressed concerns on Friday and a referral has been made to [facility] for patient. RNCM addressed antibiotic issues and suggest that patient gets second dose in hospital before dcing and pick up script tomorrow. Patient to dc home with home health. TOC signing off.     Expected Discharge Plan: Home w Home Health Services Barriers to Discharge: Continued Medical Work up  Expected Discharge Plan and Services Expected Discharge Plan: Home w Home Health Services In-house Referral: Clinical Social Work, [facility]   Post Acute Care Choice: Home Health Living arrangements for the past 2 months: Single Family Home                 DME Arranged: N/A DME Agency: NA       HH Arranged: RN HH AgencyHotel manager Home Health Care Date HH Agency Contacted: 04/07/20 Time HH Agency Contacted: 1548 Representative spoke with at [facility] Agency: Kandee Keen   Social Determinants of Health (SDOH) Interventions    Readmission Risk Interventions Readmission Risk Prevention Plan 04/07/2020  Post Dischage Appt Complete  Medication Screening Complete  Transportation Screening Complete  Some recent data might be hidden

## 2020-04-09 NOTE — Progress Notes (Signed)
Assessment unchanged. Husband verbalized understanding of medications, wound care, and follow up care. Dressing change done to rt hand as order direscted prior to pt leaving. Family to assist husband at home getting pt settled. Discharged via wc to front entrance accompanied by husband and NT.

## 2020-04-09 NOTE — Progress Notes (Signed)
Dr Frederick Peers aware of IV loss. Pt did not receive intravenous antibiotic, had just started and IV began to leak. MD to order PO dose and discharge pt.

## 2020-04-10 ENCOUNTER — Other Ambulatory Visit: Payer: Self-pay | Admitting: *Deleted

## 2020-04-10 NOTE — Patient Outreach (Addendum)
[facility]) Care Management  04/10/2020  Laurie Ward Dec 12, 1926 [id number]   COVERING Rowe Pavy, RN  Referral received: 04/10/2020 Initial outreach:  04/10/2020 Discharge Date 04/09/2020 [name]: Cellulitis Provider office to completed Transition of Care  Initial unsuccessful telephone call to patient's preferred number; no answer and unable to to leave a HIPAA compliant voicemail message.  Will scheduled another outreach call over the next week for pending services. Will send outreach letter accordingly.  Elliot Cousin, RN Care Management Coordinator [facility] Main Office [phone]

## 2020-04-11 ENCOUNTER — Telehealth: Payer: Self-pay

## 2020-04-11 LAB — CULTURE, BLOOD (ROUTINE X 2)
Culture: NO GROWTH
Culture: NO GROWTH
Special Requests: ADEQUATE
Special Requests: ADEQUATE

## 2020-04-11 LAB — AEROBIC/ANAEROBIC CULTURE W GRAM STAIN (SURGICAL/DEEP WOUND)

## 2020-04-11 NOTE — Telephone Encounter (Signed)
I called the pt. Because she was recently in the hospital for dog bite and her medications were gone over and reconciled. I got her scheduled for a hospital f/u on 04/18/20 at 11:30.

## 2020-04-13 ENCOUNTER — Other Ambulatory Visit: Payer: Self-pay | Admitting: *Deleted

## 2020-04-13 NOTE — Patient Outreach (Addendum)
[facility]) Care Management  04/13/2020  Laurie Ward 11-14-1926 [id number]   Telephone Assessment Hospital Discharge 04/09/2020 Transition of care to be completed by the provider's office  Outreach #2 RN attempted outreach call to pt's son Virl Diamond) due to her Dementia /Alzheimer's however unsuccessful and unable to leave a message.   Will continue outreach attempts for pending [facility] services. Will rescheduled another outreach call over the next week.  Elliot Cousin, RN Care Management Coordinator [facility] Main Office [phone]

## 2020-04-18 ENCOUNTER — Encounter: Payer: Self-pay | Admitting: Family Medicine

## 2020-04-18 ENCOUNTER — Other Ambulatory Visit: Payer: Self-pay

## 2020-04-18 ENCOUNTER — Ambulatory Visit (INDEPENDENT_AMBULATORY_CARE_PROVIDER_SITE_OTHER): Payer: Medicare Other | Admitting: Family Medicine

## 2020-04-18 VITALS — BP 130/72 | HR 62 | Temp 97.7°F | Wt 125.8 lb

## 2020-04-18 DIAGNOSIS — G3184 Mild cognitive impairment, so stated: Secondary | ICD-10-CM

## 2020-04-18 DIAGNOSIS — L03818 Cellulitis of other sites: Secondary | ICD-10-CM

## 2020-04-18 NOTE — Progress Notes (Signed)
   Subjective:    Patient ID: Laurie Ward, female    DOB: 1926/11/04, 86 y.o.   MRN: [id number]  HPI She sustained a dog bite to her right hand October 11 and was seen in an urgent care center on the 12th.  She was given antibiotics but apparently did not take them.  She was seen again on the 14th and sent to the hospital.  She was admitted and taken to the operating room for cleaning of the wound.  She was placed on Augmentin and sent home.  A referral was made to [facility] because of her cognitive status.  Apparently [facility] made an effort to reach house but was turned away.  They did give her a Tdap while in the hospital.  Her daughter is here with her.  There is concern over the fact that neither she nor the husband can adequately take care of them.   Review of Systems     Objective:   Physical Exam Alert and in no distress.  Exam of the right hand does show a healing lesion with some swelling but no erythema or tenderness to palpation.  Normal motion of the fingers.       Assessment & Plan:  Mild cognitive impairment - Plan: AMB Referral to [facility] Care Management  Cellulitis of other specified site The wound seems to be healing well no further intervention at this point is needed however if the swelling and erythema gets worse, the daughter will let me know.  The majority of the time was spent discussing proper care for her parents.  We will again attempt to get home health to come by and visit to assess their needs and what kind of care is available.  I think at the very minimum they are going to need a sitter or someone to come by on a daily basis to make sure things are taken care of. Over 30 minutes spent in evaluation and coordination of care.  We did have the daughter get a form filled out and we will need to keep in touch with her concerning their care since the husband is hard of hearing.

## 2020-04-19 ENCOUNTER — Other Ambulatory Visit: Payer: Self-pay | Admitting: *Deleted

## 2020-04-19 NOTE — Patient Outreach (Signed)
[facility]) Care Management  04/19/2020  Laurie Ward [birth year] [id number]   Telephone Assessment-Unsuccessful  Outreach #3 Daughter contacted the [facility] office and requested calls to there provided contact number [phone]. RN initiated a call today to this number that was unsuccessful. RN able to leave a HIPAA message requesting a call back.   Will follow up once again in a few weeks with the 4th outreach pending services for [facility].  Elliot Cousin, RN Care Management Coordinator [facility] Main Office [phone]

## 2020-05-22 ENCOUNTER — Other Ambulatory Visit: Payer: Self-pay | Admitting: *Deleted

## 2020-05-22 NOTE — Patient Outreach (Signed)
[facility]) Care Management  05/22/2020  Laurie Ward 09-Mar-1927 [id number]   Telephone Assessment-Closure   Outreach #4 RN attempted last outreach call. Spoke with both the pt and her spouse however both confused and the spouse is HOH. Attempted to contact both sons noted in Epic however unsuccessful and unable to leave a HIPAA voice message. No response to the outreach letter.   Based upon no response and unsuccessful outreach calls to all noted contacts will close this case.  Elliot Cousin, RN Care Management Coordinator [facility] Main Office [phone]

## 2020-05-30 ENCOUNTER — Other Ambulatory Visit: Payer: Self-pay

## 2020-05-30 ENCOUNTER — Encounter: Payer: Self-pay | Admitting: Family Medicine

## 2020-05-30 ENCOUNTER — Ambulatory Visit (INDEPENDENT_AMBULATORY_CARE_PROVIDER_SITE_OTHER): Payer: Medicare Other | Admitting: Family Medicine

## 2020-05-30 VITALS — BP 122/72 | HR 71 | Temp 97.9°F | Wt 125.6 lb

## 2020-05-30 DIAGNOSIS — W540XXA Bitten by dog, initial encounter: Secondary | ICD-10-CM | POA: Diagnosis not present

## 2020-05-30 DIAGNOSIS — S61451A Open bite of right hand, initial encounter: Secondary | ICD-10-CM | POA: Diagnosis not present

## 2020-05-30 NOTE — Progress Notes (Signed)
   Subjective:    Patient ID: Laurie Ward, female    DOB: [birth year], 86 y.o.   MRN: [id number]  HPI She was apparently bitten by her dog yesterday on her right hand and is here for evaluation of it.   Review of Systems     Objective:   Physical Exam Exam of the right hand does show several healing lacerations with slight erythema but is not warm hot or tender.  Her immunizations are up-to-date    Assessment & Plan:  Dog bite of right hand, initial encounter At this point no further intervention necessary as there is no evidence of need for laceration repair or antibiotic.  Did recommend that if it does get hot red or tender, they are to call back.  A picture was taken with their flip phone so hopefully it went through.

## 2020-09-15 ENCOUNTER — Telehealth: Payer: Self-pay

## 2020-09-15 NOTE — Telephone Encounter (Signed)
Tried to call pt son to schedule a office visit to complete her forms that came from [place]. No answer and line was busy. KH

## 2020-10-18 ENCOUNTER — Telehealth: Payer: Self-pay | Admitting: Family Medicine

## 2020-10-18 ENCOUNTER — Encounter: Payer: Self-pay | Admitting: Family Medicine

## 2020-10-18 NOTE — Telephone Encounter (Signed)
We received Social Security form wanting to know if pt could manage monies herself.  Pt needs appt. Called every number we have for this patient and could not reach her.  The mobile number is a wrong number now. The emergency contacts numbers were fast busy. Sending letter to patient requesting she call.

## 2020-10-31 ENCOUNTER — Encounter: Payer: Self-pay | Admitting: Family Medicine

## 2020-10-31 ENCOUNTER — Other Ambulatory Visit: Payer: Self-pay

## 2020-10-31 ENCOUNTER — Ambulatory Visit (INDEPENDENT_AMBULATORY_CARE_PROVIDER_SITE_OTHER): Payer: Medicare Other | Admitting: Family Medicine

## 2020-10-31 VITALS — BP 132/74 | HR 60 | Temp 97.3°F | Ht <= 58 in | Wt 123.8 lb

## 2020-10-31 DIAGNOSIS — F0280 Dementia in other diseases classified elsewhere without behavioral disturbance: Secondary | ICD-10-CM

## 2020-10-31 DIAGNOSIS — G309 Alzheimer's disease, unspecified: Secondary | ICD-10-CM | POA: Diagnosis not present

## 2020-10-31 NOTE — Progress Notes (Signed)
   Subjective:    Patient ID: Laurie Ward, female    DOB: 1926-10-07, 87 y.o.   MRN: [id number]  HPI She is here with her daughter.  Her daughter is trying to fill out paperwork so she can have more control of her Social Security and potentially get her mother on [place].  Laurie Ward is not oriented x3 did not know her daughter's name.  Her daughter also states that she has been wandering and they have had to place locks on the doors to keep her from being outside.  They have had to call the police at least on 1 occasion because of this   Review of Systems     Objective:   Physical Exam Alert and pleasant but unable to respond to person place and time.  When asked who the lady was sitting by her, she did not get a response.       Assessment & Plan:   Alzheimer's dementia without behavioral disturbance, unspecified timing of dementia onset (HCC) - Plan: Ambulatory referral to Home Health It is very obvious that she is not able to take care of her needs.  Recommended to her daughter to get an Education officer, community involved.  We will also have home health come out and help with doing an assessment of their needs.  I think the husband will also need to be helped with this.

## 2020-11-24 ENCOUNTER — Other Ambulatory Visit: Payer: Self-pay | Admitting: Family Medicine

## 2020-11-24 DIAGNOSIS — L0390 Cellulitis, unspecified: Secondary | ICD-10-CM | POA: Diagnosis not present

## 2020-11-24 DIAGNOSIS — I10 Essential (primary) hypertension: Secondary | ICD-10-CM

## 2020-11-24 DIAGNOSIS — M65031 Abscess of tendon sheath, right forearm: Secondary | ICD-10-CM | POA: Diagnosis not present

## 2020-11-26 DIAGNOSIS — L03113 Cellulitis of right upper limb: Secondary | ICD-10-CM | POA: Diagnosis not present

## 2021-03-19 ENCOUNTER — Telehealth: Payer: Self-pay | Admitting: *Deleted

## 2021-03-19 NOTE — Telephone Encounter (Signed)
Revonda Standard with [facility] called. Ms. Backs is interested in pallative care (not hospice care) and they were calling for a verbal order. Her number is [phone].

## 2021-03-19 NOTE — Telephone Encounter (Signed)
Verbal orders given  

## 2021-03-28 ENCOUNTER — Other Ambulatory Visit: Payer: Self-pay

## 2021-03-28 ENCOUNTER — Telehealth: Payer: Self-pay

## 2021-03-28 DIAGNOSIS — G309 Alzheimer's disease, unspecified: Secondary | ICD-10-CM

## 2021-03-28 DIAGNOSIS — G3184 Mild cognitive impairment, so stated: Secondary | ICD-10-CM

## 2021-03-28 DIAGNOSIS — J449 Chronic obstructive pulmonary disease, unspecified: Secondary | ICD-10-CM

## 2021-03-28 DIAGNOSIS — M810 Age-related osteoporosis without current pathological fracture: Secondary | ICD-10-CM

## 2021-03-28 DIAGNOSIS — F0280 Dementia in other diseases classified elsewhere without behavioral disturbance: Secondary | ICD-10-CM

## 2021-03-28 NOTE — Telephone Encounter (Signed)
[facility] . Palliative care is not a good fit for pt . Could we put I a referral to [facility]  mang. To see what they can do [facility]

## 2021-03-28 NOTE — Telephone Encounter (Signed)
Done KH 

## 2021-09-17 ENCOUNTER — Other Ambulatory Visit: Payer: Self-pay | Admitting: Family Medicine

## 2021-09-17 DIAGNOSIS — I10 Essential (primary) hypertension: Secondary | ICD-10-CM

## 2021-11-02 ENCOUNTER — Ambulatory Visit (INDEPENDENT_AMBULATORY_CARE_PROVIDER_SITE_OTHER): Payer: Medicare Other | Admitting: Family Medicine

## 2021-11-02 ENCOUNTER — Encounter: Payer: Self-pay | Admitting: Family Medicine

## 2021-11-02 VITALS — BP 130/76 | HR 61 | Temp 97.0°F

## 2021-11-02 DIAGNOSIS — E039 Hypothyroidism, unspecified: Secondary | ICD-10-CM

## 2021-11-02 DIAGNOSIS — R8281 Pyuria: Secondary | ICD-10-CM

## 2021-11-02 DIAGNOSIS — I10 Essential (primary) hypertension: Secondary | ICD-10-CM | POA: Diagnosis not present

## 2021-11-02 DIAGNOSIS — M810 Age-related osteoporosis without current pathological fracture: Secondary | ICD-10-CM

## 2021-11-02 DIAGNOSIS — Z659 Problem related to unspecified psychosocial circumstances: Secondary | ICD-10-CM

## 2021-11-02 DIAGNOSIS — G309 Alzheimer's disease, unspecified: Secondary | ICD-10-CM | POA: Diagnosis not present

## 2021-11-02 DIAGNOSIS — R296 Repeated falls: Secondary | ICD-10-CM | POA: Diagnosis not present

## 2021-11-02 DIAGNOSIS — F0280 Dementia in other diseases classified elsewhere without behavioral disturbance: Secondary | ICD-10-CM

## 2021-11-02 LAB — POCT URINALYSIS DIP (PROADVANTAGE DEVICE)
Bilirubin, UA: NEGATIVE
Blood, UA: NEGATIVE
Glucose, UA: NEGATIVE mg/dL
Ketones, POC UA: NEGATIVE mg/dL
Nitrite, UA: NEGATIVE
Specific Gravity, Urine: 1.02
Urobilinogen, Ur: 0.2
pH, UA: 6 (ref 5.0–8.0)

## 2021-11-02 MED ORDER — SULFAMETHOXAZOLE-TRIMETHOPRIM 800-160 MG PO TABS: 1.0000 | ORAL_TABLET | Freq: Two times a day (BID) | ORAL | 0 refills | Status: DC

## 2021-11-02 NOTE — Progress Notes (Signed)
Urine dipstick ? ?Subjective:  ? ? Patient ID: Laurie Ward, female    DOB: 09/03/1926, 88 y.o.   MRN: [id number] ? ?HPI ?Her daughter brought her in because of issues with Laurie Ward sleeping a lot and pedal edema.  She also apparently has been falling more than normal.  She lives with her husband who is also having failing health but they seem to be holding her own.  Apparently the children do come over to help out as much as they can.  She does not offer any complaints of fever, chills, cough, congestion.  Apparently there is a previous history of hypothyroid but she is not on medication at the present point.  Review of the record indicates that we did try to refer home health to come by and do an assessment but apparently that did not get done.  She does have a history of osteoporosis. ?Urine dipstick did show white cells. ? ?Review of Systems ? ?   ?Objective:  ? Physical Exam ?Alert and in no distress.  She is disoriented to person place and time.  Cardiac exam shows regular rhythm without murmurs or gallops.  Lungs are clear to auscultation.  Exam of her feet does show 2+ pitting edema. ? ? ? ?   ?Assessment & Plan:  ?Alzheimer's dementia, unspecified dementia severity, unspecified timing of dementia onset, unspecified whether behavioral, psychotic, or mood disturbance or anxiety (HCC) - Plan: CBC with Differential/Platelet, Comprehensive metabolic panel, TSH, POCT Urinalysis DIP (Proadvantage Device) ? ?Osteoporosis, unspecified osteoporosis type, unspecified pathological fracture presence ? ?Essential hypertension - Plan: CBC with Differential/Platelet, Comprehensive metabolic panel ? ?Poor social situation ? ?Acquired hypothyroidism - Plan: TSH ? ?Multiple falls ? ?Pyuria - Plan: sulfamethoxazole-trimethoprim (BACTRIM DS) 800-160 MG tablet ?We will again attempt to get home health to go by and assess the situation.  Blood work will be drawn to get a better idea what is going on but I have a feeling this is  all related to underlying Alzheimer's.  With the underlying osteoporosis and multiple falls she is at risk for fracture. ?I will also place her on an antibiotic for presumed UTI although its not sure.  Her mental status makes it difficult to truly determine what is going on but this could be related to her irritability. ? ?

## 2021-11-03 LAB — CBC WITH DIFFERENTIAL/PLATELET
Basophils Absolute: 0.1 x10E3/uL (ref 0.0–0.2)
Basos: 1 %
EOS (ABSOLUTE): 0.3 x10E3/uL (ref 0.0–0.4)
Eos: 3 %
Hematocrit: 33.9 % — ABNORMAL LOW (ref 34.0–46.6)
Hemoglobin: 11.4 g/dL (ref 11.1–15.9)
Immature Grans (Abs): 0 x10E3/uL (ref 0.0–0.1)
Immature Granulocytes: 0 %
Lymphocytes Absolute: 2.3 x10E3/uL (ref 0.7–3.1)
Lymphs: 25 %
MCH: 29.4 pg (ref 26.6–33.0)
MCHC: 33.6 g/dL (ref 31.5–35.7)
MCV: 87 fL (ref 79–97)
Monocytes Absolute: 0.9 x10E3/uL (ref 0.1–0.9)
Monocytes: 9 %
Neutrophils Absolute: 5.8 x10E3/uL (ref 1.4–7.0)
Neutrophils: 62 %
Platelets: 423 x10E3/uL (ref 150–450)
RBC: 3.88 x10E6/uL (ref 3.77–5.28)
RDW: 13.5 % (ref 11.7–15.4)
WBC: 9.3 x10E3/uL (ref 3.4–10.8)

## 2021-11-03 LAB — COMPREHENSIVE METABOLIC PANEL
ALT: 14 IU/L (ref 0–32)
AST: 19 IU/L (ref 0–40)
Albumin/Globulin Ratio: 1.1 — ABNORMAL LOW (ref 1.2–2.2)
Albumin: 3.8 g/dL (ref 3.5–4.6)
Alkaline Phosphatase: 83 IU/L (ref 44–121)
BUN/Creatinine Ratio: 29 — ABNORMAL HIGH (ref 12–28)
BUN: 41 mg/dL — ABNORMAL HIGH (ref 10–36)
Bilirubin Total: 0.2 mg/dL (ref 0.0–1.2)
CO2: 19 mmol/L — ABNORMAL LOW (ref 20–29)
Calcium: 9.1 mg/dL (ref 8.7–10.3)
Chloride: 105 mmol/L (ref 96–106)
Creatinine, Ser: 1.41 mg/dL — ABNORMAL HIGH (ref 0.57–1.00)
Globulin, Total: 3.4 g/dL (ref 1.5–4.5)
Glucose: 105 mg/dL — ABNORMAL HIGH (ref 70–99)
Potassium: 4.5 mmol/L (ref 3.5–5.2)
Sodium: 140 mmol/L (ref 134–144)
Total Protein: 7.2 g/dL (ref 6.0–8.5)
eGFR: 35 mL/min/{1.73_m2} — ABNORMAL LOW (ref >59)

## 2021-11-03 LAB — TSH: TSH: 23.9 u[IU]/mL — ABNORMAL HIGH (ref 0.450–4.500)

## 2021-11-04 ENCOUNTER — Inpatient Hospital Stay (HOSPITAL_COMMUNITY)
Admission: EM | Admit: 2021-11-04 | Discharge: 2021-11-08 | DRG: 690 | Disposition: A | Discharge status: Hospice | Payer: Medicare Other | Attending: Family Medicine | Admitting: Family Medicine

## 2021-11-04 ENCOUNTER — Encounter (HOSPITAL_COMMUNITY): Payer: Self-pay | Admitting: Emergency Medicine

## 2021-11-04 ENCOUNTER — Emergency Department (HOSPITAL_COMMUNITY): Payer: Medicare Other

## 2021-11-04 DIAGNOSIS — M1990 Unspecified osteoarthritis, unspecified site: Secondary | ICD-10-CM | POA: Diagnosis present

## 2021-11-04 DIAGNOSIS — R296 Repeated falls: Secondary | ICD-10-CM | POA: Diagnosis present

## 2021-11-04 DIAGNOSIS — N3000 Acute cystitis without hematuria: Secondary | ICD-10-CM | POA: Diagnosis not present

## 2021-11-04 DIAGNOSIS — R627 Adult failure to thrive: Secondary | ICD-10-CM | POA: Diagnosis not present

## 2021-11-04 DIAGNOSIS — I129 Hypertensive chronic kidney disease with stage 1 through stage 4 chronic kidney disease, or unspecified chronic kidney disease: Secondary | ICD-10-CM | POA: Diagnosis not present

## 2021-11-04 DIAGNOSIS — E440 Moderate protein-calorie malnutrition: Secondary | ICD-10-CM

## 2021-11-04 DIAGNOSIS — E860 Dehydration: Secondary | ICD-10-CM | POA: Diagnosis not present

## 2021-11-04 DIAGNOSIS — I10 Essential (primary) hypertension: Secondary | ICD-10-CM | POA: Diagnosis present

## 2021-11-04 DIAGNOSIS — F0280 Dementia in other diseases classified elsewhere without behavioral disturbance: Secondary | ICD-10-CM | POA: Diagnosis present

## 2021-11-04 DIAGNOSIS — K219 Gastro-esophageal reflux disease without esophagitis: Secondary | ICD-10-CM | POA: Diagnosis present

## 2021-11-04 DIAGNOSIS — Z79890 Hormone replacement therapy (postmenopausal): Secondary | ICD-10-CM

## 2021-11-04 DIAGNOSIS — Z79899 Other long term (current) drug therapy: Secondary | ICD-10-CM

## 2021-11-04 DIAGNOSIS — R918 Other nonspecific abnormal finding of lung field: Secondary | ICD-10-CM

## 2021-11-04 DIAGNOSIS — W1830XA Fall on same level, unspecified, initial encounter: Secondary | ICD-10-CM | POA: Diagnosis present

## 2021-11-04 DIAGNOSIS — R531 Weakness: Secondary | ICD-10-CM | POA: Diagnosis not present

## 2021-11-04 DIAGNOSIS — Z681 Body mass index (BMI) 19 or less, adult: Secondary | ICD-10-CM

## 2021-11-04 DIAGNOSIS — E039 Hypothyroidism, unspecified: Secondary | ICD-10-CM | POA: Diagnosis not present

## 2021-11-04 DIAGNOSIS — Z90710 Acquired absence of both cervix and uterus: Secondary | ICD-10-CM

## 2021-11-04 DIAGNOSIS — Z66 Do not resuscitate: Secondary | ICD-10-CM | POA: Diagnosis not present

## 2021-11-04 DIAGNOSIS — Z20822 Contact with and (suspected) exposure to covid-19: Secondary | ICD-10-CM | POA: Diagnosis present

## 2021-11-04 DIAGNOSIS — Z9049 Acquired absence of other specified parts of digestive tract: Secondary | ICD-10-CM | POA: Diagnosis not present

## 2021-11-04 DIAGNOSIS — D631 Anemia in chronic kidney disease: Secondary | ICD-10-CM | POA: Diagnosis present

## 2021-11-04 DIAGNOSIS — J449 Chronic obstructive pulmonary disease, unspecified: Secondary | ICD-10-CM | POA: Diagnosis not present

## 2021-11-04 DIAGNOSIS — F02B18 Dementia in other diseases classified elsewhere, moderate, with other behavioral disturbance: Secondary | ICD-10-CM | POA: Diagnosis not present

## 2021-11-04 DIAGNOSIS — Z515 Encounter for palliative care: Secondary | ICD-10-CM | POA: Diagnosis not present

## 2021-11-04 DIAGNOSIS — R109 Unspecified abdominal pain: Secondary | ICD-10-CM | POA: Diagnosis not present

## 2021-11-04 DIAGNOSIS — Z9181 History of falling: Secondary | ICD-10-CM

## 2021-11-04 DIAGNOSIS — M810 Age-related osteoporosis without current pathological fracture: Secondary | ICD-10-CM | POA: Diagnosis present

## 2021-11-04 DIAGNOSIS — Z87891 Personal history of nicotine dependence: Secondary | ICD-10-CM

## 2021-11-04 DIAGNOSIS — E7800 Pure hypercholesterolemia, unspecified: Secondary | ICD-10-CM | POA: Diagnosis not present

## 2021-11-04 DIAGNOSIS — N179 Acute kidney failure, unspecified: Secondary | ICD-10-CM | POA: Diagnosis not present

## 2021-11-04 DIAGNOSIS — N1832 Chronic kidney disease, stage 3b: Secondary | ICD-10-CM | POA: Diagnosis present

## 2021-11-04 DIAGNOSIS — R079 Chest pain, unspecified: Secondary | ICD-10-CM | POA: Diagnosis not present

## 2021-11-04 DIAGNOSIS — G319 Degenerative disease of nervous system, unspecified: Secondary | ICD-10-CM | POA: Diagnosis not present

## 2021-11-04 DIAGNOSIS — D649 Anemia, unspecified: Secondary | ICD-10-CM

## 2021-11-04 DIAGNOSIS — G309 Alzheimer's disease, unspecified: Secondary | ICD-10-CM | POA: Diagnosis not present

## 2021-11-04 DIAGNOSIS — Z881 Allergy status to other antibiotic agents status: Secondary | ICD-10-CM

## 2021-11-04 LAB — COMPREHENSIVE METABOLIC PANEL
ALT: 17 U/L (ref 0–44)
AST: 22 U/L (ref 15–41)
Albumin: 3.6 g/dL (ref 3.5–5.0)
Alkaline Phosphatase: 71 U/L (ref 38–126)
Anion gap: 7 (ref 5–15)
BUN: 49 mg/dL — ABNORMAL HIGH (ref 8–23)
CO2: 21 mmol/L — ABNORMAL LOW (ref 22–32)
Calcium: 8.9 mg/dL (ref 8.9–10.3)
Chloride: 108 mmol/L (ref 98–111)
Creatinine, Ser: 1.44 mg/dL — ABNORMAL HIGH (ref 0.44–1.00)
GFR, Estimated: 34 mL/min — ABNORMAL LOW (ref >60)
Glucose, Bld: 137 mg/dL — ABNORMAL HIGH (ref 70–99)
Potassium: 4.3 mmol/L (ref 3.5–5.1)
Sodium: 136 mmol/L (ref 135–145)
Total Bilirubin: 0.8 mg/dL (ref 0.3–1.2)
Total Protein: 7.8 g/dL (ref 6.5–8.1)

## 2021-11-04 LAB — CBC WITH DIFFERENTIAL/PLATELET
Abs Immature Granulocytes: 0.12 10*3/uL — ABNORMAL HIGH (ref 0.00–0.07)
Basophils Absolute: 0.1 10*3/uL (ref 0.0–0.1)
Basophils Relative: 0 %
Eosinophils Absolute: 0 10*3/uL (ref 0.0–0.5)
Eosinophils Relative: 0 %
HCT: 34.1 % — ABNORMAL LOW (ref 36.0–46.0)
Hemoglobin: 11.5 g/dL — ABNORMAL LOW (ref 12.0–15.0)
Immature Granulocytes: 1 %
Lymphocytes Relative: 8 %
Lymphs Abs: 1.3 10*3/uL (ref 0.7–4.0)
MCH: 30.7 pg (ref 26.0–34.0)
MCHC: 33.7 g/dL (ref 30.0–36.0)
MCV: 91.2 fL (ref 80.0–100.0)
Monocytes Absolute: 0.6 10*3/uL (ref 0.1–1.0)
Monocytes Relative: 4 %
Neutro Abs: 13.8 10*3/uL — ABNORMAL HIGH (ref 1.7–7.7)
Neutrophils Relative %: 87 %
Platelets: 399 10*3/uL (ref 150–400)
RBC: 3.74 MIL/uL — ABNORMAL LOW (ref 3.87–5.11)
RDW: 13.7 % (ref 11.5–15.5)
WBC: 15.9 10*3/uL — ABNORMAL HIGH (ref 4.0–10.5)
nRBC: 0 % (ref 0.0–0.2)

## 2021-11-04 LAB — URINALYSIS, ROUTINE W REFLEX MICROSCOPIC
Bilirubin Urine: NEGATIVE
Glucose, UA: NEGATIVE mg/dL
Hgb urine dipstick: NEGATIVE
Ketones, ur: NEGATIVE mg/dL
Nitrite: NEGATIVE
Protein, ur: 30 mg/dL — AB
Specific Gravity, Urine: 1.014 (ref 1.005–1.030)
WBC, UA: >50 WBC/hpf — ABNORMAL HIGH (ref 0–5)
pH: 5 (ref 5.0–8.0)

## 2021-11-04 LAB — RESP PANEL BY RT-PCR (FLU A&B, COVID) ARPGX2
Influenza A by PCR: NEGATIVE
Influenza B by PCR: NEGATIVE
SARS Coronavirus 2 by RT PCR: NEGATIVE

## 2021-11-04 MED ORDER — LEVOTHYROXINE SODIUM 50 MCG PO TABS: 50.0000 ug | ORAL_TABLET | Freq: Every day | ORAL | 3 refills | Status: DC

## 2021-11-04 MED ORDER — LEVOTHYROXINE SODIUM 75 MCG PO TABS: 75.0000 ug | ORAL_TABLET | Freq: Every day | ORAL | 3 refills | Status: DC

## 2021-11-04 NOTE — ED Provider Notes (Signed)
[facility]Provider Note ? ? ?CSN: [id number] ?Arrival date & time: 11/04/21  2011 ? ?  ? ?History ? ?Chief Complaint  ?Patient presents with  ? Fall  ? ? ?Laurie Ward is a 88 y.o. female with a pmh of advanced alzheimer's dementia, hypothyroidism, htn , hld, COPD, osteoporosis brought in by her daughter for weakness and falls.  Patient has had some chronic decline however over the past week the patient has gone from being able to walk by herself to being too weak to get up.  She has been sleeping for most of the day over the past 4 or 5 days.  Her husband reported to the daughter that she has not even gotten up to eat or drink.  Her daughter brought her to her PCP 2 days ago who ran lab tests which were pending at the time, noted she had some pyuria and began treating her with Bactrim.  Patient's daughter is at bedside and states that her dad has not been giving her some of her medication and was told that she could discontinue taking her thyroid medication and her cholesterol medications.  She is unsure of how long she has been off of her medications.  Patient's daughter has noted that she has had some swelling in her feet and shins that has been going on now for several weeks which is new.  She has been had 3 falls today and earlier was complaining that her back was hurting and her daughter was having difficulty getting her in the car.  Review of EMR shows that the patient has a slight AKI compared to the last value she had with an elevated creatinine as well as a TSH level that is unreasonably high. ? ? ?Fall ? ? ?  ? ?Home Medications ?Prior to Admission medications   ?Medication Sig Start Date End Date Taking? Authorizing Provider  ?acetaminophen (TYLENOL) 500 MG tablet Take 500 mg by mouth every 6 (six) hours as needed for mild pain.    Yes [provider]  ?lisinopril-hydrochlorothiazide (ZESTORETIC) 20-12.5 MG tablet TAKE ONE (1) TABLET BY MOUTH  DAILY ?Patient taking differently: Take 1 tablet by mouth daily. 09/17/21  Yes Ronnald Nian, MD  ?Multiple Vitamins-Minerals (PRESERVISION AREDS 2+MULTI VIT PO) Take 1 tablet by mouth daily.   Yes [provider]  ?levothyroxine (SYNTHROID) 50 MCG tablet Take 1 tablet (50 mcg total) by mouth daily. 11/04/21   Ronnald Nian, MD  ?levothyroxine (SYNTHROID) 75 MCG tablet Take 1 tablet (75 mcg total) by mouth daily. 12/05/21   Ronnald Nian, MD  ?sulfamethoxazole-trimethoprim (BACTRIM DS) 800-160 MG tablet Take 1 tablet by mouth 2 (two) times daily. ?Patient not taking: Reported on 11/04/2021 11/02/21   Ronnald Nian, MD  ?   ? ?Allergies    ?Levaquin [levofloxacin in d5w]   ? ?Review of Systems   ?Review of Systems ? ?Physical Exam ?Updated Vital Signs ?BP (!) 135/54 (BP Location: Left Arm)   Pulse 63   Temp 97.7 ?F (36.5 ?C) (Oral)   Resp 17   SpO2 98%  ?Physical Exam ?Vitals and nursing note reviewed.  ?Constitutional:   ?   General: She is not in acute distress. ?   Appearance: She is well-developed. She is not diaphoretic.  ?HENT:  ?   Head: Normocephalic and atraumatic.  ?   Right Ear: External ear normal.  ?   Left Ear: External ear normal.  ?   Nose: Nose  normal.  ?   Mouth/Throat:  ?   Mouth: Mucous membranes are moist.  ?Eyes:  ?   General: No scleral icterus. ?   Conjunctiva/sclera: Conjunctivae normal.  ?Cardiovascular:  ?   Rate and Rhythm: Normal rate and regular rhythm.  ?   Heart sounds: Normal heart sounds. No murmur heard. ?  No friction rub. No gallop.  ?Pulmonary:  ?   Effort: Pulmonary effort is normal. No respiratory distress.  ?   Breath sounds: Normal breath sounds.  ?Abdominal:  ?   General: Bowel sounds are normal. There is no distension.  ?   Palpations: Abdomen is soft. There is no mass.  ?   Tenderness: There is no abdominal tenderness. There is no guarding.  ?Musculoskeletal:  ?   Cervical back: Normal range of motion.  ?   Comments: Moves  extremities without obvious pain    ?Skin: ?   General: Skin is warm and dry.  ?Neurological:  ?   Mental Status: She is oriented to person, place, and time. Mental status is at baseline. She is lethargic.  ?Psychiatric:     ?   Behavior: Behavior normal.  ? ? ?ED Results / Procedures / Treatments   ?Labs ?(all labs ordered are listed, but only abnormal results are displayed) ?Labs Reviewed  ?CBC WITH DIFFERENTIAL/PLATELET - Abnormal; Notable for the following components:  ?    Result Value  ? WBC 15.9 (*)   ? RBC 3.74 (*)   ? Hemoglobin 11.5 (*)   ? HCT 34.1 (*)   ? Neutro Abs 13.8 (*)   ? Abs Immature Granulocytes 0.12 (*)   ? All other components within normal limits  ?COMPREHENSIVE METABOLIC PANEL - Abnormal; Notable for the following components:  ? CO2 21 (*)   ? Glucose, Bld 137 (*)   ? BUN 49 (*)   ? Creatinine, Ser 1.44 (*)   ? GFR, Estimated 34 (*)   ? All other components within normal limits  ?URINALYSIS, ROUTINE W REFLEX MICROSCOPIC - Abnormal; Notable for the following components:  ? APPearance TURBID (*)   ? Protein, ur 30 (*)   ? Leukocytes,Ua LARGE (*)   ? WBC, UA >50 (*)   ? Bacteria, UA FEW (*)   ? All other components within normal limits  ?BASIC METABOLIC PANEL - Abnormal; Notable for the following components:  ? CO2 18 (*)   ? Glucose, Bld 102 (*)   ? BUN 42 (*)   ? Creatinine, Ser 1.37 (*)   ? Calcium 8.5 (*)   ? GFR, Estimated 36 (*)   ? All other components within normal limits  ?CBC - Abnormal; Notable for the following components:  ? WBC 13.6 (*)   ? RBC 3.77 (*)   ? Hemoglobin 11.0 (*)   ? HCT 34.8 (*)   ? Platelets 410 (*)   ? All other components within normal limits  ?BASIC METABOLIC PANEL - Abnormal; Notable for the following components:  ? Chloride 113 (*)   ? CO2 21 (*)   ? BUN 39 (*)   ? Creatinine, Ser 1.39 (*)   ? Calcium 8.6 (*)   ? GFR, Estimated 36 (*)   ? All other components within normal limits  ?RESP PANEL BY RT-PCR (FLU A&B, COVID) ARPGX2  ?T4, FREE  ?T3, FREE  ? ? ?EKG ?None ? ?Radiology ?CT HEAD WO  CONTRAST ? ?Result Date: 11/04/2021 ?CLINICAL DATA:  Multiple falls, dementia, hypertension EXAM: CT HEAD WITHOUT  CONTRAST CT CERVICAL SPINE WITHOUT CONTRAST TECHNIQUE: Multidetector CT imaging of the head and cervical spine was performed following the standard protocol without intravenous contrast. Multiplanar CT image reconstructions of the cervical spine were also generated. RADIATION DOSE REDUCTION: This exam was performed according to the departmental dose-optimization program which includes automated exposure control, adjustment of the mA and/or kV according to patient size and/or use of iterative reconstruction technique. COMPARISON:  None Available. FINDINGS: CT HEAD FINDINGS Brain: No evidence of acute infarction, hemorrhage, hydrocephalus, extra-axial collection or mass lesion/mass effect. Age related atrophy. Subcortical white matter and periventricular small vessel ischemic changes. Vascular: No hyperdense vessel or unexpected calcification. Skull: Normal. Negative for fracture or focal lesion. Sinuses/Orbits: The visualized paranasal sinuses are essentially clear. The mastoid air cells are unopacified. Other: None. CT CERVICAL SPINE FINDINGS Alignment: Exaggerated upper cervical lordosis. Skull base and vertebrae: No acute fracture. No primary bone lesion or focal pathologic process. Soft tissues and spinal canal: No prevertebral fluid or swelling. No visible canal hematoma. Disc levels: Intervertebral disc spaces are maintained. Spinal canal is patent. Upper chest: Pertinent right upper lobe lung findings are incompletely visualized but evaluated on dedicated CT chest. Other: None. IMPRESSION: No evidence of acute intracranial abnormality. Atrophy with small vessel ischemic changes. No evidence of traumatic injury to the cervical spine. Pertinent right upper lobe lung findings evaluated on dedicated CT chest. Electronically Signed   By: Charline Bills M.D.   On: 11/04/2021 21:35  ? ?CT CERVICAL SPINE  WO CONTRAST ? ?Result Date: 11/04/2021 ?CLINICAL DATA:  Multiple falls, dementia, hypertension EXAM: CT HEAD WITHOUT CONTRAST CT CERVICAL SPINE WITHOUT CONTRAST TECHNIQUE: Multidetector CT imaging of the head and cervical

## 2021-11-04 NOTE — ED Triage Notes (Signed)
Pt BIB daughter. She has dementia and high blood pressure. She has fallen 3 times today and is very weak per daughter. Denies known injury from the fall, but daughter states that she is not able to stay at home because her husband can no longer care for her.  ?

## 2021-11-04 NOTE — ED Provider Notes (Signed)
I provided a substantive portion of the care of this patient.  I personally performed the entirety of the medical decision making for this encounter. ? ? ? ?88 year old female presents with increasing falls and weakness from home.  Has history of dementia.  Discussed with daughter states that she has had decreased oral intake.  Patient's work-up here is significant for worsening dehydration as well as leukocytosis.  Patient reportedly has had a very high TSH and concern for possible ischemia.  Will be admitted to the hospital ?  ?Lacretia Leigh, MD ?11/04/21 2245 ? ?

## 2021-11-04 NOTE — Addendum Note (Signed)
Addended by: Ronnald Nian on: 11/04/2021 01:07 PM ? ? Modules accepted: Orders ? ?

## 2021-11-05 ENCOUNTER — Encounter (HOSPITAL_COMMUNITY): Payer: Self-pay | Admitting: Internal Medicine

## 2021-11-05 DIAGNOSIS — R531 Weakness: Secondary | ICD-10-CM

## 2021-11-05 DIAGNOSIS — I129 Hypertensive chronic kidney disease with stage 1 through stage 4 chronic kidney disease, or unspecified chronic kidney disease: Secondary | ICD-10-CM | POA: Diagnosis present

## 2021-11-05 DIAGNOSIS — Z20822 Contact with and (suspected) exposure to covid-19: Secondary | ICD-10-CM | POA: Diagnosis not present

## 2021-11-05 DIAGNOSIS — W1830XA Fall on same level, unspecified, initial encounter: Secondary | ICD-10-CM | POA: Diagnosis present

## 2021-11-05 DIAGNOSIS — Z9049 Acquired absence of other specified parts of digestive tract: Secondary | ICD-10-CM | POA: Diagnosis not present

## 2021-11-05 DIAGNOSIS — R918 Other nonspecific abnormal finding of lung field: Secondary | ICD-10-CM

## 2021-11-05 DIAGNOSIS — E039 Hypothyroidism, unspecified: Secondary | ICD-10-CM | POA: Diagnosis not present

## 2021-11-05 DIAGNOSIS — G309 Alzheimer's disease, unspecified: Secondary | ICD-10-CM

## 2021-11-05 DIAGNOSIS — N1832 Chronic kidney disease, stage 3b: Secondary | ICD-10-CM | POA: Diagnosis not present

## 2021-11-05 DIAGNOSIS — Z681 Body mass index (BMI) 19 or less, adult: Secondary | ICD-10-CM | POA: Diagnosis not present

## 2021-11-05 DIAGNOSIS — R296 Repeated falls: Secondary | ICD-10-CM | POA: Diagnosis present

## 2021-11-05 DIAGNOSIS — N179 Acute kidney failure, unspecified: Secondary | ICD-10-CM | POA: Diagnosis present

## 2021-11-05 DIAGNOSIS — E440 Moderate protein-calorie malnutrition: Secondary | ICD-10-CM | POA: Diagnosis not present

## 2021-11-05 DIAGNOSIS — Z515 Encounter for palliative care: Secondary | ICD-10-CM

## 2021-11-05 DIAGNOSIS — Z66 Do not resuscitate: Secondary | ICD-10-CM | POA: Diagnosis not present

## 2021-11-05 DIAGNOSIS — R627 Adult failure to thrive: Secondary | ICD-10-CM | POA: Diagnosis not present

## 2021-11-05 DIAGNOSIS — D631 Anemia in chronic kidney disease: Secondary | ICD-10-CM | POA: Diagnosis not present

## 2021-11-05 DIAGNOSIS — E7800 Pure hypercholesterolemia, unspecified: Secondary | ICD-10-CM | POA: Diagnosis present

## 2021-11-05 DIAGNOSIS — N3000 Acute cystitis without hematuria: Secondary | ICD-10-CM | POA: Diagnosis not present

## 2021-11-05 DIAGNOSIS — F02B18 Dementia in other diseases classified elsewhere, moderate, with other behavioral disturbance: Secondary | ICD-10-CM

## 2021-11-05 DIAGNOSIS — E860 Dehydration: Secondary | ICD-10-CM | POA: Diagnosis not present

## 2021-11-05 DIAGNOSIS — J449 Chronic obstructive pulmonary disease, unspecified: Secondary | ICD-10-CM

## 2021-11-05 DIAGNOSIS — K219 Gastro-esophageal reflux disease without esophagitis: Secondary | ICD-10-CM | POA: Diagnosis present

## 2021-11-05 DIAGNOSIS — M810 Age-related osteoporosis without current pathological fracture: Secondary | ICD-10-CM | POA: Diagnosis present

## 2021-11-05 DIAGNOSIS — M1990 Unspecified osteoarthritis, unspecified site: Secondary | ICD-10-CM | POA: Diagnosis present

## 2021-11-05 DIAGNOSIS — Z9181 History of falling: Secondary | ICD-10-CM | POA: Diagnosis not present

## 2021-11-05 DIAGNOSIS — F0280 Dementia in other diseases classified elsewhere without behavioral disturbance: Secondary | ICD-10-CM | POA: Diagnosis present

## 2021-11-05 DIAGNOSIS — Z79890 Hormone replacement therapy (postmenopausal): Secondary | ICD-10-CM | POA: Diagnosis not present

## 2021-11-05 LAB — CBC
HCT: 34.8 % — ABNORMAL LOW (ref 36.0–46.0)
Hemoglobin: 11 g/dL — ABNORMAL LOW (ref 12.0–15.0)
MCH: 29.2 pg (ref 26.0–34.0)
MCHC: 31.6 g/dL (ref 30.0–36.0)
MCV: 92.3 fL (ref 80.0–100.0)
Platelets: 410 10*3/uL — ABNORMAL HIGH (ref 150–400)
RBC: 3.77 MIL/uL — ABNORMAL LOW (ref 3.87–5.11)
RDW: 13.6 % (ref 11.5–15.5)
WBC: 13.6 10*3/uL — ABNORMAL HIGH (ref 4.0–10.5)
nRBC: 0 % (ref 0.0–0.2)

## 2021-11-05 LAB — BASIC METABOLIC PANEL
Anion gap: 10 (ref 5–15)
BUN: 42 mg/dL — ABNORMAL HIGH (ref 8–23)
CO2: 18 mmol/L — ABNORMAL LOW (ref 22–32)
Calcium: 8.5 mg/dL — ABNORMAL LOW (ref 8.9–10.3)
Chloride: 110 mmol/L (ref 98–111)
Creatinine, Ser: 1.37 mg/dL — ABNORMAL HIGH (ref 0.44–1.00)
GFR, Estimated: 36 mL/min — ABNORMAL LOW (ref >60)
Glucose, Bld: 102 mg/dL — ABNORMAL HIGH (ref 70–99)
Potassium: 4.2 mmol/L (ref 3.5–5.1)
Sodium: 138 mmol/L (ref 135–145)

## 2021-11-05 LAB — T4, FREE: Free T4: 0.64 ng/dL (ref 0.61–1.12)

## 2021-11-05 MED ORDER — ACETAMINOPHEN 650 MG RE SUPP
650.0000 mg | Freq: Four times a day (QID) | RECTAL | Status: DC | PRN
Start: 2021-11-05 — End: 2021-11-08

## 2021-11-05 MED ORDER — CHLORHEXIDINE GLUCONATE 0.12 % MT SOLN
15.0000 mL | Freq: Two times a day (BID) | OROMUCOSAL | Status: DC
  Administered 2021-11-05 – 2021-11-08 (×4): 15 mL via OROMUCOSAL
  Filled 2021-11-05 (×4): qty 15

## 2021-11-05 MED ORDER — LACTATED RINGERS IV BOLUS
500.0000 mL | Freq: Once | INTRAVENOUS | Status: AC
  Administered 2021-11-05: 500 mL via INTRAVENOUS

## 2021-11-05 MED ORDER — HYDRALAZINE HCL 20 MG/ML IJ SOLN: 5.0000 mg | Freq: Four times a day (QID) | INTRAMUSCULAR | Status: DC | PRN

## 2021-11-05 MED ORDER — SODIUM CHLORIDE 0.9 % IV SOLN
1.0000 g | INTRAVENOUS | Status: DC
  Administered 2021-11-05 – 2021-11-08 (×4): 1 g via INTRAVENOUS
  Filled 2021-11-05 (×4): qty 10

## 2021-11-05 MED ORDER — ENOXAPARIN SODIUM 30 MG/0.3ML IJ SOSY
30.0000 mg | PREFILLED_SYRINGE | INTRAMUSCULAR | Status: DC
  Administered 2021-11-05 – 2021-11-08 (×4): 30 mg via SUBCUTANEOUS
  Filled 2021-11-05 (×4): qty 0.3

## 2021-11-05 MED ORDER — ORAL CARE MOUTH RINSE
15.0000 mL | Freq: Two times a day (BID) | OROMUCOSAL | Status: DC
  Administered 2021-11-05 – 2021-11-06 (×4): 15 mL via OROMUCOSAL

## 2021-11-05 MED ORDER — ACETAMINOPHEN 325 MG PO TABS
650.0000 mg | ORAL_TABLET | Freq: Four times a day (QID) | ORAL | Status: DC | PRN
Start: 2021-11-05 — End: 2021-11-08
  Administered 2021-11-08: 650 mg via ORAL
  Filled 2021-11-05: qty 2

## 2021-11-05 MED ORDER — HYDRALAZINE HCL 20 MG/ML IJ SOLN
10.0000 mg | INTRAMUSCULAR | Status: DC | PRN
Start: 2021-11-05 — End: 2021-11-05

## 2021-11-05 MED ORDER — LEVOTHYROXINE SODIUM 50 MCG PO TABS
75.0000 ug | ORAL_TABLET | Freq: Every day | ORAL | Status: DC
  Administered 2021-11-05: 75 ug via ORAL
  Filled 2021-11-05: qty 1

## 2021-11-05 MED ORDER — LEVOTHYROXINE SODIUM 50 MCG PO TABS
50.0000 ug | ORAL_TABLET | Freq: Every day | ORAL | Status: DC
  Administered 2021-11-07: 50 ug via ORAL
  Filled 2021-11-05: qty 1

## 2021-11-05 MED ORDER — DIPHENHYDRAMINE HCL 50 MG/ML IJ SOLN
25.0000 mg | Freq: Once | INTRAMUSCULAR | Status: AC
  Administered 2021-11-05: 25 mg via INTRAVENOUS
  Filled 2021-11-05: qty 1

## 2021-11-05 MED ORDER — LACTATED RINGERS IV SOLN: INTRAVENOUS | Status: DC

## 2021-11-05 MED ORDER — SODIUM CHLORIDE 0.9 % IV BOLUS
500.0000 mL | Freq: Once | INTRAVENOUS | Status: AC
  Administered 2021-11-05: 500 mL via INTRAVENOUS

## 2021-11-05 NOTE — Progress Notes (Signed)
Notified on call provider about patient being restless, being agitated, trying to climb out of the bed, and kept taking of the hand mitts.  ?

## 2021-11-05 NOTE — H&P (Signed)
?History and Physical  ? ? ?Laurie Ward YBO:[id number] DOB: 09/16/1926 DOA: 11/04/2021 ? ?PCP: Ronnald Nian, MD  ?Patient coming from: Home. ? ?History obtained from ER physician. ? ?Chief Complaint: Weakness and frequent falls. ? ?HPI: Laurie Ward is a 88 y.o. female with history of advanced dementia, hypothyroidism, hypertension, osteoporosis was brought to the ER after patient was having frequent falls over the last few days.  Over the last few days patient also has not been eating well.  Found to have increasing peripheral edema.  Patient was recently treated for UTI with Bactrim by primary care physician. ? ?ED Course: In the ER patient is not in distress.  Labs show acute renal failure with creatinine worsening from 0.8 in October 2021 it is around 1.4.  UA is concerning for UTI.  TSH elevated at 23 last week.  Patient was supposed to start Synthroid.  Not sure if it was started.  Trauma scan for fall with CT was done which shows lesion concerning for primary lung neoplasm.  Patient admitted for further work-up for generalized weakness frequent falls and UTI and renal failure. ? ?Review of Systems: As per HPI, rest all negative. ? ? ?Past Medical History:  ?Diagnosis Date  ? Arthritis   ? Asthma   ? COPD (chronic obstructive pulmonary disease) (HCC)   ? Former smoker   ? GERD (gastroesophageal reflux disease)   ? HH (hiatus hernia)   ? HH (hiatus hernia)   ? Hypercholesterolemia   ? Hypertension   ? Osteoporosis   ? OSTEOPENIA  ? Thyroid disease   ? HYPOTHYROID  ? ? ?Past Surgical History:  ?Procedure Laterality Date  ? ABDOMINAL HYSTERECTOMY    ? CHOLECYSTECTOMY    ? COLONOSCOPY  2003  ? Dr. Loreta Ave  ? I & D EXTREMITY Right 04/06/2020  ? Procedure: IRRIGATION AND DEBRIDEMENT RIGHT HAND;  Surgeon: Allena Napoleon, MD;  Location: [facility];  Service: Plastics;  Laterality: Right;  ? ? ? reports that she has quit smoking. She has never used smokeless tobacco. She reports that she does not drink alcohol and  does not use drugs. ? ?Allergies  ?Allergen Reactions  ? Levaquin [Levofloxacin In D5w] Other (See Comments)  ?  Unknown reaction  ? ? ?History reviewed. No pertinent family history. ? ?Prior to Admission medications   ?Medication Sig Start Date End Date Taking? Authorizing Provider  ?acetaminophen (TYLENOL) 500 MG tablet Take 500 mg by mouth every 6 (six) hours as needed for mild pain.    Yes [provider]  ?lisinopril-hydrochlorothiazide (ZESTORETIC) 20-12.5 MG tablet TAKE ONE (1) TABLET BY MOUTH DAILY ?Patient taking differently: Take 1 tablet by mouth daily. 09/17/21  Yes Ronnald Nian, MD  ?Multiple Vitamins-Minerals (PRESERVISION AREDS 2+MULTI VIT PO) Take 1 tablet by mouth daily.   Yes [provider]  ?levothyroxine (SYNTHROID) 50 MCG tablet Take 1 tablet (50 mcg total) by mouth daily. 11/04/21   Ronnald Nian, MD  ?levothyroxine (SYNTHROID) 75 MCG tablet Take 1 tablet (75 mcg total) by mouth daily. 12/05/21   Ronnald Nian, MD  ?sulfamethoxazole-trimethoprim (BACTRIM DS) 800-160 MG tablet Take 1 tablet by mouth 2 (two) times daily. ?Patient not taking: Reported on 11/04/2021 11/02/21   Ronnald Nian, MD  ? ? ?Physical Exam: ?Constitutional: Moderately built and nourished. ?Vitals:  ? 11/04/21 2100 11/04/21 2217 11/05/21 0000 11/05/21 0117  ?BP: 94/69 (!) 131/52 107/87 (!) 134/115  ?Pulse: 70 67 70 72  ?Resp: 17 16  17 17  ?Temp:    98.2 ?F (36.8 ?C)  ?TempSrc:    Oral  ?SpO2: 96% 97% 93% 98%  ? ?Eyes: Anicteric no pallor. ?ENMT: No discharge from the ears eyes nose and mouth. ?Neck: No mass felt.  No neck rigidity. ?Respiratory: No rhonchi or crepitations. ?Cardiovascular: S1-S2 heard. ?Abdomen: Soft nontender bowel sound present. ?Musculoskeletal: Mild edema of the lower extremities. ?Skin: No rash. ?Neurologic: Alert awake has severe dementia moving all extremities. ?Psychiatric: Has severe dementia. ? ? ?Labs on Admission: I have personally reviewed following labs and imaging  studies ? ?CBC: ?Recent Labs  ?Lab 11/02/21 ?1623 11/04/21 ?2051  ?WBC 9.3 15.9*  ?NEUTROABS 5.8 13.8*  ?HGB 11.4 11.5*  ?HCT 33.9* 34.1*  ?MCV 87 91.2  ?PLT 423 399  ? ?Basic Metabolic Panel: ?Recent Labs  ?Lab 11/02/21 ?1623 11/04/21 ?2051  ?NA 140 136  ?K 4.5 4.3  ?CL 105 108  ?CO2 19* 21*  ?GLUCOSE 105* 137*  ?BUN 41* 49*  ?CREATININE 1.41* 1.44*  ?CALCIUM 9.1 8.9  ? ?GFR: ?CrCl cannot be calculated (Unknown ideal weight.). ?Liver Function Tests: ?Recent Labs  ?Lab 11/02/21 ?1623 11/04/21 ?2051  ?AST 19 22  ?ALT 14 17  ?ALKPHOS 83 71  ?BILITOT 0.2 0.8  ?PROT 7.2 7.8  ?ALBUMIN 3.8 3.6  ? ?No results for input(s): LIPASE, AMYLASE in the last 168 hours. ?No results for input(s): AMMONIA in the last 168 hours. ?Coagulation Profile: ?No results for input(s): INR, PROTIME in the last 168 hours. ?Cardiac Enzymes: ?No results for input(s): CKTOTAL, CKMB, CKMBINDEX, TROPONINI in the last 168 hours. ?BNP (last 3 results) ?No results for input(s): PROBNP in the last 8760 hours. ?HbA1C: ?No results for input(s): HGBA1C in the last 72 hours. ?CBG: ?No results for input(s): GLUCAP in the last 168 hours. ?Lipid Profile: ?No results for input(s): CHOL, HDL, LDLCALC, TRIG, CHOLHDL, LDLDIRECT in the last 72 hours. ?Thyroid Function Tests: ?Recent Labs  ?  11/02/21 ?1623  ?TSH 23.900*  ? ?Anemia Panel: ?No results for input(s): VITAMINB12, FOLATE, FERRITIN, TIBC, IRON, RETICCTPCT in the last 72 hours. ?Urine analysis: ?   ?Component Value Date/Time  ? COLORURINE YELLOW 11/04/2021 2300  ? APPEARANCEUR TURBID (A) 11/04/2021 2300  ? LABSPEC 1.014 11/04/2021 2300  ? LABSPEC 1.020 11/02/2021 1629  ? PHURINE 5.0 11/04/2021 2300  ? GLUCOSEU NEGATIVE 11/04/2021 2300  ? HGBUR NEGATIVE 11/04/2021 2300  ? BILIRUBINUR NEGATIVE 11/04/2021 2300  ? BILIRUBINUR negative 11/02/2021 1629  ? BILIRUBINUR n 11/22/2016 1132  ? KETONESUR NEGATIVE 11/04/2021 2300  ? PROTEINUR 30 (A) 11/04/2021 2300  ? UROBILINOGEN 0.2 11/22/2016 1132  ? UROBILINOGEN 0.2  10/16/2012 0120  ? NITRITE NEGATIVE 11/04/2021 2300  ? LEUKOCYTESUR LARGE (A) 11/04/2021 2300  ? ?Sepsis Labs: ?@[email or web address] (procalcitonin:4,lacticidven:4) ?) ?Recent Results (from the past 240 hour(s))  ?Resp Panel by RT-PCR (Flu A&B, Covid) Nasopharyngeal Swab     Status: None  ? Collection Time: 11/04/21  8:51 PM  ? Specimen: Nasopharyngeal Swab; Nasopharyngeal(NP) swabs in vial transport medium  ?Result Value Ref Range Status  ? SARS Coronavirus 2 by RT PCR NEGATIVE NEGATIVE Final  ?  Comment: (NOTE) ?SARS-CoV-2 target nucleic acids are NOT DETECTED. ? ?The SARS-CoV-2 RNA is generally detectable in upper respiratory ?specimens during the acute phase of infection. The lowest ?concentration of SARS-CoV-2 viral copies this assay can detect is ?138 copies/mL. A negative result does not preclude SARS-Cov-2 ?infection and should not be used as the sole basis for treatment or ?other patient management decisions. A  negative result may occur with  ?improper specimen collection/handling, submission of specimen other ?than nasopharyngeal swab, presence of viral mutation(s) within the ?areas targeted by this assay, and inadequate number of viral ?copies(<138 copies/mL). A negative result must be combined with ?clinical observations, patient history, and epidemiological ?information. The expected result is Negative. ? ?Fact Sheet for Patients:  ?BloggerCourse.com ? ?Fact Sheet for Healthcare Providers:  ?SeriousBroker.it ? ?This test is no t yet approved or cleared by the [place] FDA and  ?has been authorized for detection and/or diagnosis of SARS-CoV-2 by ?FDA under an Emergency Use Authorization (EUA). This EUA will remain  ?in effect (meaning this test can be used) for the duration of the ?COVID-19 declaration under Section 564(b)(1) of the Act, 21 ?U.S.C.section 360bbb-3(b)(1), unless the authorization is terminated  ?or revoked sooner.  ? ? ?  ? Influenza A by PCR  NEGATIVE NEGATIVE Final  ? Influenza B by PCR NEGATIVE NEGATIVE Final  ?  Comment: (NOTE) ?The Xpert Xpress SARS-CoV-2/FLU/RSV plus assay is intended as an aid ?in the diagnosis of influenza from Nasopharyngeal

## 2021-11-05 NOTE — Progress Notes (Signed)
Dr. Waymon Amato alerted that patient is at high risk for injury. She cannot be easily redirected, is getting out of bed repeatedly, and urinating on the floor. Pulls out purewick so this is not an option. RN and/or aide staff at bedside almost continuously. Pain has been assessed and no pain indicators. Diversional activities, ie. Nature channel implemented without success. ? ?Dr. Waymon Amato in agreement that patient is not appropriate for tele-monitoring as she is requiring hands-on assistance. He requests that RN enter verbal order to this effect. (Options on order screen only allow for 1:1 observation once tele-monitoring trialed. Dr. Waymon Amato advises that 1:1 be implemented, not tele-monitoring.) ? ?Haydee Salter, RN ?11/05/21 ?5:34 PM ? ?

## 2021-11-05 NOTE — Evaluation (Signed)
Physical Therapy Evaluation ?Patient Details ?Name: Laurie Ward ?MRN: [id number] ?DOB: 08/20/1926 ?Today's Date: 11/05/2021 ? ?History of Present Illness ? Patient is 88 y.o. female brought to the ER after patient was having frequent falls over the last few days.  Over the last few days patient also has not been eating well.  Found to have increasing peripheral edema.  Patient was recently treated for UTI with Bactrim by PCP. ED workup revealed lesion concerning for primary lung neoplasm.  Patient admitted for further work-up for generalized weakness frequent falls and UTI and renal failure. PMH signficant for dementia, HTN, hypothyroidism, osteoporosis. ? ?  ?Clinical Impression ? Laurie Ward is 88 y.o. female admitted with above HPI and diagnosis. Patient is currently limited by functional impairments below (see PT problem list). Patient lives with her husband and has been requiring increased assist PTA with husband unable to physically assist pt fully. Overall she requires min assist with HHA to maintain balance in room for transfers, standing, and gait. Patient will benefit from continued skilled PT interventions to address impairments and progress independence with mobility, recommending ST rehab at SNF, discussed transition to ALF with pt's spouse and explained they will need to plan/coordinate that separate from this hospital stay and rehab at [facility]. Acute PT will follow and progress as able.    ?   ? ?Recommendations for follow up therapy are one component of a multi-disciplinary discharge planning process, led by the attending physician.  Recommendations may be updated based on patient status, additional functional criteria and insurance authorization. ? ?Follow Up Recommendations Skilled nursing-short term rehab (<3 hours/day) ? ?  ?Assistance Recommended at Discharge Frequent or constant Supervision/Assistance  ?Patient can return home with the following ? A little help with walking and/or transfers;A  little help with bathing/dressing/bathroom;Assistance with cooking/housework;Assistance with feeding;Direct supervision/assist for medications management;Direct supervision/assist for financial management;Assist for transportation;Help with stairs or ramp for entrance ? ?  ?Equipment Recommendations None recommended by PT  ?Recommendations for Other Services ?    ?  ?Functional Status Assessment Patient has had a recent decline in their functional status and/or demonstrates limited ability to make significant improvements in function in a reasonable and predictable amount of time  ? ?  ?Precautions / Restrictions Precautions ?Precautions: Fall ?Restrictions ?Weight Bearing Restrictions: No  ? ?  ? ?Mobility ? Bed Mobility ?Overal bed mobility: Needs Assistance ?Bed Mobility: Supine to Sit, Sit to Supine ?  ?  ?Supine to sit: Min guard ?Sit to supine: Min assist ?  ?General bed mobility comments: guarding for safety as pt is quick to sit up to EOB. Assist to raise bil LE's onto bed and return trunk to supine as pt has diffculty processing cues to initaite transfer. ?  ? ?Transfers ?Overall transfer level: Needs assistance ?Equipment used: Rolling walker (2 wheels) ?Transfers: Sit to/from Stand ?Sit to Stand: Min assist ?  ?  ?  ?  ?  ?General transfer comment: min assist to power up and steady with rise. pt with posterior lean in standing. manual assist with cue at hip to return to sit EOB. ?  ? ?Ambulation/Gait ?Ambulation/Gait assistance: Min assist ?Gait Distance (Feet): 10 Feet ?Assistive device: 1 person hand held assist ?Gait Pattern/deviations: Step-through pattern, Decreased step length - right, Decreased step length - left, Decreased stride length, Shuffle, Leaning posteriorly ?Gait velocity: decr ?  ?  ?General Gait Details: HHA min assist to steady with gait and direct turns to navigate hospital room. pt limited to  ambulation in room due to B/B incontinence. NT arrived to assist. ? ?Stairs ?  ?  ?  ?  ?   ? ?Wheelchair Mobility ?  ? ?Modified Rankin (Stroke Patients Only) ?  ? ?  ? ?Balance Overall balance assessment: Needs assistance ?Sitting-balance support: Feet supported ?Sitting balance-Leahy Scale: Good ?  ?Postural control: Posterior lean ?Standing balance support: Single extremity supported, During functional activity ?Standing balance-Leahy Scale: Poor ?Standing balance comment: reliant on external support and assist from therapist.. time spent standing at counter for support whiel PT and NT assisted pt with pericare and cleanup of floor due to incontinence. ?  ?  ?  ?  ?  ?  ?  ?  ?  ?  ?  ?   ? ? ? ?Pertinent Vitals/Pain    ? ? ?Home Living Family/patient expects to be discharged to:: Private residence ?Living Arrangements: Spouse/significant other ?Available Help at Discharge: Family ?Type of Home: House ?  ?  ?  ?  ?  ?  ?Additional Comments: pt unable to provide home and PLOF, pt's family arrived after session, briefly report that pt mobilizing at home but struggling. pt with with her older husband at home and he cannot provide physical assist.  ?  ?Prior Function Prior Level of Function : Patient poor historian/Family not available ?  ?  ?  ?  ?  ?  ?  ?  ?  ? ? ?Hand Dominance  ? Dominant Hand: Right ? ?  ?Extremity/Trunk Assessment  ? Upper Extremity Assessment ?Upper Extremity Assessment: Generalized weakness ?  ? ?Lower Extremity Assessment ?Lower Extremity Assessment: Generalized weakness ?  ? ?Cervical / Trunk Assessment ?Cervical / Trunk Assessment: Kyphotic  ?Communication  ? Communication: HOH  ?Cognition Arousal/Alertness: Awake/alert ?Behavior During Therapy: [facility] for tasks assessed/performed ?Overall Cognitive Status: Within Functional Limits for tasks assessed ?  ?  ?  ?  ?  ?  ?  ?  ?  ?  ?  ?  ?  ?  ?  ?  ?  ?  ?  ? ?  ?General Comments   ? ?  ?Exercises    ? ?Assessment/Plan  ?  ?PT Assessment Patient needs continued PT services  ?PT Problem List Decreased strength;Decreased range of  motion;Decreased activity tolerance;Decreased balance;Decreased mobility;Decreased knowledge of use of DME;Decreased safety awareness;Decreased knowledge of precautions ? ?   ?  ?PT Treatment Interventions DME instruction;Gait training;Stair training;Functional mobility training;Therapeutic activities;Therapeutic exercise;Balance training;Neuromuscular re-education;Patient/family education   ? ?PT Goals (Current goals can be found in the Care Plan section)  ?Acute Rehab PT Goals ?Patient Stated Goal: get to rehab and eventually ALF ?PT Goal Formulation: With family ?Time For Goal Achievement: 11/19/21 ?Potential to Achieve Goals: Fair ? ?  ?Frequency Min 2X/week ?  ? ? ?Co-evaluation   ?  ?  ?  ?  ? ? ?  ?AM-PAC PT "6 Clicks" Mobility  ?Outcome Measure Help needed turning from your back to your side while in a flat bed without using bedrails?: A Little ?Help needed moving from lying on your back to sitting on the side of a flat bed without using bedrails?: A Little ?Help needed moving to and from a bed to a chair (including a wheelchair)?: A Little ?Help needed standing up from a chair using your arms (e.g., wheelchair or bedside chair)?: A Little ?Help needed to walk in hospital room?: A Little ?Help needed climbing 3-5 steps with a railing? : A Lot ?6  Click Score: 17 ? ?  ?End of Session Equipment Utilized During Treatment: Gait belt ?Activity Tolerance: Patient tolerated treatment well ?Patient left: in bed;with call bell/phone within reach;with bed alarm set ?Nurse Communication: Mobility status ?PT Visit Diagnosis: Muscle weakness (generalized) (M62.81);Difficulty in walking, not elsewhere classified (R26.2);Unsteadiness on feet (R26.81) ?  ? ?Time: [id number] ?PT Time Calculation (min) (ACUTE ONLY): 34 min ? ? ?Charges:   PT Evaluation ?$PT Eval Low Complexity: 1 Low ?PT Treatments ?$Therapeutic Activity: 8-22 mins ?  ?   ? ? ?Renaldo Fiddlerachel Q. PT, DPT ?Acute Rehabilitation Services ?Office [phone] ?Pager  [phone] ? ?11/05/21 2:02 PM  ? ?Anitra Lauthachel E Quinn-Brown ?11/05/2021, 2:02 PM ? ?

## 2021-11-05 NOTE — Progress Notes (Addendum)
?PROGRESS NOTE ?  ?PAYSHENCE RINGGOLD  [id number]    DOB: 1926-09-05    DOA: 11/04/2021 ? ?PCP: [name], MD  ? ?I have briefly reviewed patients previous medical records in [facility]. ? ?Chief Complaint  ?Patient presents with  ? Fall  ? ? ?Brief Narrative:  ?88 year old female, from home, medical history significant for advanced dementia, COPD, GERD, hyperlipidemia, hypertension, hypothyroid presented to the ED 11/04/2021 following frequent falls over the last few days, not eating well, increasing peripheral edema.  Recently treated for UTI with Bactrim by PCP.  Admitted for AKI due to dehydration from poor oral intake, acute cystitis and untreated hypothyroid.  Treating with IVF, IV antibiotics. ? ? ?Assessment & Plan:  ?Principal Problem: ?  ARF (acute renal failure) ([place]) ?Active Problems: ?  COPD (chronic obstructive pulmonary disease) ([place]) ?  Hypertension ?  Hypothyroid ?  Alzheimer's dementia ([place]) ?  Weakness ? ? ?Acute kidney injury: Last creatinine PTA on 04/09/2020: 0.88.  Presented with creatinine of 1.41.  CT abdomen and pelvis 5/14: No obstructive changes in bladder well distended.  AKI possibly due to poor oral intake, simultaneous use of Zestoretic at home and was also on Bactrim.  Culprit meds held.  Bladder scan.  IV fluids.  Avoid nephrotoxic's.  Monitor BMP in AM.  If plateaus or not improving, it is certainly possible that she may have advanced to CKD stage IIIb compared to 2021. ? ?Acute cystitis without hematuria: Urine microscopy: Turbid, few bacteria and >50 WBC per hpf.  No history available from patient.  Treating empirically with IV ceftriaxone pending urine culture results. ? ?Hypothyroidism: TSH on 5/12: 23.9 but free T4 normal at 0.64.?  Sick euthyroid.  However given how high the TSH is, will treat and follow.  It appears that patient was supposed to start Synthroid 50 mcg daily x1 month followed by 75 mcg daily thereafter.  Started 50 mcg daily.  Follow full TFTs in 4  to 6 weeks. ? ?Generalized weakness, debility and frequent falls: Multifactorial due to very advanced age, dehydration, AKI, acute cystitis and untreated hypothyroidism.  CT C/A/P: Old left rib fractures, chronic L3 compression fracture.  PT consultation pending.  Patient reportedly lives with 27 year old spouse who may not be able to care for her.  Palliative care input also pending. ? ?Advanced dementia: Will need to check with family if current mental status is at baseline or worse than her baseline.  Delirium precautions.  If gets agitated then could consider low-dose Haldol or Seroquel at bedtime.  CT head and C-spine without acute findings. ? ?Lung mass highly concerning for malignancy: CT chest reports: "Spiculated mass as described above in the right upper lobe without significant lymphadenopathy. These changes are consistent with primary pulmonary neoplasm till proven otherwise".Would not be a candidate for aggressive evaluation or management given advanced age and dementia.  Palliative care input pending. ? ?Hypertension: Hold home dose of Zestoretic. ? ?Anemia: Hemoglobin stable in the 11 g range. ? ?There is no height or weight on file to calculate BMI. ? ? ?DVT prophylaxis: enoxaparin (LOVENOX) injection 30 mg Start: 11/05/21 1000   ?  Code Status: Full Code:  ?Family Communication: None at bedside. ?Disposition:  ?Status is: Observation ?The patient will require care spanning > 2 midnights and should be moved to inpatient because: Acute cystitis on IV antibiotics, IV fluids. ?  ? ? ?Consultants:   ?Palliative care medicine ? ?Procedures:   ? ? ?Antimicrobials:   ?IV ceftriaxone ? ? ?  Subjective:  ?Patient sleeping with both legs hanging off the bed rails onto the side.  As per nursing report, confused and tries to get out of bed.  Easily arousable, not oriented and unable to provide any history.  Cannot even tell me her name. ? ?Objective:  ? ?Vitals:  ? 11/05/21 0117 11/05/21 0141 11/05/21 0600  11/05/21 0945  ?BP: (!) 134/115 (!) 138/98 (!) 110/98 (!) 130/97  ?Pulse: 72 80 89 82  ?Resp: 17 19 16 16   ?Temp: 98.2 ?F (36.8 ?C) 97.9 ?F (36.6 ?C) 98.4 ?F (36.9 ?C) 98.4 ?F (36.9 ?C)  ?TempSrc: Oral Oral Oral   ?SpO2: 98% 97% 95% 100%  ? ? ?General exam: Early female, moderately built and nourished lying comfortably almost horizontally in bed with both legs hanging over the bed rails onto the side. ?Respiratory system: Clear to auscultation. Respiratory effort normal. ?Cardiovascular system: S1 & S2 heard, RRR. No JVD, murmurs, rubs, gallops or clicks. No pedal edema.  Telemetry personally reviewed: Sinus rhythm. ?Gastrointestinal system: Abdomen is nondistended, soft and nontender. No organomegaly or masses felt. Normal bowel sounds heard. ?Central nervous system: Sleepy but easily arousable and not oriented and does not follow instructions.  No focal neurological deficits. ?Extremities: Appear to be symmetric 5 x 5 power. ?Skin: No rashes, lesions or ulcers ?Psychiatry: Judgement and insight impaired.  Mood & affect cannot be assessed.  ? ? ? ?Data Reviewed:   ?I have personally reviewed following labs and imaging studies ? ? ?CBC: ?Recent Labs  ?Lab 11/02/21 ?1623 11/04/21 ?2051 11/05/21 ?0768  ?WBC 9.3 15.9* 13.6*  ?NEUTROABS 5.8 13.8*  --   ?HGB 11.4 11.5* 11.0*  ?HCT 33.9* 34.1* 34.8*  ?MCV 87 91.2 92.3  ?PLT 423 399 410*  ? ? ?Basic Metabolic Panel: ?Recent Labs  ?Lab 11/02/21 ?1623 11/04/21 ?2051 11/05/21 ?0881  ?NA 140 136 138  ?K 4.5 4.3 4.2  ?CL 105 108 110  ?CO2 19* 21* 18*  ?GLUCOSE 105* 137* 102*  ?BUN 41* 49* 42*  ?CREATININE 1.41* 1.44* 1.37*  ?CALCIUM 9.1 8.9 8.5*  ? ? ?Liver Function Tests: ?Recent Labs  ?Lab 11/02/21 ?1623 11/04/21 ?2051  ?AST 19 22  ?ALT 14 17  ?ALKPHOS 83 71  ?BILITOT 0.2 0.8  ?PROT 7.2 7.8  ?ALBUMIN 3.8 3.6  ? ? ?CBG: ?No results for input(s): GLUCAP in the last 168 hours. ? ?Microbiology Studies:  ? ?Recent Results (from the past 240 hour(s))  ?Resp Panel by RT-PCR (Flu  A&B, Covid) Nasopharyngeal Swab     Status: None  ? Collection Time: 11/04/21  8:51 PM  ? Specimen: Nasopharyngeal Swab; Nasopharyngeal(NP) swabs in vial transport medium  ?Result Value Ref Range Status  ? SARS Coronavirus 2 by RT PCR NEGATIVE NEGATIVE Final  ?  Comment: (NOTE) ?SARS-CoV-2 target nucleic acids are NOT DETECTED. ? ?The SARS-CoV-2 RNA is generally detectable in upper respiratory ?specimens during the acute phase of infection. The lowest ?concentration of SARS-CoV-2 viral copies this assay can detect is ?138 copies/mL. A negative result does not preclude SARS-Cov-2 ?infection and should not be used as the sole basis for treatment or ?other patient management decisions. A negative result may occur with  ?improper specimen collection/handling, submission of specimen other ?than nasopharyngeal swab, presence of viral mutation(s) within the ?areas targeted by this assay, and inadequate number of viral ?copies(<138 copies/mL). A negative result must be combined with ?clinical observations, patient history, and epidemiological ?information. The expected result is Negative. ? ?Fact Sheet for Patients:  ?BloggerCourse.com ? ?  Fact Sheet for Healthcare Providers:  ?IncredibleEmployment.be ? ?This test is no t yet approved or cleared by the [place] FDA and  ?has been authorized for detection and/or diagnosis of SARS-CoV-2 by ?FDA under an Emergency Use Authorization (EUA). This EUA will remain  ?in effect (meaning this test can be used) for the duration of the ?COVID-19 declaration under Section 564(b)(1) of the Act, 21 ?U.S.C.section 360bbb-3(b)(1), unless the authorization is terminated  ?or revoked sooner.  ? ? ?  ? Influenza A by PCR NEGATIVE NEGATIVE Final  ? Influenza B by PCR NEGATIVE NEGATIVE Final  ?  Comment: (NOTE) ?The Xpert Xpress SARS-CoV-2/FLU/RSV plus assay is intended as an aid ?in the diagnosis of influenza from Nasopharyngeal swab specimens  and ?should not be used as a sole basis for treatment. Nasal washings and ?aspirates are unacceptable for Xpert Xpress SARS-CoV-2/FLU/RSV ?testing. ? ?Fact Sheet for Patients: ?arrivance.com

## 2021-11-05 NOTE — Progress Notes (Addendum)
Patient confused with hx of dementia - only oriented to self.  Patient non-compliant with instructions and attempting to get OOB independently frequently.  Fall precautions in place.  Attempted to call family listed several times but with no response and no opportunity to leave voicemail. Will continue frequent rounds on patient. Also unable to complete admission at this time due to patients dementia.   ?

## 2021-11-05 NOTE — Consult Note (Addendum)
? ?                                                                                ?Consultation Note ?Date: 11/05/2021  ? ?Patient Name: Laurie Ward  ?DOB: 01/01/1927  MRN: [id number]  Age / Sex: 88 y.o., female  ?PCP: [name], MD ?Referring Physician: Modena Jansky, MD ? ?Reason for Consultation: Establishing goals of care ? ?HPI/Patient Profile: 88 y.o. female  with past medical history of dementia, hypothyroidism, HTN, hypercholesteremia, osteoporosis, arthritis, asthma, and COPD admitted on 11/04/2021 with frequent falls, increasing peripheral edema, and currently taking Bactrim for UTI diagnosed by PCP. Pt is receiving IVF and IV abx. She is at high risk for injury d/t continual want to get OOB. Currently has 1:1 sitter ordered. ? ?Clinical Assessment and Goals of Care: ?I have reviewed medical records including EPIC notes, labs and imaging, assessed the patient and then met with patient, patient's son Octavia Bruckner, and patient's husband Bud to discuss diagnosis prognosis, GOC, EOL wishes, disposition and options. ? ?I introduced Palliative Medicine as specialized medical care for people living with serious illness. It focuses on providing relief from the symptoms and stress of a serious illness. The goal is to improve quality of life for both the patient and the family. ? ?We discussed a brief life review of the patient. Patient has been married for over 60 years. She has two children, Tim and [place]. 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 ? ?As far as functional and nutritional status Tim endorses patient has flipped days/nights and has proven to be a challenge for his father (age 12) to care for her at home. She become irritated and combative. Octavia Bruckner says she picks and pulls at things, like taking pictures off of the wall and ripping them apart.  ? ?Bud says patient usually has a healthy appetite and can feed herself, but over the last 2 days  patient was not eating or drinking anything. ? ?We discussed patient's current illness and what it means in the larger context of patient's on-going co-morbidities.  Natural disease trajectory and expectations at EOL were discussed. Discussed that dementia is a chronic, progressive, and irreversible illness. Octavia Bruckner shares he has spoken with friends who have helped him understand what to expect with his mother's dementia. Bud did not engage in discussions of dementia. ? ?I attempted to elicit values and goals of care important to the patient. Tim and Bud defer to daughter Velva Harman for all medical decision making for the patient. Octavia Bruckner shares that both he and his father want what is best and safe for the patient. ? ?Bud shares he wants her to eat and drink. Her lunch arrived and I successfully fed patient a few bites of food. Bud continued to feed patient who initially resists but then would accept PO intake slowly.  ? ?Advance directives, concepts specific to code status, artificial feeding and hydration, and rehospitalization were considered and discussed. Again, Tim and Bud defer to [place] for these decisions.  ?  ?Discussed with patient/family the importance of continued conversation with family and the medical providers regarding  overall plan of care and treatment options, ensuring decisions are within the context of the patient?s values and GOCs.   ? ?Questions and concerns were addressed. I will attempt to speak with patient's daughter Velva Harman for further [place] discussions.  ? ?PMT will continue to follow the patient throughout her hospitalization. More education and ongoing discussions needed in regards to advanced dementia, code status, and prognosis.  ? ?Primary Decision Maker ?NEXT OF KIN ? ?Code Status/Advance Care Planning: ?Full code ? ?Prognosis:   ?Unable to determine ? ?Discharge Planning: To Be Determined ? ?Primary Diagnoses: ?Present on Admission: ? COPD (chronic obstructive pulmonary disease) ([place]) ?  Hypertension ? Hypothyroid ? Alzheimer's dementia ([place]) ? ARF (acute renal failure) ([place]) ? ? ?Physical Exam ?Vitals reviewed.  ?Constitutional:   ?   Appearance: Normal appearance.  ?HENT:  ?   Head: Normocephalic.  ?   Mouth/Throat:  ?   Mouth: Mucous membranes are moist.  ?Eyes:  ?   Pupils: Pupils are equal, round, and reactive to light.  ?Cardiovascular:  ?   Rate and Rhythm: Normal rate.  ?   Pulses: Normal pulses.  ?Pulmonary:  ?   Effort: Pulmonary effort is normal.  ?Abdominal:  ?   Palpations: Abdomen is soft.  ?Musculoskeletal:  ?   Comments: Fidgety, wants to get out of bed, move all extremities but no to command  ?Skin: ?   General: Skin is warm and dry.  ?Neurological:  ?   Mental Status: She is alert. She is disoriented.  ? ? ?Palliative Assessment/Data: 40% ? ? ? ? ?I discussed this patient's plan of care with patient, patient's son, patient's husband. ? ?Thank you for this consult. Palliative medicine will continue to follow and assist holistically.  ? ?Time Total: 75 minutes ?Greater than 50%  of this time was spent counseling and coordinating care related to the above assessment and plan. ? ?Signed by: ?Jordan Hawks, DNP, FNP-BC ?Palliative Medicine ? ?  ?Please contact Palliative Medicine Team phone at [phone] for questions and concerns.  ?For individual provider: See Amion ? ? ? ? ? ? ? ? ? ? ? ? ?  ?

## 2021-11-05 NOTE — Plan of Care (Signed)

## 2021-11-06 ENCOUNTER — Other Ambulatory Visit: Payer: Self-pay

## 2021-11-06 DIAGNOSIS — N179 Acute kidney failure, unspecified: Secondary | ICD-10-CM | POA: Diagnosis not present

## 2021-11-06 DIAGNOSIS — R531 Weakness: Secondary | ICD-10-CM | POA: Diagnosis not present

## 2021-11-06 DIAGNOSIS — N3000 Acute cystitis without hematuria: Secondary | ICD-10-CM | POA: Diagnosis not present

## 2021-11-06 DIAGNOSIS — J449 Chronic obstructive pulmonary disease, unspecified: Secondary | ICD-10-CM | POA: Diagnosis not present

## 2021-11-06 DIAGNOSIS — G309 Alzheimer's disease, unspecified: Secondary | ICD-10-CM | POA: Diagnosis not present

## 2021-11-06 DIAGNOSIS — Z66 Do not resuscitate: Secondary | ICD-10-CM

## 2021-11-06 LAB — BASIC METABOLIC PANEL
Anion gap: 8 (ref 5–15)
BUN: 39 mg/dL — ABNORMAL HIGH (ref 8–23)
CO2: 21 mmol/L — ABNORMAL LOW (ref 22–32)
Calcium: 8.6 mg/dL — ABNORMAL LOW (ref 8.9–10.3)
Chloride: 113 mmol/L — ABNORMAL HIGH (ref 98–111)
Creatinine, Ser: 1.39 mg/dL — ABNORMAL HIGH (ref 0.44–1.00)
GFR, Estimated: 36 mL/min — ABNORMAL LOW (ref >60)
Glucose, Bld: 89 mg/dL (ref 70–99)
Potassium: 3.7 mmol/L (ref 3.5–5.1)
Sodium: 142 mmol/L (ref 135–145)

## 2021-11-06 LAB — T3, FREE: T3, Free: 1.9 pg/mL — ABNORMAL LOW (ref 2.0–4.4)

## 2021-11-06 MED ORDER — ENSURE ENLIVE PO LIQD
237.0000 mL | Freq: Two times a day (BID) | ORAL | Status: DC
  Administered 2021-11-08: 237 mL via ORAL

## 2021-11-06 NOTE — Progress Notes (Addendum)
?PROGRESS NOTE ?  ?Laurie Ward  [id number]    DOB: [birth year]    DOA: 11/04/2021 ? ?PCP: [name], MD  ? ?I have briefly reviewed patients previous medical records in [facility]. ? ?Chief Complaint  ?Patient presents with  ? Fall  ? ? ?Brief Narrative:  ?88 year old female, from home, medical history significant for advanced dementia, COPD, GERD, hyperlipidemia, hypertension, hypothyroid presented to the ED 11/04/2021 following frequent falls over the last few days, not eating well, increasing peripheral edema.  Recently treated for UTI with Bactrim by PCP.  Admitted for AKI due to dehydration from poor oral intake, acute cystitis and untreated hypothyroid.  Treated with IV fluids and IV antibiotics.  AKI resolved.  Mental status at baseline.  Plan for DC to [facility] 5/17 pending insurance authorization.  Also will need to be off safety sitter x24 hours prior to SNF discharge.  Communicated extensively with patient's RN and TOC team. ? ? ?Assessment & Plan:  ?Principal Problem: ?  Acute cystitis without hematuria ?Active Problems: ?  COPD (chronic obstructive pulmonary disease) ([place]) ?  Hypertension ?  Hypothyroid ?  Alzheimer's dementia ([place]) ?  Weakness ?  Stage 3b chronic kidney disease (CKD) ([place]) ? ? ?Stage IIIb CKD: Last creatinine PTA on 04/09/2020: 0.88.  Presented with creatinine of 1.41.  CT abdomen and pelvis 5/14: No obstructive changes in bladder well distended.  Initially felt to have AKI possibly due to poor oral intake, simultaneous use of Zestoretic at home and was also on Bactrim.  Culprit meds held.  Bladder scan without retention.  IV fluids.  Avoid nephrotoxic's.  Creatinine has essentially stayed stable in the high 1.3-low 1.4 range since admission.  Highly likely that she has progressed from 2021 to stage IIIb CKD.  Stable.  Outpatient follow-up.  AKI ruled out. ? ?Acute cystitis without hematuria: Urine microscopy: Turbid, few bacteria and >50 WBC per hpf.  No history available  from patient.  Unfortunately no urine culture sample was sent.  Day 2/3 IV ceftriaxone to complete course. ? ?Hypothyroidism: TSH on 5/12: 23.9 but free T4 normal at 0.64.?  Sick euthyroid.  However given how high the TSH is, will treat and follow.  It appears that patient was supposed to start Synthroid 50 mcg daily x1 month followed by 75 mcg daily thereafter.  Started 50 mcg daily.  Follow full TFTs in 4 to 6 weeks. ? ?Generalized weakness, debility and frequent falls: Multifactorial due to very advanced age, dehydration, AKI, acute cystitis and untreated hypothyroidism.  CT C/A/P: Old left rib fractures, chronic L3 compression fracture.  PT consultation recommend SNF.  Patient reportedly lives with 60 year old spouse who will not be able to care for her according to daughter.  Palliative care team input appreciated, DNR established.  Recommend continued palliative care team follow-up at SNF. ? ?Advanced dementia/suspected end-stage dementia: Delirium precautions.  If gets agitated then could consider low-dose Haldol or Seroquel at bedtime.  CT head and C-spine without acute findings.  As per discussion with daughter today, patient appears to have advanced dementia and does not even recognize family.  She indicates that patient's current mental status is at baseline.  Safety sitter at bedside, may have to be discontinued x24 hours if patient has to go to SNF. ? ?Lung mass highly concerning for malignancy: CT chest reports: "Spiculated mass as described above in the right upper lobe without significant lymphadenopathy. These changes are consistent with primary pulmonary neoplasm till proven otherwise".Would not be  a candidate for aggressive evaluation or management given advanced age and dementia.  Palliative care did not address this.  I had a long discussion with patient's daughter and recommend no further evaluation of this given her advanced age and dementia and will not be a treatment candidate even if cancer  diagnosed.  Can be treated symptomatically if she develops symptoms such as dyspnea or chest pain with opioids etc. by hospice team. ? ?Hypertension: Hold home dose of Zestoretic.  Not sure if she was taking this.  Controlled. ? ?Anemia: Hemoglobin stable in the 11 g range. ? ?Body mass index is 17.5 kg/m?. ? ? ?DVT prophylaxis: enoxaparin (LOVENOX) injection 30 mg Start: 11/05/21 1000   ?  Code Status: DNR:  ?Family Communication: Daughter via phone. ?Disposition:  ?Status is: Inpatient ?  ? ? ?Consultants:   ?Palliative care medicine ? ?Procedures:   ? ? ?Antimicrobials:   ?IV ceftriaxone day 2/3 ? ? ?Subjective:  ?Pleasantly confused.  Not oriented.  As per sitter at bedside, gets up almost every 2 hours to bedside commode with assistance for urinating but otherwise no agitation or acute issues.  Ate almost 75% of breakfast mostly by herself.  Had an uneventful night. ? ?Objective:  ? ?Vitals:  ? 11/05/21 2313 11/06/21 0848 11/06/21 1347 11/06/21 1355  ?BP: (!) 135/54 (!) 144/50  (!) 123/51  ?Pulse: 63 64  (!) 58  ?Resp: 17 17  16   ?Temp: 97.7 ?F (36.5 ?C) 98.2 ?F (36.8 ?C)  98.2 ?F (36.8 ?C)  ?TempSrc: Oral Oral    ?SpO2: 98% 98%  97%  ?Weight:   42 kg   ?Height:   5\' 1"  (1.549 m)   ? ? ?General exam: Early female, moderately built and nourished lying comfortably supine in bed without distress. ?Respiratory system: Clear to auscultation. Respiratory effort normal. ?Cardiovascular system: S1 & S2 heard, RRR. No JVD, murmurs, rubs, gallops or clicks. No pedal edema.   ?Gastrointestinal system: Abdomen is nondistended, soft and nontender. No organomegaly or masses felt. Normal bowel sounds heard. ?Central nervous system: Alert and not oriented and does not follow instructions.  No focal neurological deficits. ?Extremities: Appear to be symmetric 5 x 5 power. ?Skin: No rashes, lesions or ulcers ?Psychiatry: Judgement and insight impaired.  Mood & affect pleasantly confused ? ? ? ?Data Reviewed:   ?I have personally  reviewed following labs and imaging studies ? ? ?CBC: ?Recent Labs  ?Lab 11/02/21 ?1623 11/04/21 ?2051 11/05/21 ?[id number]  ?WBC 9.3 15.9* 13.6*  ?NEUTROABS 5.8 13.8*  --   ?HGB 11.4 11.5* 11.0*  ?HCT 33.9* 34.1* 34.8*  ?MCV 87 91.2 92.3  ?PLT 423 399 410*  ? ? ?Basic Metabolic Panel: ?Recent Labs  ?Lab 11/02/21 ?1623 11/04/21 ?2051 11/05/21 ?[id number] 11/06/21 ?0350  ?NA 140 136 138 142  ?K 4.5 4.3 4.2 3.7  ?CL 105 108 110 113*  ?CO2 19* 21* 18* 21*  ?GLUCOSE 105* 137* 102* 89  ?BUN 41* 49* 42* 39*  ?CREATININE 1.41* 1.44* 1.37* 1.39*  ?CALCIUM 9.1 8.9 8.5* 8.6*  ? ? ?Liver Function Tests: ?Recent Labs  ?Lab 11/02/21 ?1623 11/04/21 ?2051  ?AST 19 22  ?ALT 14 17  ?ALKPHOS 83 71  ?BILITOT 0.2 0.8  ?PROT 7.2 7.8  ?ALBUMIN 3.8 3.6  ? ? ?CBG: ?No results for input(s): GLUCAP in the last 168 hours. ? ?Microbiology Studies:  ? ?Recent Results (from the past 240 hour(s))  ?Resp Panel by RT-PCR (Flu A&B, Covid) Nasopharyngeal Swab  Status: None  ? Collection Time: 11/04/21  8:51 PM  ? Specimen: Nasopharyngeal Swab; Nasopharyngeal(NP) swabs in vial transport medium  ?Result Value Ref Range Status  ? SARS Coronavirus 2 by RT PCR NEGATIVE NEGATIVE Final  ?  Comment: (NOTE) ?SARS-CoV-2 target nucleic acids are NOT DETECTED. ? ?The SARS-CoV-2 RNA is generally detectable in upper respiratory ?specimens during the acute phase of infection. The lowest ?concentration of SARS-CoV-2 viral copies this assay can detect is ?138 copies/mL. A negative result does not preclude SARS-Cov-2 ?infection and should not be used as the sole basis for treatment or ?other patient management decisions. A negative result may occur with  ?improper specimen collection/handling, submission of specimen other ?than nasopharyngeal swab, presence of viral mutation(s) within the ?areas targeted by this assay, and inadequate number of viral ?copies(<138 copies/mL). A negative result must be combined with ?clinical observations, patient history, and  epidemiological ?information. The expected result is Negative. ? ?Fact Sheet for Patients:  ?EntrepreneurPulse.com.au ? ?Fact Sheet for Healthcare Providers:  ?IncredibleEmployment.be

## 2021-11-06 NOTE — Progress Notes (Addendum)
Received order to D/C Safety sitter, pt has bed offer and Safety sitter discontinued to prepare pt for tentative discharge to nursing facility on 5/17. Family aware and MD made aware. Pt sleeping at present time will cont to check.  SRP RN ?

## 2021-11-06 NOTE — Progress Notes (Signed)
?                                                   ?Palliative Care Progress Note, Assessment & Plan  ? ?Patient Name: Laurie Ward       Date: 11/06/2021 ?DOB: [birth year]  Age: 88 y.o. MRN#: [id number] ?Attending Physician: Elease Etienne, MD ?Primary Care Physician: Ronnald Nian, MD ?Admit Date: 11/04/2021 ? ?Reason for Consultation/Follow-up: Establishing goals of care ? ?Subjective: ?Pt is sitting in bed in no apparent distress. She has a Recruitment consultant at bedside. She acknowledges my presence but is not able to carry on meaningful conversation. She denies pain or discomfort.  ? ?HPI: ?88 y.o. female  with past medical history of dementia, hypothyroidism, HTN, hypercholesteremia, osteoporosis, arthritis, asthma, and COPD admitted on 11/04/2021 with frequent falls, increasing peripheral edema, and currently taking Bactrim for UTI diagnosed by PCP. Pt is receiving IVF and IV abx. She is at high risk for injury d/t continual want to get OOB. Currently has 1:1 sitter. ? ?Summary of counseling/coordination of care: ?AFter reviewing the patient's chart and assessing the patient at bedside. I spoke with patient's daughter/decision maker [place] Sink. As per my discussion with patient, patient's husband and patient's brother, [place] Sink has been deemed by all family as decision maker for patient.  ? ?[place] Sink shares concerns that patient's dementia has progressed to a point where she is not able to stay at home safely.  ? ?She says her father/patient's husband has locked cabinets and fridges since patient will eat anything she sees. [place] Sink also shares that patient will rip up any paper/pictures/artwork she sees. Family has had to make many adjustments to caring for patient in her home but it has become unamanageable for patient's husband to be caregiver.  ? ?[place] Sink says patient has fallen and husband  is unable to pick her up off of the floor, but declines to call family since he does not want them to see her in that state. ? ?We discussed diagnosis, prognosis, code status and treatment options. We reviewed that right lobe mass was found and that Ct of chest is recommended. . ? ?We discussed malignancy vs benign tumor and what level of treatment, if any, patient would want to pursue. [place] Sink shares she does not want any aggressive treatment for potential lung malignancy, but she would like to further investigate severity of mass so that her mother's symptoms, if any arise, can be more appropriately managed.  ? ?Discussed code status. [place] Sink shared she knows her mother would want to be a DNR and even has completed this paperwork and a living will outlining such. [place] Sink encouraged to bring paperwork for review. [place] Sink confirmed that in the event of a cardiopulmonary arrest that patient would want to allow a natural death. Code status changed to DNR. Nursing and attending made aware.  ? ?[place] Sink asked if patient could receive a bronchoscopy. Pros and cons of biopsy reviewed. [place] Sink would like to move forward with CT of chest in order to have a more clear picture of how to proceed in caring for her mother. Attending Hongalgi made aware that [place] Sink would like an update from him.  ? ?Hospice and palliative outpatient services discussed in detail. [place] Sink asked appropriate questions regarding hospice services. Patient is not hospice inpatient ready but may qualify for hospice at the facility if  family would like to move with a comfort plan of care.  ? ?She is in agreement with plan to have patient placed in a nursing facility and unsure if hospice at the facility would be level of care she wants for her mother right now. We reviewed that [place] Sink will await approval of a bed in a facility prior to deciding on whether patient is ready for hospice.  ? ?Questions and concerns were addressed. PMT will continue to follow the patient throughout her  hospitalization.  ? ?Code Status: ?DNR ? ?Prognosis: ?Unable to determine ? ?Discharge Planning: ?To Be Determined ? ?Care plan was discussed with patient, patient's daughter/decision maker [place] Sink ? ?Physical Exam ?Vitals and nursing note reviewed.  ?Constitutional:   ?   Appearance: Normal appearance.  ?HENT:  ?   Head: Normocephalic.  ?   Nose: Nose normal.  ?   Mouth/Throat:  ?   Mouth: Mucous membranes are moist.  ?Eyes:  ?   Pupils: Pupils are equal, round, and reactive to light.  ?Cardiovascular:  ?   Rate and Rhythm: Normal rate.  ?   Pulses: Normal pulses.  ?Pulmonary:  ?   Effort: Pulmonary effort is normal.  ?Abdominal:  ?   Palpations: Abdomen is soft.  ?Musculoskeletal:  ?   Comments: Fidgety, moves all extremities but not on command  ?Neurological:  ?   Mental Status: She is alert. Mental status is at baseline. She is disoriented.  ?         ? ?Palliative Assessment/Data: 50% ? ? ? ?Total Time 50 minutes  ?Greater than 50%  of this time was spent counseling and coordinating care related to the above assessment and plan. ? ?Thank you for allowing the Palliative Medicine Team to assist in the care of this patient. ? ?Dalbert Mayotte. Daylynn Stumpp, DNP, FNP-BC ?Palliative Medicine Team ?Team Phone # [phone] ?  ?

## 2021-11-06 NOTE — Plan of Care (Signed)

## 2021-11-06 NOTE — TOC Progression Note (Signed)
Transition of Care (TOC) - Progression Note  ? ? ?Patient Details  ?Name: Laurie Ward ?MRN: [id number] ?Date of Birth: 06/08/1927 ? ?Transition of Care (TOC) CM/SW Contact  ?Geni Bers, RN ?Phone Number: ?11/06/2021, 12:42 PM ? ?Clinical Narrative:    ? ?Spoke with pt's daughter concerning SNF ST. Pt was faxed out to SNF. Will start insurance auth when pt is offered a bed.  ? ?Expected Discharge Plan: Skilled Nursing Facility ?Barriers to Discharge: No Barriers Identified ? ?Expected Discharge Plan and Services ?Expected Discharge Plan: Skilled Nursing Facility ?  ?  ?Post Acute Care Choice: Skilled Nursing Facility ?Living arrangements for the past 2 months: Skilled Nursing Facility ?                ?  ?  ?  ?  ?  ?  ?  ?  ?  ?  ? ? ?Social Determinants of Health (SDOH) Interventions ?  ? ?Readmission Risk Interventions ? ?  04/07/2020  ?  3:41 PM  ?Readmission Risk Prevention Plan  ?Post Dischage Appt Complete  ?Medication Screening Complete  ?Transportation Screening Complete  ? ? ?

## 2021-11-06 NOTE — NC FL2 (Signed)
?[place] MEDICAID FL2 LEVEL OF CARE SCREENING TOOL  ?  ? ?IDENTIFICATION  ?Patient Name: ?Laurie Ward Birthdate: 1926-12-31 Sex: female Admission Date (Current Location): ?11/04/2021  ?[place] and [place] Number: ? [place] ?  Facility and Address:  ?[facility],  [place] [place], [place] ?     Provider Number: ?[id number]  ?Attending Physician Name and Address:  ?Modena Jansky, MD ? Relative Name and Phone Number:  ?[name] (call first) Daughter   [phone] ?   ?Current Level of Care: ?Hospital Recommended Level of Care: ?[place] Prior Approval Number: ?  ? ?Date Approved/Denied: ?  PASRR Number: ?[id number] ? ?Discharge Plan: ?SNF ?  ? ?Current Diagnoses: ?Patient Active Problem List  ? Diagnosis Date Noted  ? ARF (acute renal failure) ([place]) 11/05/2021  ? Weakness 11/04/2021  ? Poor social situation 04/08/2020  ? Bite wound of right hand with infection 04/07/2020  ? Alzheimer's dementia ([place]) 04/07/2020  ? Cellulitis 04/06/2020  ? Mild cognitive impairment 08/07/2017  ? Esophageal reflux 02/02/2016  ? Hyperlipidemia LDL goal <100 02/02/2016  ? Osteoporosis 05/22/2011  ? COPD (chronic obstructive pulmonary disease) ([place]) 05/03/2011  ? Hypertension 05/03/2011  ? Hypothyroid 05/03/2011  ? ? ?Orientation RESPIRATION BLADDER Height & Weight   ?  ?Self ? Normal Incontinent Weight:   ?Height:     ?BEHAVIORAL SYMPTOMS/MOOD NEUROLOGICAL BOWEL NUTRITION STATUS  ?    Incontinent Diet (Heart Healthy)  ?AMBULATORY STATUS COMMUNICATION OF NEEDS Skin   ?Extensive Assist Verbally Normal ?  ?  ?  ?    ?     ?     ? ? ?Personal Care Assistance Level of Assistance  ?Bathing, Feeding, Dressing Bathing Assistance: Limited assistance ?Feeding assistance: Limited assistance ?Dressing Assistance: Limited assistance ?   ? ?Functional Limitations Info  ?Sight, Hearing, Speech Sight Info: Impaired (Glasses) ?Hearing Info: Adequate ?Speech Info: Adequate  ? ? ?SPECIAL CARE FACTORS  FREQUENCY  ?PT (By licensed PT), OT (By licensed OT)   ?  ?PT Frequency: 5x week ?OT Frequency: 5x week ?  ?  ?  ?   ? ? ?Contractures Contractures Info: Not present  ? ? ?Additional Factors Info  ?Code Status, Allergies Code Status Info: FULL ?Allergies Info: Levaquin (Levofloxacin In D5w) ?  ?  ?  ?   ? ?Current Medications (11/06/2021):  This is the current hospital active medication list ?Current Facility-Administered Medications  ?Medication Dose Route Frequency Provider Last Rate Last Admin  ? acetaminophen (TYLENOL) tablet 650 mg  650 mg Oral Q6H PRN Rise Patience, MD      ? Or  ? acetaminophen (TYLENOL) suppository 650 mg  650 mg Rectal Q6H PRN Rise Patience, MD      ? cefTRIAXone (ROCEPHIN) 1 g in sodium chloride 0.9 % 100 mL IVPB  1 g Intravenous Q24H Rise Patience, MD 200 mL/hr at 11/06/21 0256 1 g at 11/06/21 0256  ? chlorhexidine (PERIDEX) 0.12 % solution 15 mL  15 mL Mouth Rinse BID Rise Patience, MD   15 mL at 11/06/21 [id number]  ? enoxaparin (LOVENOX) injection 30 mg  30 mg Subcutaneous Q24H Rise Patience, MD   30 mg at 11/06/21 [id number]  ? hydrALAZINE (APRESOLINE) injection 5 mg  5 mg Intravenous Q6H PRN Modena Jansky, MD      ? lactated ringers infusion   Intravenous Continuous Modena Jansky, MD 50 mL/hr at 11/05/21 1502 New Bag at 11/05/21 1502  ? levothyroxine (  SYNTHROID) tablet 50 mcg  50 mcg Oral Daily Hongalgi, Lenis Dickinson, MD      ? MEDLINE mouth rinse  15 mL Mouth Rinse q12n4p Rise Patience, MD   15 mL at 11/06/21 1222  ? ? ? ?Discharge Medications: ?Please see discharge summary for a list of discharge medications. ? ?Relevant Imaging Results: ? ?Relevant Lab Results: ? ? ?Additional Information ?SS#[id number] ? ?Lastacia Solum, RN ? ? ? ? ?

## 2021-11-06 NOTE — TOC Progression Note (Signed)
Transition of Care (TOC) - Progression Note  ? ? ?Patient Details  ?Name: Laurie Ward ?MRN: [id number] ?Date of Birth: [birth year] ? ?Transition of Care (TOC) CM/SW Contact  ?Geni Bers, RN ?Phone Number: ?11/06/2021, 3:24 PM ? ?Clinical Narrative:    ?Daughter and pt's husband selected [facility].  Pt ready to discharge in AM.Will start insurance auth. ? ? ?Expected Discharge Plan: Skilled Nursing Facility ?Barriers to Discharge: No Barriers Identified ? ?Expected Discharge Plan and Services ?Expected Discharge Plan: Skilled Nursing Facility ?  ?  ?Post Acute Care Choice: Skilled Nursing Facility ?Living arrangements for the past 2 months: Skilled Nursing Facility ?                ?  ?  ?  ?  ?  ?  ?  ?  ?  ?  ? ? ?Social Determinants of Health (SDOH) Interventions ?  ? ?Readmission Risk Interventions ? ?  04/07/2020  ?  3:41 PM  ?Readmission Risk Prevention Plan  ?Post Dischage Appt Complete  ?Medication Screening Complete  ?Transportation Screening Complete  ? ? ?

## 2021-11-06 NOTE — TOC Progression Note (Signed)
Transition of Care (TOC) - Progression Note  ? ? ?Patient Details  ?Name: Laurie Ward ?MRN: [id number] ?Date of Birth: 04-Feb-1927 ? ?Transition of Care (TOC) CM/SW Contact  ?Geni Bers, RN ?Phone Number: ?11/06/2021, 3:46 PM ? ?Clinical Narrative:    ?Insurance auth started.  ? ? ?Expected Discharge Plan: Skilled Nursing Facility ?Barriers to Discharge: No Barriers Identified ? ?Expected Discharge Plan and Services ?Expected Discharge Plan: Skilled Nursing Facility ?  ?  ?Post Acute Care Choice: Skilled Nursing Facility ?Living arrangements for the past 2 months: Skilled Nursing Facility ?                ?  ?  ?  ?  ?  ?  ?  ?  ?  ?  ? ? ?Social Determinants of Health (SDOH) Interventions ?  ? ?Readmission Risk Interventions ? ?  04/07/2020  ?  3:41 PM  ?Readmission Risk Prevention Plan  ?Post Dischage Appt Complete  ?Medication Screening Complete  ?Transportation Screening Complete  ? ? ?

## 2021-11-07 DIAGNOSIS — R918 Other nonspecific abnormal finding of lung field: Secondary | ICD-10-CM

## 2021-11-07 DIAGNOSIS — D649 Anemia, unspecified: Secondary | ICD-10-CM

## 2021-11-07 LAB — CBC
HCT: 32.8 % — ABNORMAL LOW (ref 36.0–46.0)
Hemoglobin: 10.9 g/dL — ABNORMAL LOW (ref 12.0–15.0)
MCH: 30.1 pg (ref 26.0–34.0)
MCHC: 33.2 g/dL (ref 30.0–36.0)
MCV: 90.6 fL (ref 80.0–100.0)
Platelets: 388 10*3/uL (ref 150–400)
RBC: 3.62 MIL/uL — ABNORMAL LOW (ref 3.87–5.11)
RDW: 13.3 % (ref 11.5–15.5)
WBC: 7.4 10*3/uL (ref 4.0–10.5)
nRBC: 0 % (ref 0.0–0.2)

## 2021-11-07 LAB — BASIC METABOLIC PANEL
Anion gap: 8 (ref 5–15)
BUN: 29 mg/dL — ABNORMAL HIGH (ref 8–23)
CO2: 21 mmol/L — ABNORMAL LOW (ref 22–32)
Calcium: 8.5 mg/dL — ABNORMAL LOW (ref 8.9–10.3)
Chloride: 110 mmol/L (ref 98–111)
Creatinine, Ser: 1.07 mg/dL — ABNORMAL HIGH (ref 0.44–1.00)
GFR, Estimated: 49 mL/min — ABNORMAL LOW (ref >60)
Glucose, Bld: 98 mg/dL (ref 70–99)
Potassium: 4.1 mmol/L (ref 3.5–5.1)
Sodium: 139 mmol/L (ref 135–145)

## 2021-11-07 MED ORDER — QUETIAPINE FUMARATE 100 MG PO TABS
100.0000 mg | ORAL_TABLET | Freq: Once | ORAL | Status: AC
  Administered 2021-11-07: 100 mg via ORAL
  Filled 2021-11-07: qty 1

## 2021-11-07 MED ORDER — ADULT MULTIVITAMIN W/MINERALS CH
1.0000 | ORAL_TABLET | Freq: Every day | ORAL | Status: DC
  Administered 2021-11-08: 1 via ORAL
  Filled 2021-11-07: qty 1

## 2021-11-07 NOTE — Plan of Care (Signed)

## 2021-11-07 NOTE — Progress Notes (Signed)
Pt is alert and confused times 3, she is able to tell you her name. She has conversation unrelated to topic of discussion due to present cognitive state. Pt behavior wax and wane to one extreme, at times she is restless and there are times she naps for 2-3 hrs. Pt remains impulsive and has been successive on 2 hr bathroom regimen. Near late afternoon pt becomes more confused and impulsive attempting to get OOB. Pt coached back to bed several times throughout the day, requiring frequent visits from staff. NP made aware and orders received, will report off to night RN. SRP, RN ?

## 2021-11-07 NOTE — Assessment & Plan Note (Addendum)
Urine microscopy: Turbid, few bacteria and >50 WBC per hpf.  No history available from patient.  Unfortunately no urine culture sample was sent.  Had planned for 3 days abx.  She's s/p 4 days ceftriaxone, should be adequate course

## 2021-11-07 NOTE — Assessment & Plan Note (Addendum)
Multifactorial due to very advanced age, dehydration, AKI, acute cystitis and untreated hypothyroidism.  CT C/Laquinda Moller/P: Old left rib fractures, chronic L3 compression fracture.  PT consultation recommend SNF. After daughter looked at [facility], they've decided on home health.   Palliative care team input appreciated, DNR established.  Recommend continued palliative care follow outpatient.

## 2021-11-07 NOTE — Progress Notes (Signed)
Initial Nutrition Assessment ? ?DOCUMENTATION CODES:  ? ?Non-severe (moderate) malnutrition in context of chronic illness, Underweight ? ?INTERVENTION:  ?- Liberalize diet from a heart healthy to a regular diet to provide widest variety of menu options to enhance nutritional adequacy ? ?- Ensure Enlive po BID, each supplement provides 350 kcal and 20 grams of protein. ? ?- MVI with minerals daily ? ?NUTRITION DIAGNOSIS:  ? ?Moderate Malnutrition related to chronic illness (dementia) as evidenced by mild fat depletion, mild muscle depletion. ? ?GOAL:  ? ?Patient will meet greater than or equal to 90% of their needs ? ?MONITOR:  ? ?PO intake, Supplement acceptance, Diet advancement, Labs, Weight trends ? ?REASON FOR ASSESSMENT:  ? ?Malnutrition Screening Tool ?  ? ?ASSESSMENT:  ? ?Pt admitted after a fall. Found to have acute cystitis without hematuria. PMH significant for dementia, COPD, GERD, HLD, HTN and hypothyroid. ?  ?Pt noted to be medically stable for d/c. Pending referral for home health agency.  ? ?Pt is pleasant, laying in bed. No family present at bedside. She is unable to provide detailed history as she is noted to have dementia. Spoke with nurse at bedside who reports that she ate well for lunch and drank a whole Ensure. Will continue to provide Ensure throughout admission and adjust supplements as appropriate.   ? ?Meal completions: ?25-65% x 3 recorded meals throughout admission ? ?Reviewed weight history. Unfortunately there is limited documentation of weight history within the last year. Noted weight slowly trending down within the last 2 years. Current admit weight noted to be 42 kg. Will continue to monitor throughout admission.   ? ?Medications: peridex, synthroid, IV abx ? ?Labs: BUN 29, Cr 1.07, Calcium 8.5 ? ?NUTRITION - FOCUSED PHYSICAL EXAM: ? ?Flowsheet Row Most Recent Value  ?Orbital Region Mild depletion  ?Upper Arm Region Severe depletion  ?Thoracic and Lumbar Region Mild depletion   ?Buccal Region No depletion  ?Temple Region Mild depletion  ?Clavicle Bone Region Mild depletion  ?Clavicle and Acromion Bone Region Mild depletion  ?Scapular Bone Region Mild depletion  ?Dorsal Hand Mild depletion  ?Patellar Region Severe depletion  ?Anterior Thigh Region Severe depletion  ?Posterior Calf Region Severe depletion  ?Edema (RD Assessment) None  ?Hair Reviewed  ?Eyes Reviewed  ?Mouth Reviewed  ?Skin Reviewed  ?Nails Reviewed  ? ?  ? ? ?Diet Order:   ?Diet Order   ? ?       ?  Diet regular Room service appropriate? Yes; Fluid consistency: Thin  Diet effective now       ?  ? ?  ?  ? ?  ? ? ?EDUCATION NEEDS:  ? ?No education needs have been identified at this time ? ?Skin:  Skin Assessment: Reviewed RN Assessment ? ?Last BM:  5/16 (type 4) ? ?Height:  ? ?Ht Readings from Last 1 Encounters:  ?11/06/21 5\' 1"  (1.549 m)  ? ? ?Weight:  ? ?Wt Readings from Last 1 Encounters:  ?11/06/21 42 kg  ? ?BMI:  Body mass index is 17.5 kg/m?. ? ?Estimated Nutritional Needs:  ? ?Kcal:  1250-1450 ? ?Protein:  65-80g ? ?Fluid:  >/=1.5L ? ?11/08/21, RDN, LDN ?Clinical Nutrition ?

## 2021-11-07 NOTE — Assessment & Plan Note (Addendum)
Delirium precautions.  CT head and C-spine without acute findings.  As per prior MD's documentation/discussion with daughter today, patient appears to have advanced dementia and does not even recognize family.  She indicates that patient's current mental status is at baseline.   Delirium precautions trialed seroquel 100 mg last night, but didn't seem to help, will hold off on prescribing at discharge, follow outpatient with palliative to consider other options for sundowning/behavioral disturbances

## 2021-11-07 NOTE — TOC Progression Note (Signed)
Transition of Care (TOC) - Progression Note  ? ? ?Patient Details  ?Name: Laurie Ward ?MRN: [id number] ?Date of Birth: 15-Mar-1927 ? ?Transition of Care (TOC) CM/SW Contact  ?Geni Bers, RN ?Phone Number: ?11/07/2021, 4:18 PM ? ?Clinical Narrative:    ?Spoke with pt's daughter [name] concerning Palliative Care. [facility] was selected. Referral given to in house rep Cheri. [facility] will go visit pt at home.   ? ? ?Expected Discharge Plan: Skilled Nursing Facility ?Barriers to Discharge: No Barriers Identified ? ?Expected Discharge Plan and Services ?Expected Discharge Plan: Skilled Nursing Facility ?  ?  ?Post Acute Care Choice: Skilled Nursing Facility ?Living arrangements for the past 2 months: Skilled Nursing Facility ?                ?  ?  ?  ?  ?  ?  ?  ?  ?  ?  ? ? ?Social Determinants of Health (SDOH) Interventions ?  ? ?Readmission Risk Interventions ? ?  04/07/2020  ?  3:41 PM  ?Readmission Risk Prevention Plan  ?Post Dischage Appt Complete  ?Medication Screening Complete  ?Transportation Screening Complete  ? ? ?

## 2021-11-07 NOTE — Progress Notes (Signed)
After reviewing the patient's chart and assessing the patient at bedside, I counselled with patient's nurse. We reviewed that despite non-pharmacological efforts to encourage daytime wakefulness, patient continues to sundown and become agitated in the evening. ? ?Reviewed allergies and contraindications of use of seroquel. I spoke with patient's daughter [place] Sink and reviewed trial of seroqruel for this evening.  She was in agreement. ? ?Also discussed hospice vs palliative outpatient services. [place] Sink shares she would like to continue with [facility] and have palliative outpatient follow her mother. Reviewed that patient can enact Hospice benefits in the future should her mother's health decline and that palliative team will be able to helpd guide these decisions.  ? ?PE:  ? ?Pt resting comfortably in bed in no apparent distress. Respirations even and unlabored. No brow furrrowing, fidgeting, or signs of agitation noted. No family at bedside currently. ? ?Plan: ? ?Seroquel 100mg  PO QHS ? ?25 minutes ? ? L. Ellinore Merced, DNP, FNP-BC ?Palliative Medicine Team ?Team Phone # [phone] ? ?Greater than 50% of this time was spent counseling and coordinating care related to the above assessment and plan.  ?

## 2021-11-07 NOTE — Assessment & Plan Note (Addendum)
Last creatinine PTA on 04/09/2020: 0.88.   Presented with creatinine of 1.41.   CT without hydro  Renal function has improved today.  Unclear baseline, AKI possibly related to lisinopril/HCTZ, bactrim, poor PO intake? It had appeared that maybe he creatinine baseline was around 1.3-1.4, but improved today Continue to follow outpatient

## 2021-11-07 NOTE — TOC Progression Note (Addendum)
Transition of Care (TOC) - Progression Note  ? ? ?Patient Details  ?Name: YANETH FAIRBAIRN ?MRN: [id number] ?Date of Birth: 09/12/1926 ? ?Transition of Care (TOC) CM/SW Contact  ?Geni Bers, RN ?Phone Number: ?11/07/2021, 9:11 AM ? ?Clinical Narrative:    ?Daughter [place] Sink called, She states,"My father and I went to visit [facility]a we can not take her to that place. We went to [facility], they will not take her because of her memory problems. We will take her home with Home Health. We would like [place] Searing Referral given to [facility] in house rep. ? ? ?Expected Discharge Plan: Skilled Nursing Facility ?Barriers to Discharge: No Barriers Identified ? ?Expected Discharge Plan and Services ?Expected Discharge Plan: Skilled Nursing Facility ?  ?  ?Post Acute Care Choice: Skilled Nursing Facility ?Living arrangements for the past 2 months: Skilled Nursing Facility ?                ?  ?  ?  ?  ?  ?  ?  ?  ?  ?  ? ? ?Social Determinants of Health (SDOH) Interventions ?  ? ?Readmission Risk Interventions ? ?  04/07/2020  ?  3:41 PM  ?Readmission Risk Prevention Plan  ?Post Dischage Appt Complete  ?Medication Screening Complete  ?Transportation Screening Complete  ? ? ?

## 2021-11-07 NOTE — Hospital Course (Addendum)
88 year old female, from home, medical history significant for advanced dementia, COPD, GERD, hyperlipidemia, hypertension, hypothyroid presented to the ED 11/04/2021 following frequent falls over the last few days, not eating well, increasing peripheral edema.  She was recently treated for UTI with Bactrim by PCP prior to her admission.  She's been admitted for KI due to dehydration from poor oral intake, acute cystitis and untreated hypothyroidism.  Treated with IV fluids and IV antibiotics.  KI resolved.  Mental status at baseline with her dementia, she's impulsive and sundowns at night/afternoon.  PT recommended SNF, but  after daughter saw [facility], they decided to take her home instead.  She was seen by palliative care who recommended palliative to follow outpatient.   Dc home with home health today.  See below for additional details

## 2021-11-07 NOTE — Progress Notes (Signed)
Bed alarm remain activated throughout the shift and safety education re-enforced. SRP, RN  ?

## 2021-11-07 NOTE — TOC Progression Note (Signed)
Transition of Care (TOC) - Progression Note  ? ? ?Patient Details  ?Name: Laurie Ward ?MRN: [id number] ?Date of Birth: 05-19-1927 ? ?Transition of Care (TOC) CM/SW Contact  ?Geni Bers, RN ?Phone Number: ?11/07/2021, 3:30 PM ? ?Clinical Narrative:    ?Called pt's daughter with no answer.  ? ? ?Expected Discharge Plan: Skilled Nursing Facility ?Barriers to Discharge: No Barriers Identified ? ?Expected Discharge Plan and Services ?Expected Discharge Plan: Skilled Nursing Facility ?  ?  ?Post Acute Care Choice: Skilled Nursing Facility ?Living arrangements for the past 2 months: Skilled Nursing Facility ?                ?  ?  ?  ?  ?  ?  ?  ?  ?  ?  ? ? ?Social Determinants of Health (SDOH) Interventions ?  ? ?Readmission Risk Interventions ? ?  04/07/2020  ?  3:41 PM  ?Readmission Risk Prevention Plan  ?Post Dischage Appt Complete  ?Medication Screening Complete  ?Transportation Screening Complete  ? ? ?

## 2021-11-07 NOTE — Assessment & Plan Note (Addendum)
Spiculated mass as described above in the right upper lobe without significant lymphadenopathy. These changes are consistent with primary pulmonary neoplasm till proven otherwise".Would not be Laurie Ward candidate for aggressive evaluation or management given advanced age and dementia.  Prior MD had Laurie Ward long discussion with patient's daughter and recommend no further evaluation of this given her advanced age and dementia and will not be [name] treatment candidate even if cancer diagnosed.  Can be treated symptomatically if she develops symptoms such as dyspnea or chest pain with opioids etc. by palliative care team. Follow with PCP outpatient

## 2021-11-07 NOTE — Progress Notes (Signed)
Physical Therapy Treatment ?Patient Details ?Name: Laurie Ward ?MRN: [id number] ?DOB: [birth year] ?Today's Date: 11/07/2021 ? ? ?History of Present Illness Patient is 88 y.o. female brought to the ER after patient was having frequent falls over the last few days.  Over the last few days patient also has not been eating well.  Found to have increasing peripheral edema.  Patient was recently treated for UTI with Bactrim by PCP. ED workup revealed lesion concerning for primary lung neoplasm.  Patient admitted for further work-up for generalized weakness frequent falls and UTI and renal failure. PMH signficant for dementia, HTN, hypothyroidism, osteoporosis. ? ?  ?PT Comments  ? ? No family present during session. Pt required Min-Mod A for mobility. She walked a short distance across the room then back to bed. Multimodal cueing and encouragement require for participation. She is very unsteady and at risk for falls when mobilizing. Continue to recommend SNF unless family feels they can manage with pt at home. If she returns home, recommend HHPT.  ?   ?Recommendations for follow up therapy are one component of a multi-disciplinary discharge planning process, led by the attending physician.  Recommendations may be updated based on patient status, additional functional criteria and insurance authorization. ? ?Follow Up Recommendations ? Skilled nursing-short term rehab (<3 hours/day) (unless family decides to take pt back home-then HHPT) ?  ?  ?Assistance Recommended at Discharge Frequent or constant Supervision/Assistance  ?Patient can return home with the following A lot of help with walking and/or transfers;A lot of help with bathing/dressing/bathroom;Assistance with cooking/housework;Assist for transportation;Assistance with feeding;Help with stairs or ramp for entrance;Direct supervision/assist for financial management;Direct supervision/assist for medications management ?  ?Equipment Recommendations ? None recommended  by PT  ?  ?Recommendations for Other Services   ? ? ?  ?Precautions / Restrictions Precautions ?Precautions: Fall ?Precaution Comments: incontinent ?Restrictions ?Weight Bearing Restrictions: No  ?  ? ?Mobility ? Bed Mobility ?Overal bed mobility: Needs Assistance ?Bed Mobility: Supine to Sit, Sit to Supine ?  ?  ?Supine to sit: Mod assist, HOB elevated ?Sit to supine: Min assist ?  ?General bed mobility comments: Increased assist to encourage initiation and participation. assist for trunk and bil LEs. Multimodal cueing required. ?  ? ?Transfers ?Overall transfer level: Needs assistance ?Equipment used: 1 person hand held assist ?Transfers: Sit to/from Stand ?Sit to Stand: Min assist ?  ?  ?  ?  ?  ?General transfer comment: Multimodal cueing required. Assist to power up, steady, control descent. Very unsteady/at risk for falls. ?  ? ?Ambulation/Gait ?Ambulation/Gait assistance: Mod assist; +2 safety/equipment ?Gait Distance (Feet): 5 Feet (x2) ?Assistive device: 1 person hand held assist ("furniture walking") ?  ?  ?  ?  ?General Gait Details: Unsteady/at risk for falls. Pt reaching out for furniture to have 2 points of support. Walked across room to bsc then back to bed. ? ? ?Stairs ?  ?  ?  ?  ?  ? ? ?Wheelchair Mobility ?  ? ?Modified Rankin (Stroke Patients Only) ?  ? ? ?  ?Balance Overall balance assessment: Needs assistance ?  ?  ?  ?  ?Standing balance support: Single extremity supported, During functional activity ?Standing balance-Leahy Scale: Poor ?  ?  ?  ?  ?  ?  ?  ?  ?  ?  ?  ?  ?  ? ?  ?Cognition Arousal/Alertness: Awake/alert ?Behavior During Therapy: [facility] for tasks assessed/performed ?Overall Cognitive Status: History of cognitive impairments - at  baseline ?  ?  ?  ?  ?  ?  ?  ?  ?  ?  ?  ?  ?  ?  ?  ?  ?General Comments: does not follow 1 step commands consistently-requires multimodal cueing ?  ?  ? ?  ?Exercises   ? ?  ?General Comments   ?  ?  ? ?Pertinent Vitals/Pain Pain Assessment ?Pain  Assessment: Faces ?Faces Pain Scale: No hurt  ? ? ?Home Living   ?  ?  ?  ?  ?  ?  ?  ?  ?  ?   ?  ?Prior Function    ?  ?  ?   ? ?PT Goals (current goals can now be found in the care plan section) Progress towards PT goals: Progressing toward goals ? ?  ?Frequency ? ? ? Min 2X/week ? ? ? ?  ?PT Plan Current plan remains appropriate  ? ? ?Co-evaluation   ?  ?  ?  ?  ? ?  ?AM-PAC PT "6 Clicks" Mobility   ?Outcome Measure ? Help needed turning from your back to your side while in a flat bed without using bedrails?: A Lot ?Help needed moving from lying on your back to sitting on the side of a flat bed without using bedrails?: A Lot ?Help needed moving to and from a bed to a chair (including a wheelchair)?: A Lot ?Help needed standing up from a chair using your arms (e.g., wheelchair or bedside chair)?: A Lot ?Help needed to walk in hospital room?: A Lot ?Help needed climbing 3-5 steps with a railing? : A Lot ?6 Click Score: 12 ? ?  ?End of Session   ?Activity Tolerance: Patient tolerated treatment well ?Patient left: in bed;with call bell/phone within reach;with bed alarm set (bed alarm + posey belt alarm) ?  ?PT Visit Diagnosis: Muscle weakness (generalized) (M62.81);Difficulty in walking, not elsewhere classified (R26.2);Unsteadiness on feet (R26.81) ?  ? ? ?Time: 9563-8756 ?PT Time Calculation (min) (ACUTE ONLY): 18 min ? ?Charges:  $Gait Training: 8-22 mins          ?          ? ? ? ? ? ?Faye Ramsay, PT ?Acute Rehabilitation  ?Office: [phone] ?Pager: [phone] ? ?  ? ?

## 2021-11-07 NOTE — Assessment & Plan Note (Addendum)
Hold home dose of Zestoretic.  Not sure if she was taking this.   BP acceptable.

## 2021-11-07 NOTE — Assessment & Plan Note (Addendum)
TSH 23.9  free T4 normal at 0.64 ?  Sick euthyroid.  However given how high the TSH is, will treat and follow.  Started synthroid 50 mcg daily.

## 2021-11-07 NOTE — Progress Notes (Signed)
?PROGRESS NOTE ? ? ? ?Laurie Ward  [id number] DOB: 1927/05/19 DOA: 11/04/2021 ?PCP: [name], MD  ?Chief Complaint  ?Patient presents with  ? Fall  ? ? ?Brief Narrative:  ?88 year old female, from home, medical history significant for advanced dementia, COPD, GERD, hyperlipidemia, hypertension, hypothyroid presented to the ED 11/04/2021 following frequent falls over the last few days, not eating well, increasing peripheral edema.  Recently treated for UTI with Bactrim by PCP.  Admitted for AKI due to dehydration from poor oral intake, acute cystitis and untreated hypothyroid.  Treated with IV fluids and IV antibiotics.  AKI resolved.  Mental status at baseline.  Likely plan for d/c home.  Awaiting additional discussions with palliative. ?   ? ? ?Assessment & Plan: ?  ?Principal Problem: ?  Acute cystitis without hematuria ?Active Problems: ?  Stage 3b chronic kidney disease (CKD) ([place]) ?  Hypothyroid ?  Weakness ?  Alzheimer's dementia ([place]) ?  Lung mass ?  Hypertension ?  Anemia ?  COPD (chronic obstructive pulmonary disease) ([place]) ? ? ?Assessment and Plan: ?* Acute cystitis without hematuria ?Urine microscopy: Turbid, few bacteria and >50 WBC per hpf.  No history available from patient.  Unfortunately no urine culture sample was sent.  Day 2/3 IV ceftriaxone to complete course. ? ?Stage 3b chronic kidney disease (CKD) ([place]) ?Last creatinine PTA on 04/09/2020: 0.88.  Presented with creatinine of 1.41.  CT abdomen and pelvis 5/14: No obstructive changes in bladder well distended.  Initially felt to have AKI possibly due to poor oral intake, simultaneous use of Zestoretic at home and was also on Bactrim.  Culprit meds held.  Bladder scan without retention.  IV fluids.  Avoid nephrotoxic's.  Creatinine has essentially stayed stable in the high 1.3-low 1.4 range since admission.  Highly likely that she has progressed from 2021 to stage IIIb CKD.  Stable.  Outpatient follow-up.  AKI ruled  out. ? ?Hypothyroid ?TSH on 5/12: 23.9 but free T4 normal at 0.64.?  Sick euthyroid.  However given how high the TSH is, will treat and follow.  It appears that patient was supposed to start Synthroid 50 mcg daily x1 month followed by 75 mcg daily thereafter.  Started 50 mcg daily.  Follow full TFTs in 4 to 6 weeks. ? ?Weakness ?Multifactorial due to very advanced age, dehydration, AKI, acute cystitis and untreated hypothyroidism.  CT C/Tyaire Odem/P: Old left rib fractures, chronic L3 compression fracture.  PT consultation recommend SNF.  Patient reportedly lives with 6 year old spouse who will not be able to care for her according to daughter.  Palliative care team input appreciated, DNR established.  Recommend continued palliative care follow outpatient. ? ?Alzheimer's dementia ([name]) ?Delirium precautions.  If gets agitated then could consider low-dose Haldol or Seroquel at bedtime.  CT head and C-spine without acute findings.  As per prior MD discussion with daughter today, patient appears to have advanced dementia and does not even recognize family.  She indicates that patient's current mental status is at baseline.   ? ?Lung mass ?Spiculated mass as described above in the right upper lobe without significant lymphadenopathy. These changes are consistent with primary pulmonary neoplasm till proven otherwise".Would not be Laurie Ward candidate for aggressive evaluation or management given advanced age and dementia.  Prior MD had Laurie Ward long discussion with patient's daughter and recommend no further evaluation of this given her advanced age and dementia and will not be Laurie Ward treatment candidate even if cancer diagnosed.  Can be treated symptomatically if she  develops symptoms such as dyspnea or chest pain with opioids etc. by hospice team. ? ?Hypertension ?Hold home dose of Zestoretic.  Not sure if she was taking this.  Controlled. ? ? ? ? ? ? ? ? ? ?DVT prophylaxis: lovenox ?Code Status: DNR ?Family Communication: daughter ?Disposition:   ? ?Status is: Inpatient ?Remains inpatient appropriate because: continued discussions regarding safe discharge plan - home with HH vs with hospice vs (less likely per discussion SNF) ?  ?Consultants:  ?Palliative care ? ?Procedures:  ?none ? ?Antimicrobials:  ?Anti-infectives (From admission, onward)  ? ? Start     Dose/Rate Route Frequency Ordered Stop  ? 11/05/21 0200  cefTRIAXone (ROCEPHIN) 1 g in sodium chloride 0.9 % 100 mL IVPB       ? 1 g ?200 mL/hr over 30 Minutes Intravenous Every 24 hours 11/05/21 0123    ? ?  ? ? ?Subjective: ?No complaints ? ?Objective: ?Vitals:  ? 11/06/21 1952 11/07/21 0658 11/07/21 1208 11/07/21 2002  ?BP: (!) 116/98 (!) 164/83 110/70 (!) 143/65  ?Pulse: 65 69 66 70  ?Resp: 16 18 16 18   ?Temp: 97.8 ?F (36.6 ?C) 98.4 ?F (36.9 ?C) (!) 97.5 ?F (36.4 ?C) (!) 97.5 ?F (36.4 ?C)  ?TempSrc: Oral  Oral Oral  ?SpO2: 97% (!) 89% 97% 98%  ?Weight:      ?Height:      ? ? ?Intake/Output Summary (Last 24 hours) at 11/07/2021 2158 ?Last data filed at 11/07/2021 1306 ?Gross per 24 hour  ?Intake 240 ml  ?Output --  ?Net 240 ml  ? ?Filed Weights  ? 11/06/21 1347  ?Weight: 42 kg  ? ? ?Examination: ? ?General exam: Appears calm and comfortable  ?Respiratory system: unlabored ?Cardiovascular system: S1 & S2 heard, RRR.Laurie Ward ?Gastrointestinal system: Abdomen is nondistended, soft and nontender.  ?Central nervous system: Alert and disoriented. No focal neurological deficits. ?Extremities: no LEE ? ? ?Data Reviewed: I have personally reviewed following labs and imaging studies ? ?CBC: ?Recent Labs  ?Lab 11/02/21 ?1623 11/04/21 ?2051 11/05/21 ?[id number] 11/07/21 ?0622  ?WBC 9.3 15.9* 13.6* 7.4  ?NEUTROABS 5.8 13.8*  --   --   ?HGB 11.4 11.5* 11.0* 10.9*  ?HCT 33.9* 34.1* 34.8* 32.8*  ?MCV 87 91.2 92.3 90.6  ?PLT 423 399 410* 388  ? ? ?Basic Metabolic Panel: ?Recent Labs  ?Lab 11/02/21 ?1623 11/04/21 ?2051 11/05/21 ?[id number] 11/06/21 ?0350 11/07/21 ?[id number]  ?NA 140 136 138 142 139  ?K 4.5 4.3 4.2 3.7 4.1  ?CL 105 108 110 113*  110  ?CO2 19* 21* 18* 21* 21*  ?GLUCOSE 105* 137* 102* 89 98  ?BUN 41* 49* 42* 39* 29*  ?CREATININE 1.41* 1.44* 1.37* 1.39* 1.07*  ?CALCIUM 9.1 8.9 8.5* 8.6* 8.5*  ? ? ?GFR: ?Estimated Creatinine Clearance: 22.2 mL/min (Danasha Melman) (by C-G formula based on SCr of 1.07 mg/dL (H)). ? ?Liver Function Tests: ?Recent Labs  ?Lab 11/02/21 ?1623 11/04/21 ?2051  ?AST 19 22  ?ALT 14 17  ?ALKPHOS 83 71  ?BILITOT 0.2 0.8  ?PROT 7.2 7.8  ?ALBUMIN 3.8 3.6  ? ? ?CBG: ?No results for input(s): GLUCAP in the last 168 hours. ? ? ?Recent Results (from the past 240 hour(s))  ?Resp Panel by RT-PCR (Flu Kimber Esterly&B, Covid) Nasopharyngeal Swab     Status: None  ? Collection Time: 11/04/21  8:51 PM  ? Specimen: Nasopharyngeal Swab; Nasopharyngeal(NP) swabs in vial transport medium  ?Result Value Ref Range Status  ? SARS Coronavirus 2 by RT PCR NEGATIVE NEGATIVE Final  ?  Comment: (NOTE) ?SARS-CoV-2 target nucleic acids are NOT DETECTED. ? ?The SARS-CoV-2 RNA is generally detectable in upper respiratory ?specimens during the acute phase of infection. The lowest ?concentration of SARS-CoV-2 viral copies this assay can detect is ?138 copies/mL. Arnie Maiolo negative result does not preclude SARS-Cov-2 ?infection and should not be used as the sole basis for treatment or ?other patient management decisions. Kjell Brannen negative result may occur with  ?improper specimen collection/handling, submission of specimen other ?than nasopharyngeal swab, presence of viral mutation(s) within the ?areas targeted by this assay, and inadequate number of viral ?copies(<138 copies/mL). Faithann Natal negative result must be combined with ?clinical observations, patient history, and epidemiological ?information. The expected result is Negative. ? ?Fact Sheet for Patients:  ?EntrepreneurPulse.com.au ? ?Fact Sheet for Healthcare Providers:  ?IncredibleEmployment.be ? ?This test is no t yet approved or cleared by the [place] FDA and  ?has been authorized for detection  and/or diagnosis of SARS-CoV-2 by ?FDA under an Emergency Use Authorization (EUA). This EUA will remain  ?in effect (meaning this test can be used) for the duration of the ?COVID-19 declaration under Section 564(b)(1) of the

## 2021-11-08 ENCOUNTER — Telehealth: Payer: Self-pay

## 2021-11-08 ENCOUNTER — Other Ambulatory Visit (HOSPITAL_COMMUNITY): Payer: Self-pay

## 2021-11-08 DIAGNOSIS — E440 Moderate protein-calorie malnutrition: Secondary | ICD-10-CM

## 2021-11-08 MED ORDER — LEVOTHYROXINE SODIUM 50 MCG PO TABS
50.0000 ug | ORAL_TABLET | Freq: Every day | ORAL | 1 refills | Status: AC
  Filled 2021-11-08 – 2021-12-28 (×2): qty 30, 30d supply, fill #0

## 2021-11-08 NOTE — Plan of Care (Signed)
Discharge instructions reviewed with husband and son, questions answered, verbalized understanding.  Patient dressed and assisted into wheelchair, transported to main entrance placed in sons car to be taken home.

## 2021-11-08 NOTE — TOC Progression Note (Signed)
Transition of Care [facility]) - Progression Note    Patient Details  Name: Laurie Ward MRN: [id number] Date of Birth: [birth year]  [facility] of [facility] CM/SW Contact  Purcell Mouton, RN Phone Number: 11/08/2021, 1:05 PM  Clinical Narrative:    Spoke with pt's daughter concerning Hospice bed. [facility] was selected. Referral given to in house rep. Explained to Velva Harman that this would not hold up discharge for pt and [facility] will not move any furniture to place the bed. Velva Harman states, "Okay, ask [facility] to call me for the delivery of the bed".   Expected Discharge Plan: [place] Barriers to Discharge: No Barriers Identified  Expected Discharge Plan and Services Expected Discharge Plan: [place] Choice: [place] arrangements for the past 2 months: [place] Expected Discharge Date: 11/08/21                                     Social Determinants of Health (SDOH) Interventions    Readmission Risk Interventions    04/07/2020    3:41 PM  Readmission Risk Prevention Plan  Post Dischage Appt Complete  Medication Screening Complete  Transportation Screening Complete

## 2021-11-08 NOTE — Care Management Important Message (Signed)
Important Message  Patient Details IM Letter placed in Patients room. Name: Laurie Ward MRN: [id number] Date of Birth: 01-06-1927   Medicare Important Message Given:  Yes     Kerin Salen 11/08/2021, 10:30 AM

## 2021-11-08 NOTE — Assessment & Plan Note (Signed)
noted 

## 2021-11-08 NOTE — Progress Notes (Signed)
    This pt has been referred to our Care Connection program. This is a home-based Palliative care program that is provided by [facility]. We will follow the pt at home after discharge to assist with any chronic/acute symptom management needs. We utilize her PCP as the attending for this program. She will be getting nursing visits 1-3 times a month and SW support in the home with these services. She will also have after hours nursing support as well.    She is eligible for other HH services in home with our program in place as well.  Thank you for this referral Geroge Gilliam RN [phone]  

## 2021-11-08 NOTE — Progress Notes (Signed)
    Durable Medical Equipment  (From admission, onward)           Start     Ordered   11/08/21 1300  For home use only DME Hospital bed  Once       Question Answer Comment  Length of Need Lifetime   Patient has (list medical condition): Advanced Dementia, COPD, GERD   The above medical condition requires: Patient requires the ability to reposition frequently   Head must be elevated greater than: 45 degrees   Bed type Semi-electric   Support Surface: Low Air loss Mattress      11/08/21 1300   11/08/21 1212  For home use only DME Hospital bed  Once       Question Answer Comment  Length of Need Lifetime   The above medical condition requires: Patient requires the ability to reposition frequently   Bed type Semi-electric      11/08/21 1211

## 2021-11-08 NOTE — Discharge Summary (Signed)
Physician Discharge Summary  ZAILA SHANKMAN [id number] DOB: 1927/05/27 DOA: 11/04/2021  PCP: [name], MD  Admit date: 11/04/2021 Discharge date: 11/08/2021  Time spent: 40 minutes  Recommendations for Outpatient Follow-up:  Follow outpatient CBC/CMP  Follow outpatient thyroid function tests in 4-6 weeks Follow with palliative care outpatient Consider meds as needed for behavioral disturbances with dementia   Follow lung mass with PCP outpatient Follow BP and renal function outpatient   Discharge Diagnoses:  Principal Problem:   Acute cystitis without hematuria Active Problems:   Stage 3b chronic kidney disease (CKD) ([place])   Hypothyroid   Weakness   Alzheimer's dementia ([place])   Lung mass   Hypertension   Anemia   Protein-calorie malnutrition, moderate (HCC)   COPD (chronic obstructive pulmonary disease) ([place])   Discharge Condition: stable  Diet recommendation: heart healthy  Filed Weights   11/06/21 1347  Weight: 42 kg    History of present illness:  88 year old female, from home, medical history significant for advanced dementia, COPD, GERD, hyperlipidemia, hypertension, hypothyroid presented to the ED 11/04/2021 following frequent falls over the last few days, not eating well, increasing peripheral edema.  She was recently treated for UTI with Bactrim by PCP prior to her admission.  She's been admitted for AKI due to dehydration from poor oral intake, acute cystitis and untreated hypothyroidism.  Treated with IV fluids and IV antibiotics.  AKI resolved.  Mental status at baseline with her dementia, she's impulsive and sundowns at night/afternoon.  PT recommended SNF, but  after daughter saw [facility], they decided to take her home instead.  She was seen by palliative care who recommended palliative to follow outpatient.   Dc home with home health today.  See below for additional details  Hospital Course:  Assessment and Plan: * Acute  cystitis without hematuria Urine microscopy: Turbid, few bacteria and >50 WBC per hpf.  No history available from patient.  Unfortunately no urine culture sample was sent.  Had planned for 3 days abx.  She's s/p 4 days ceftriaxone, should be adequate course  Stage 3b chronic kidney disease (CKD) ([place]) Last creatinine PTA on 04/09/2020: 0.88.   Presented with creatinine of 1.41.   CT without hydro  Renal function has improved today.  Unclear baseline, AKI possibly related to lisinopril/HCTZ, bactrim, poor PO intake? It had appeared that maybe he creatinine baseline was around 1.3-1.4, but improved today Continue to follow outpatient   Hypothyroid TSH 23.9  free T4 normal at 0.64 ?  Sick euthyroid.  However given how high the TSH is, will treat and follow.  Started synthroid 50 mcg daily.  Weakness Multifactorial due to very advanced age, dehydration, AKI, acute cystitis and untreated hypothyroidism.  CT C/Edilia Ghuman/P: Old left rib fractures, chronic L3 compression fracture.  PT consultation recommend SNF. After daughter looked at [facility], they've decided on home health.   Palliative care team input appreciated, DNR established.  Recommend continued palliative care follow outpatient.  Alzheimer's dementia ([place]) Delirium precautions.  CT head and C-spine without acute findings.  As per prior MD's documentation/discussion with daughter today, patient appears to have advanced dementia and does not even recognize family.  She indicates that patient's current mental status is at baseline.   Delirium precautions trialed seroquel 100 mg last night, but didn't seem to help, will hold off on prescribing at discharge, follow outpatient with palliative to consider other options for sundowning/behavioral disturbances  Lung mass Spiculated mass as described above in the right upper lobe  without significant lymphadenopathy. These changes are consistent with primary pulmonary neoplasm till proven otherwise".Would not  be Onnie Alatorre candidate for aggressive evaluation or management given advanced age and dementia.  Prior MD had Sarabella Caprio long discussion with patient's daughter and recommend no further evaluation of this given her advanced age and dementia and will not be Argie Lober treatment candidate even if cancer diagnosed.  Can be treated symptomatically if she develops symptoms such as dyspnea or chest pain with opioids etc. by palliative care team. Follow with PCP outpatient  Hypertension Hold home dose of Zestoretic.  Not sure if she was taking this.   BP acceptable.  Protein-calorie malnutrition, moderate ([place]) noted      Procedures: none   Consultations: Palliative care  Discharge Exam: Vitals:   11/07/21 2002 11/08/21 1312  BP: (!) 143/65 (!) 135/57  Pulse: 70 (!) 51  Resp: 18 16  Temp: (!) 97.5 F (36.4 C)   SpO2: 98% 97%   Sleepy, difficult to arouse Discussed with RN who noted she'd been up for 2 hours when she first got here, ate, used BSC, was up in chair and slept around 10  General: No acute distress. Cardiovascular: RRR Lungs: unlabored Abdomen: Soft, nontender, nondistended Neurological: Alert and oriented 3. Moves all extremities 4. Cranial nerves II through XII grossly intact. Extremities: No clubbing or cyanosis. No edema.  Discharge Instructions   Discharge Instructions     Call MD for:  difficulty breathing, headache or visual disturbances   Complete by: As directed    Call MD for:  extreme fatigue   Complete by: As directed    Call MD for:  hives   Complete by: As directed    Call MD for:  persistant dizziness or light-headedness   Complete by: As directed    Call MD for:  persistant nausea and vomiting   Complete by: As directed    Call MD for:  redness, tenderness, or signs of infection (pain, swelling, redness, odor or green/yellow discharge around incision site)   Complete by: As directed    Call MD for:  severe uncontrolled pain   Complete by: As directed    Call MD  for:  temperature >100.4   Complete by: As directed    Diet - low sodium heart healthy   Complete by: As directed    Discharge instructions   Complete by: As directed    You were seen after falls, poor oral intake.    You were admitted with acute kidney injury, urinary infection, and hypothyroidism.  You were treated with 3 days of antibiotics for [place]cross UTI.  We'll stop your blood pressure medicine at discharge with the lisinopril/HCTZ.  Your thyroid function was abnormal.  We've started you on synthroid.  You should follow up with your outpatient primary care provider for rechecking your thyroid function in 4-6 weeks.  We're going to send you home with home health services (PT, OT, RN, aide, and social work).    You had Diana Davenport lung mass concerning for lung cancer, given your advanced dementia and advanced age we're recommending conservative management for this without aggressive workup or treatment.  You can follow with your PCP to discuss this further.  You were seen by palliative care here.  We're recommending palliative care follow outpatient with you.  Return for new, recurrent, or worsening symptoms.  Please ask your PCP to request records from this hospitalization so they know what was done and what the next steps will be.   Increase  activity slowly   Complete by: As directed       Allergies as of 11/08/2021       Reactions   Levaquin [levofloxacin In D5w] Other (See Comments)   Unknown reaction        Medication List     STOP taking these medications    lisinopril-hydrochlorothiazide 20-12.5 MG tablet Commonly known as: ZESTORETIC   sulfamethoxazole-trimethoprim 800-160 MG tablet Commonly known as: BACTRIM DS       TAKE these medications    acetaminophen 500 MG tablet Commonly known as: TYLENOL Take 500 mg by mouth every 6 (six) hours as needed for mild pain.   levothyroxine 50 MCG tablet Commonly known as: SYNTHROID Take 1 tablet (50 mcg total) by mouth  daily. Follow up with your PCP for refills. What changed:  additional instructions Another medication with the same name was removed. Continue taking this medication, and follow the directions you see here.   PRESERVISION AREDS 2+MULTI VIT PO Take 1 tablet by mouth daily.               Durable Medical Equipment  (From admission, onward)           Start     Ordered   11/08/21 1300  For home use only DME Hospital bed  Once       Question Answer Comment  Length of Need Lifetime   Patient has (list medical condition): Advanced Dementia, COPD, GERD   The above medical condition requires: Patient requires the ability to reposition frequently   Head must be elevated greater than: 45 degrees   Bed type Semi-electric   Support Surface: Low Air loss Mattress      11/08/21 1300   11/08/21 1212  For home use only DME Hospital bed  Once       Question Answer Comment  Length of Need Lifetime   The above medical condition requires: Patient requires the ability to reposition frequently   Bed type Semi-electric      11/08/21 1211           Allergies  Allergen Reactions   Levaquin [Levofloxacin In D5w] Other (See Comments)    Unknown reaction    Follow-up Information     [facility] Follow up.   Specialty: Home Health Services Why: Alvis Lemmings will follow you for Home Health. Call [facility] if you have any concerns or problems. Contact information: [address] [place] [address] [phone]                  The results of significant diagnostics from this hospitalization (including imaging, microbiology, ancillary and laboratory) are listed below for reference.    Significant Diagnostic Studies: CT HEAD WO CONTRAST  Result Date: 11/04/2021 CLINICAL DATA:  Multiple falls, dementia, hypertension EXAM: CT HEAD WITHOUT CONTRAST CT CERVICAL SPINE WITHOUT CONTRAST TECHNIQUE: Multidetector CT imaging of the head and cervical spine was  performed following the standard protocol without intravenous contrast. Multiplanar CT image reconstructions of the cervical spine were also generated. RADIATION DOSE REDUCTION: This exam was performed according to the departmental dose-optimization program which includes automated exposure control, adjustment of the mA and/or kV according to patient size and/or use of iterative reconstruction technique. COMPARISON:  None Available. FINDINGS: CT HEAD FINDINGS Brain: No evidence of acute infarction, hemorrhage, hydrocephalus, extra-axial collection or mass lesion/mass effect. Age related atrophy. Subcortical white matter and periventricular small vessel ischemic changes. Vascular: No hyperdense vessel or unexpected calcification. Skull:  Normal. Negative for fracture or focal lesion. Sinuses/Orbits: The visualized paranasal sinuses are essentially clear. The mastoid air cells are unopacified. Other: None. CT CERVICAL SPINE FINDINGS Alignment: Exaggerated upper cervical lordosis. Skull base and vertebrae: No acute fracture. No primary bone lesion or focal pathologic process. Soft tissues and spinal canal: No prevertebral fluid or swelling. No visible canal hematoma. Disc levels: Intervertebral disc spaces are maintained. Spinal canal is patent. Upper chest: Pertinent right upper lobe lung findings are incompletely visualized but evaluated on dedicated CT chest. Other: None. IMPRESSION: No evidence of acute intracranial abnormality. Atrophy with small vessel ischemic changes. No evidence of traumatic injury to the cervical spine. Pertinent right upper lobe lung findings evaluated on dedicated CT chest. Electronically Signed   By: Julian Hy M.D.   On: 11/04/2021 21:35   CT CERVICAL SPINE WO CONTRAST  Result Date: 11/04/2021 CLINICAL DATA:  Multiple falls, dementia, hypertension EXAM: CT HEAD WITHOUT CONTRAST CT CERVICAL SPINE WITHOUT CONTRAST TECHNIQUE: Multidetector CT imaging of the head and cervical spine  was performed following the standard protocol without intravenous contrast. Multiplanar CT image reconstructions of the cervical spine were also generated. RADIATION DOSE REDUCTION: This exam was performed according to the departmental dose-optimization program which includes automated exposure control, adjustment of the mA and/or kV according to patient size and/or use of iterative reconstruction technique. COMPARISON:  None Available. FINDINGS: CT HEAD FINDINGS Brain: No evidence of acute infarction, hemorrhage, hydrocephalus, extra-axial collection or mass lesion/mass effect. Age related atrophy. Subcortical white matter and periventricular small vessel ischemic changes. Vascular: No hyperdense vessel or unexpected calcification. Skull: Normal. Negative for fracture or focal lesion. Sinuses/Orbits: The visualized paranasal sinuses are essentially clear. The mastoid air cells are unopacified. Other: None. CT CERVICAL SPINE FINDINGS Alignment: Exaggerated upper cervical lordosis. Skull base and vertebrae: No acute fracture. No primary bone lesion or focal pathologic process. Soft tissues and spinal canal: No prevertebral fluid or swelling. No visible canal hematoma. Disc levels: Intervertebral disc spaces are maintained. Spinal canal is patent. Upper chest: Pertinent right upper lobe lung findings are incompletely visualized but evaluated on dedicated CT chest. Other: None. IMPRESSION: No evidence of acute intracranial abnormality. Atrophy with small vessel ischemic changes. No evidence of traumatic injury to the cervical spine. Pertinent right upper lobe lung findings evaluated on dedicated CT chest. Electronically Signed   By: Julian Hy M.D.   On: 11/04/2021 21:35   CT CHEST ABDOMEN PELVIS WO CONTRAST  Result Date: 11/04/2021 CLINICAL DATA:  Recent falls with chest and abdominal pain, initial encounter EXAM: CT CHEST, ABDOMEN AND PELVIS WITHOUT CONTRAST TECHNIQUE: Multidetector CT imaging of the  chest, abdomen and pelvis was performed following the standard protocol without IV contrast. RADIATION DOSE REDUCTION: This exam was performed according to the departmental dose-optimization program which includes automated exposure control, adjustment of the mA and/or kV according to patient size and/or use of iterative reconstruction technique. COMPARISON:  None Available. FINDINGS: CT CHEST FINDINGS Cardiovascular: Limited due to lack of IV contrast. No aneurysmal dilatation of the thoracic aorta is noted. Diffuse atherosclerotic calcifications are seen. Coronary calcifications are noted as well. Mediastinum/Nodes: Thoracic inlet is within normal limits. No hilar or mediastinal adenopathy is noted. The esophagus is within normal limits. Lungs/Pleura: Lungs are well aerated bilaterally. Diffuse emphysematous changes are seen. In the right upper lobe there is [place] spiculated mass that measures at least 2.1 by 1.8 cm with some localized finger-like extensions and fibrotic changes. This is consistent with primary pulmonary neoplasm till proven otherwise.  Patchy infiltrate is noted in the right lung base with some mild left basilar atelectasis. No sizable effusion is seen. Musculoskeletal: Degenerative changes of the thoracic spine are noted. No acute rib abnormality is seen. Multiple old left rib fractures are seen with callus formation. CT ABDOMEN PELVIS FINDINGS Hepatobiliary: No focal liver abnormality is seen. Status post cholecystectomy. No biliary dilatation. Pancreas: Unremarkable. No pancreatic ductal dilatation or surrounding inflammatory changes. Spleen: Normal in size without focal abnormality. Adrenals/Urinary Tract: Adrenal glands are within normal limits. Kidneys are well visualized bilaterally. No renal calculi or obstructive changes are noted. The bladder is well distended. Stomach/Bowel: Fecal material is noted throughout the colon without obstructive change. Scattered diverticular changes noted without  diverticulitis. The appendix is not well visualized although no inflammatory changes to suggest appendicitis are seen. Stomach and small bowel appear within normal limits. Vascular/Lymphatic: Aortic atherosclerosis. No enlarged abdominal or pelvic lymph nodes. Reproductive: Status post hysterectomy. No adnexal masses. Other: No abdominal wall hernia or abnormality. No abdominopelvic ascites. Musculoskeletal: Chronic appearing L3 compression fracture is noted. Multilevel degenerative changes are seen. IMPRESSION: CT of the chest: Spiculated mass as described above in the right upper lobe without significant lymphadenopathy. These changes are consistent with primary pulmonary neoplasm till proven otherwise. Given the patient's age and condition, the need for further workup can be determined on Chaye Misch clinical basis. Multiple old rib fractures on the left. CT of the abdomen and pelvis: Diverticulosis without diverticulitis. Chronic appearing L3 compression fracture. No other focal abnormality is noted. Electronically Signed   By: Alcide Clever M.D.   On: 11/04/2021 21:37    Microbiology: Recent Results (from the past 240 hour(s))  Resp Panel by RT-PCR (Flu Shannell Mikkelsen&B, Covid) Nasopharyngeal Swab     Status: None   Collection Time: 11/04/21  8:51 PM   Specimen: Nasopharyngeal Swab; Nasopharyngeal(NP) swabs in vial transport medium  Result Value Ref Range Status   SARS Coronavirus 2 by RT PCR NEGATIVE NEGATIVE Final    Comment: (NOTE) SARS-CoV-2 target nucleic acids are NOT DETECTED.  The SARS-CoV-2 RNA is generally detectable in upper respiratory specimens during the acute phase of infection. The lowest concentration of SARS-CoV-2 viral copies this assay can detect is 138 copies/mL. Mahlia Fernando negative result does not preclude SARS-Cov-2 infection and should not be used as the sole basis for treatment or other patient management decisions. Josephine Rudnick negative result may occur with  improper specimen collection/handling, submission of  specimen other than nasopharyngeal swab, presence of viral mutation(s) within the areas targeted by this assay, and inadequate number of viral copies(<138 copies/mL). Pallavi Clifton negative result must be combined with clinical observations, patient history, and epidemiological information. The expected result is Negative.  Fact Sheet for Patients:  BloggerCourse.com  Fact Sheet for Healthcare Providers:  SeriousBroker.it  This test is no t yet approved or cleared by the [place] FDA and  has been authorized for detection and/or diagnosis of SARS-CoV-2 by FDA under an Emergency Use Authorization (EUA). This EUA will remain  in effect (meaning this test can be used) for the duration of the COVID-19 declaration under Section 564(b)(1) of the Act, 21 U.S.C.section 360bbb-3(b)(1), unless the authorization is terminated  or revoked sooner.       Influenza Kenslee Achorn by PCR NEGATIVE NEGATIVE Final   Influenza B by PCR NEGATIVE NEGATIVE Final    Comment: (NOTE) The Xpert Xpress SARS-CoV-2/FLU/RSV plus assay is intended as an aid in the diagnosis of influenza from Nasopharyngeal swab specimens and should not be used as Tabias Swayze  sole basis for treatment. Nasal washings and aspirates are unacceptable for Xpert Xpress SARS-CoV-2/FLU/RSV testing.  Fact Sheet for Patients: EntrepreneurPulse.com.au  Fact Sheet for Healthcare Providers: IncredibleEmployment.be  This test is not yet approved or cleared by the [place] FDA and has been authorized for detection and/or diagnosis of SARS-CoV-2 by FDA under an Emergency Use Authorization (EUA). This EUA will remain in effect (meaning this test can be used) for the duration of the COVID-19 declaration under Section 564(b)(1) of the Act, 21 U.S.C. section 360bbb-3(b)(1), unless the authorization is terminated or revoked.  Performed at [facility], [place]  [address] [place], [place] [address]      Labs: Basic Metabolic Panel: Recent Labs  Lab 11/02/21 1623 11/04/21 2051 11/05/21 0338 11/06/21 0350 11/07/21 0622  NA 140 136 138 142 139  K 4.5 4.3 4.2 3.7 4.1  CL 105 108 110 113* 110  CO2 19* 21* 18* 21* 21*  GLUCOSE 105* 137* 102* 89 98  BUN 41* 49* 42* 39* 29*  CREATININE 1.41* 1.44* 1.37* 1.39* 1.07*  CALCIUM 9.1 8.9 8.5* 8.6* 8.5*   Liver Function Tests: Recent Labs  Lab 11/02/21 1623 11/04/21 2051  AST 19 22  ALT 14 17  ALKPHOS 83 71  BILITOT 0.2 0.8  PROT 7.2 7.8  ALBUMIN 3.8 3.6   No results for input(s): LIPASE, AMYLASE in the last 168 hours. No results for input(s): AMMONIA in the last 168 hours. CBC: Recent Labs  Lab 11/02/21 1623 11/04/21 2051 11/05/21 0338 11/07/21 0622  WBC 9.3 15.9* 13.6* 7.4  NEUTROABS 5.8 13.8*  --   --   HGB 11.4 11.5* 11.0* 10.9*  HCT 33.9* 34.1* 34.8* 32.8*  MCV 87 91.2 92.3 90.6  PLT 423 399 410* 388   Cardiac Enzymes: No results for input(s): CKTOTAL, CKMB, CKMBINDEX, TROPONINI in the last 168 hours. BNP: BNP (last 3 results) No results for input(s): BNP in the last 8760 hours.  ProBNP (last 3 results) No results for input(s): PROBNP in the last 8760 hours.  CBG: No results for input(s): GLUCAP in the last 168 hours.     Signed:  Fayrene Helper MD.  [facility] 11/08/2021, 1:13 PM

## 2021-11-08 NOTE — Telephone Encounter (Signed)
Manus Gunning from [facility]  called wanting a verbal order for palliative care. If ok I can call her back and let her know.

## 2021-11-08 NOTE — Progress Notes (Signed)
                                                     Palliative Care Progress Note, Assessment & Plan   Patient Name: Laurie Ward       Date: 11/08/2021 DOB: [birth year]  Age: 88 y.o. MRN#: [id number] Attending Physician: Elodia Florence., * Primary Care Physician: [name], MD Admit Date: 11/04/2021  Reason for Consultation/Follow-up: Establishing goals of care  Subjective: Pt is sitting in recliner. She acknowledges my presence but is not able to make her wishes known. She is able to verbalize words but they are nonsensical. No family at bedside presently.  HPI: 88 y.o. female  with past medical history of dementia, hypothyroidism, HTN, hypercholesteremia, osteoporosis, arthritis, asthma, and COPD admitted on 11/04/2021 with frequent falls, increasing peripheral edema, and currently taking Bactrim for UTI diagnosed by PCP. Pt is receiving IVF and IV abx. She is at high risk for injury d/t continual want to get OOB.  Summary of counseling/coordination of care: After reviewing the patient's chart and assessing the patient at bedside, I counseled with patient's nurse Nira Conn. Nira Conn shares that night shift conveyed patient was restless and unsettled for approximately 3 hours. Pt tolerated Seroquel but no decrease in restlessness/fidgeting noted.   I discussed with Dr. Florene Glen that trials of other medications for sundowning and restlessness should be initiated. Patient's discharge is likely happening today. Outpatient palliative referral placed in hopes of continuing symptoms management and [place] discussions with patient/family.   Code Status: DNR  Prognosis: Unable to determine  Discharge Planning: Home with Palliative Services  Care plan was discussed with patient, RN Nira Conn, Dr. Florene Glen  Physical Exam Vitals reviewed.  Constitutional:       General: She is not in acute distress.    Appearance: She is not toxic-appearing.  HENT:     Head: Normocephalic.     Mouth/Throat:     Mouth: Mucous membranes are moist.  Eyes:     Pupils: Pupils are equal, round, and reactive to light.  Cardiovascular:     Rate and Rhythm: Normal rate.     Pulses: Normal pulses.  Pulmonary:     Effort: Pulmonary effort is normal.  Abdominal:     Palpations: Abdomen is soft.  Musculoskeletal:     Comments: Fidgety, constantly moving  Skin:    General: Skin is warm and dry.  Neurological:     Mental Status: She is alert. Mental status is at baseline. She is disoriented.            Palliative Assessment/Data: 50%    Total Time 25 minutes  Greater than 50%  of this time was spent counseling and coordinating care related to the above assessment and plan.  Thank you for allowing the Palliative Medicine Team to assist in the care of this patient.  Wrightwood Ilsa Iha, FNP-BC Palliative Medicine Team Team Phone # [phone]

## 2021-11-09 ENCOUNTER — Telehealth: Payer: Self-pay

## 2021-11-09 NOTE — Telephone Encounter (Signed)
Transition Care Management Unsuccessful Follow-up Telephone Call  Date of discharge and from where:  [facility] 11/08/21  Attempts:  1st Attempt  Reason for unsuccessful TCM follow-up call:  Left voice message pt. Daughter has to call back after looking at calendar

## 2021-11-12 DIAGNOSIS — I129 Hypertensive chronic kidney disease with stage 1 through stage 4 chronic kidney disease, or unspecified chronic kidney disease: Secondary | ICD-10-CM | POA: Diagnosis not present

## 2021-11-12 DIAGNOSIS — F02C0 Dementia in other diseases classified elsewhere, severe, without behavioral disturbance, psychotic disturbance, mood disturbance, and anxiety: Secondary | ICD-10-CM | POA: Diagnosis not present

## 2021-11-12 DIAGNOSIS — N1832 Chronic kidney disease, stage 3b: Secondary | ICD-10-CM | POA: Diagnosis not present

## 2021-11-12 DIAGNOSIS — G309 Alzheimer's disease, unspecified: Secondary | ICD-10-CM | POA: Diagnosis not present

## 2021-11-12 DIAGNOSIS — D631 Anemia in chronic kidney disease: Secondary | ICD-10-CM | POA: Diagnosis not present

## 2021-11-13 DIAGNOSIS — I129 Hypertensive chronic kidney disease with stage 1 through stage 4 chronic kidney disease, or unspecified chronic kidney disease: Secondary | ICD-10-CM | POA: Diagnosis not present

## 2021-11-13 DIAGNOSIS — D631 Anemia in chronic kidney disease: Secondary | ICD-10-CM | POA: Diagnosis not present

## 2021-11-13 DIAGNOSIS — F02C0 Dementia in other diseases classified elsewhere, severe, without behavioral disturbance, psychotic disturbance, mood disturbance, and anxiety: Secondary | ICD-10-CM | POA: Diagnosis not present

## 2021-11-13 DIAGNOSIS — G309 Alzheimer's disease, unspecified: Secondary | ICD-10-CM | POA: Diagnosis not present

## 2021-11-13 DIAGNOSIS — N1832 Chronic kidney disease, stage 3b: Secondary | ICD-10-CM | POA: Diagnosis not present

## 2021-11-15 ENCOUNTER — Telehealth: Payer: Self-pay | Admitting: Family Medicine

## 2021-11-15 DIAGNOSIS — F02C0 Dementia in other diseases classified elsewhere, severe, without behavioral disturbance, psychotic disturbance, mood disturbance, and anxiety: Secondary | ICD-10-CM | POA: Diagnosis not present

## 2021-11-15 DIAGNOSIS — I129 Hypertensive chronic kidney disease with stage 1 through stage 4 chronic kidney disease, or unspecified chronic kidney disease: Secondary | ICD-10-CM | POA: Diagnosis not present

## 2021-11-15 DIAGNOSIS — D631 Anemia in chronic kidney disease: Secondary | ICD-10-CM | POA: Diagnosis not present

## 2021-11-15 DIAGNOSIS — N1832 Chronic kidney disease, stage 3b: Secondary | ICD-10-CM | POA: Diagnosis not present

## 2021-11-15 DIAGNOSIS — G309 Alzheimer's disease, unspecified: Secondary | ICD-10-CM | POA: Diagnosis not present

## 2021-11-15 NOTE — Telephone Encounter (Signed)
Laurie Ward called in from an agency that is helping pt with some therapy. Laurie states the pt isn't doing well with therapy due to her dementia and she says it may not be too helpful, wanted to give you an update on her.

## 2021-11-21 ENCOUNTER — Ambulatory Visit (INDEPENDENT_AMBULATORY_CARE_PROVIDER_SITE_OTHER): Payer: Medicare Other | Admitting: Family Medicine

## 2021-11-21 ENCOUNTER — Encounter: Payer: Self-pay | Admitting: Family Medicine

## 2021-11-21 VITALS — BP 138/72 | HR 62 | Temp 96.8°F | Wt 121.8 lb

## 2021-11-21 DIAGNOSIS — R918 Other nonspecific abnormal finding of lung field: Secondary | ICD-10-CM | POA: Diagnosis not present

## 2021-11-21 DIAGNOSIS — E039 Hypothyroidism, unspecified: Secondary | ICD-10-CM

## 2021-11-21 DIAGNOSIS — G309 Alzheimer's disease, unspecified: Secondary | ICD-10-CM | POA: Diagnosis not present

## 2021-11-21 DIAGNOSIS — I10 Essential (primary) hypertension: Secondary | ICD-10-CM | POA: Diagnosis not present

## 2021-11-21 DIAGNOSIS — F0280 Dementia in other diseases classified elsewhere without behavioral disturbance: Secondary | ICD-10-CM

## 2021-11-21 DIAGNOSIS — Z659 Problem related to unspecified psychosocial circumstances: Secondary | ICD-10-CM

## 2021-11-21 DIAGNOSIS — Z87440 Personal history of urinary (tract) infections: Secondary | ICD-10-CM

## 2021-11-21 NOTE — Progress Notes (Signed)
   Subjective:    Patient ID: Laurie Ward, female    DOB: 1926/11/03, 88 y.o.   MRN: [id number]  HPI She is here for follow-up after recent hospitalization on May 14 and sent home on the 18th.  She was found to have a UTI.  Further scanning also showed a lung mass that would need follow-up on that.  Her thyroid function was abnormal.  She was given 50 mcg of thyroid medication however increase that to 75 mcg within the last several weeks.  She does have underlying Alzheimer's dementia and she and her husband live together with the help of relatives.  Home health is supposed to be visiting them in the near future.   Review of Systems     Objective:   Physical Exam Alert and in no distress.  Hospital admission, blood work and discharge summary was reviewed. She is alert and in no distress.  Cardiac exam shows regular rhythm without murmurs or gallops.      Assessment & Plan:   Alzheimer's dementia, unspecified dementia severity, unspecified timing of dementia onset, unspecified whether behavioral, psychotic, or mood disturbance or anxiety (HCC) - Plan: CBC with Differential/Platelet, Comprehensive metabolic panel  Essential hypertension - Plan: CBC with Differential/Platelet, Comprehensive metabolic panel  Lung mass  History of UTI  Poor social situation  Acquired hypothyroidism I discussed the lung mass with the patient and her daughter and at this point they are not interested in further interaction concerning this.  We will also do routine blood screening on her.  Home health is supposed to be visiting to assess the home situation and we will also need help with med reconciliation.  They return here in roughly 6 weeks for follow-up on the hypothyroid.

## 2021-11-22 DIAGNOSIS — I129 Hypertensive chronic kidney disease with stage 1 through stage 4 chronic kidney disease, or unspecified chronic kidney disease: Secondary | ICD-10-CM | POA: Diagnosis not present

## 2021-11-22 DIAGNOSIS — F02C0 Dementia in other diseases classified elsewhere, severe, without behavioral disturbance, psychotic disturbance, mood disturbance, and anxiety: Secondary | ICD-10-CM | POA: Diagnosis not present

## 2021-11-22 DIAGNOSIS — D631 Anemia in chronic kidney disease: Secondary | ICD-10-CM | POA: Diagnosis not present

## 2021-11-22 DIAGNOSIS — G309 Alzheimer's disease, unspecified: Secondary | ICD-10-CM | POA: Diagnosis not present

## 2021-11-22 DIAGNOSIS — N1832 Chronic kidney disease, stage 3b: Secondary | ICD-10-CM | POA: Diagnosis not present

## 2021-11-22 LAB — CBC WITH DIFFERENTIAL/PLATELET
Basophils Absolute: 0.1 10*3/uL (ref 0.0–0.2)
Basos: 1 %
EOS (ABSOLUTE): 0.3 10*3/uL (ref 0.0–0.4)
Eos: 3 %
Hematocrit: 33.2 % — ABNORMAL LOW (ref 34.0–46.6)
Hemoglobin: 11 g/dL — ABNORMAL LOW (ref 11.1–15.9)
Immature Grans (Abs): 0 10*3/uL (ref 0.0–0.1)
Immature Granulocytes: 0 %
Lymphocytes Absolute: 2.7 10*3/uL (ref 0.7–3.1)
Lymphs: 34 %
MCH: 29.4 pg (ref 26.6–33.0)
MCHC: 33.1 g/dL (ref 31.5–35.7)
MCV: 89 fL (ref 79–97)
Monocytes Absolute: 0.8 10*3/uL (ref 0.1–0.9)
Monocytes: 11 %
Neutrophils Absolute: 4 10*3/uL (ref 1.4–7.0)
Neutrophils: 51 %
Platelets: 435 10*3/uL (ref 150–450)
RBC: 3.74 x10E6/uL — ABNORMAL LOW (ref 3.77–5.28)
RDW: 13.5 % (ref 11.7–15.4)
WBC: 7.9 10*3/uL (ref 3.4–10.8)

## 2021-11-22 LAB — COMPREHENSIVE METABOLIC PANEL
ALT: 12 IU/L (ref 0–32)
AST: 15 IU/L (ref 0–40)
Albumin/Globulin Ratio: 1.3 (ref 1.2–2.2)
Albumin: 4 g/dL (ref 3.5–4.6)
Alkaline Phosphatase: 69 IU/L (ref 44–121)
BUN/Creatinine Ratio: 30 — ABNORMAL HIGH (ref 12–28)
BUN: 35 mg/dL (ref 10–36)
Bilirubin Total: 0.3 mg/dL (ref 0.0–1.2)
CO2: 24 mmol/L (ref 20–29)
Calcium: 9.3 mg/dL (ref 8.7–10.3)
Chloride: 105 mmol/L (ref 96–106)
Creatinine, Ser: 1.15 mg/dL — ABNORMAL HIGH (ref 0.57–1.00)
Globulin, Total: 3.2 g/dL (ref 1.5–4.5)
Glucose: 98 mg/dL (ref 70–99)
Potassium: 4.7 mmol/L (ref 3.5–5.2)
Sodium: 141 mmol/L (ref 134–144)
Total Protein: 7.2 g/dL (ref 6.0–8.5)
eGFR: 45 mL/min/{1.73_m2} — ABNORMAL LOW (ref >59)

## 2021-11-22 NOTE — Addendum Note (Signed)
Addended by: Ronnald Nian on: 11/22/2021 12:45 PM   Modules accepted: Orders

## 2021-11-23 ENCOUNTER — Telehealth: Payer: Self-pay | Admitting: Family Medicine

## 2021-11-23 NOTE — Progress Notes (Signed)
  Care Management   Follow Up Note   11/23/2021 Name: KAMBRY TAKACS MRN: [id number] DOB: 30-Sep-1926   Referred by: Ronnald Nian, MD Reason for referral : No chief complaint on file.   An unsuccessful telephone outreach was attempted today. The patient was referred to the case management team for assistance with care management and care coordination.   Follow Up Plan: The care management team will reach out to the patient again over the next 3 days.   SIGNATURE  [place]

## 2021-11-23 NOTE — Telephone Encounter (Signed)
Angie from [facility] called and states that pt had missed her home health nursing visit this week, just a fyi, She can be reached at [phone]

## 2021-11-26 ENCOUNTER — Telehealth: Payer: Self-pay | Admitting: Family Medicine

## 2021-11-26 DIAGNOSIS — N1832 Chronic kidney disease, stage 3b: Secondary | ICD-10-CM | POA: Diagnosis not present

## 2021-11-26 DIAGNOSIS — F02C0 Dementia in other diseases classified elsewhere, severe, without behavioral disturbance, psychotic disturbance, mood disturbance, and anxiety: Secondary | ICD-10-CM | POA: Diagnosis not present

## 2021-11-26 DIAGNOSIS — D631 Anemia in chronic kidney disease: Secondary | ICD-10-CM | POA: Diagnosis not present

## 2021-11-26 DIAGNOSIS — G309 Alzheimer's disease, unspecified: Secondary | ICD-10-CM | POA: Diagnosis not present

## 2021-11-26 DIAGNOSIS — I129 Hypertensive chronic kidney disease with stage 1 through stage 4 chronic kidney disease, or unspecified chronic kidney disease: Secondary | ICD-10-CM | POA: Diagnosis not present

## 2021-11-26 NOTE — Progress Notes (Signed)
  Care Management   Follow Up Note   11/26/2021 Name: Laurie Ward MRN: [id number] DOB: [birth year]   Referred by: [name], MD Reason for referral : No chief complaint on file.   A second unsuccessful telephone outreach was attempted today. The patient was referred to the case management team for assistance with care management and care coordination.   Follow Up Plan: The care management team will reach out to the patient again over the next 1 days.   [place]

## 2021-11-28 ENCOUNTER — Telehealth: Payer: Self-pay | Admitting: Family Medicine

## 2021-11-28 NOTE — Progress Notes (Signed)
  Care Management   Follow Up Note   11/28/2021 Name: Laurie Ward MRN: [id number] DOB: 1926/10/15   Referred by: Ronnald Nian, MD Reason for referral : No chief complaint on file.   Third unsuccessful telephone outreach was attempted today. The patient was referred to the case management team for assistance with care management and care coordination. The patient's primary care provider has been notified of our unsuccessful attempts to make or maintain contact with the patient. The care management team is pleased to engage with this patient at any time in the future should he/she be interested in assistance from the care management team.   Follow Up Plan: No further follow up required:    SIGNATURE Leeann Aldridge [employer]

## 2021-12-28 ENCOUNTER — Encounter: Payer: Self-pay | Admitting: Family Medicine

## 2021-12-29 ENCOUNTER — Other Ambulatory Visit (HOSPITAL_COMMUNITY): Payer: Self-pay

## 2022-01-16 ENCOUNTER — Ambulatory Visit: Payer: Medicare Other | Admitting: Family Medicine

## 2022-02-02 ENCOUNTER — Other Ambulatory Visit: Payer: Self-pay | Admitting: Family Medicine

## 2022-02-02 DIAGNOSIS — E039 Hypothyroidism, unspecified: Secondary | ICD-10-CM

## 2022-02-27 ENCOUNTER — Encounter: Payer: Self-pay | Admitting: Internal Medicine

## 2022-04-02 ENCOUNTER — Encounter: Payer: Self-pay | Admitting: Internal Medicine

## 2022-04-08 ENCOUNTER — Telehealth: Payer: Self-pay | Admitting: Family Medicine

## 2022-04-08 NOTE — Telephone Encounter (Signed)
Pt was on [facility] report as deceased. She was a long time pt of [facility]. I think sending a card would be nice. She dies under Hospice care so chart will need to be noted. DOD 04/28/2022.

## 2022-04-08 NOTE — Telephone Encounter (Signed)
Misty from [facility] calling to inform you Laurie Ward passed away there.

## 2022-04-08 NOTE — Telephone Encounter (Signed)
Card sent 

## 2022-04-24 DEATH — deceased

## 2022-06-24 IMAGING — CT CT CERVICAL SPINE W/O CM
2 series · 14 of 29 positions shown, 18 images · non-contrast
Comparison: None Available.

CLINICAL DATA: Multiple falls, dementia, hypertension



[Series 5: c spine soft · axial · 0.36mm/px · z∈[-253,-141]mm · 9 of 68 slices shown, 12 images]
[im 6/68  soft-tissue]
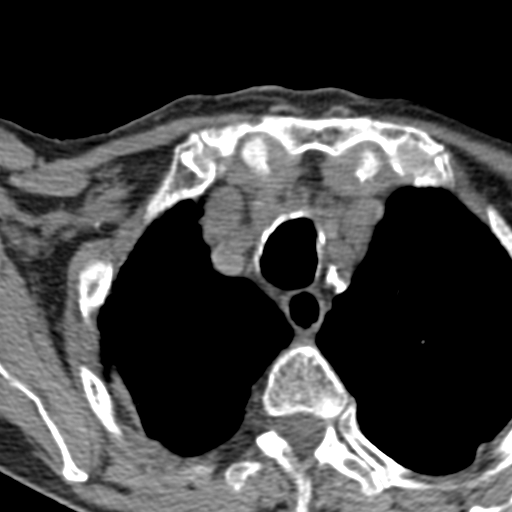
[im 6/68  bone]
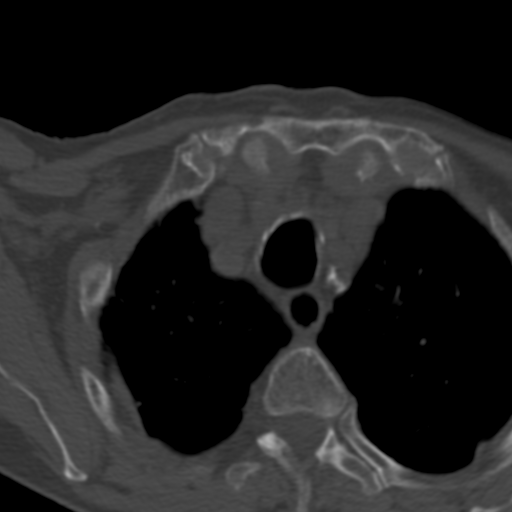
[im 16/68  bone]
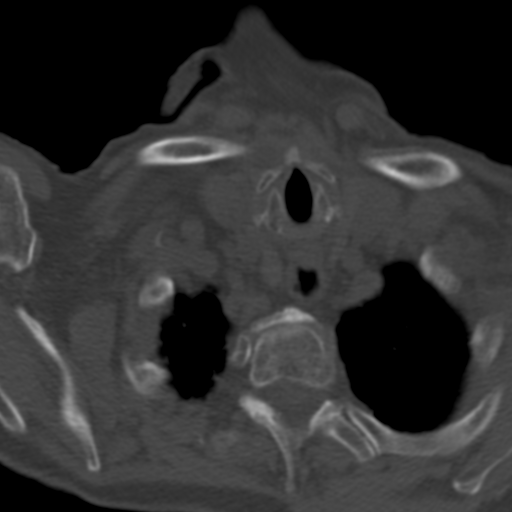
[im 21/68  bone]
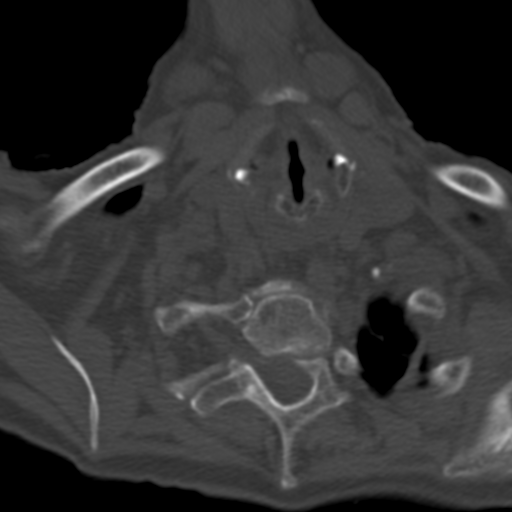
[im 26/68  bone]
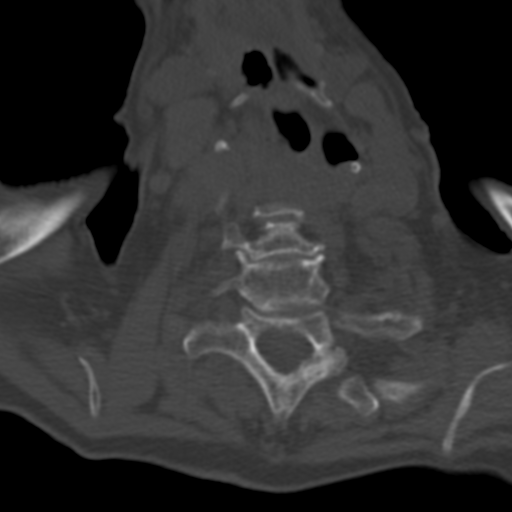
[im 37/68  soft-tissue]
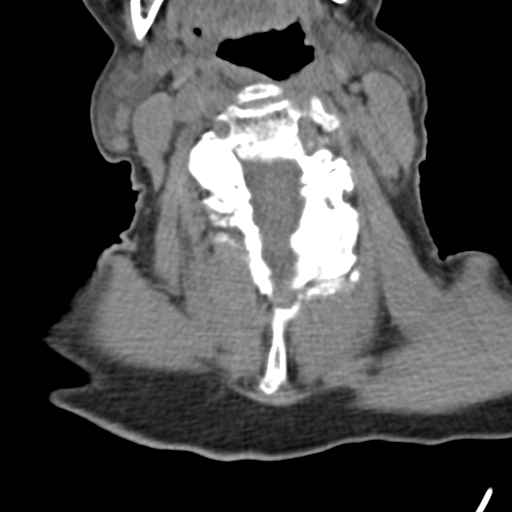
[im 37/68  bone]
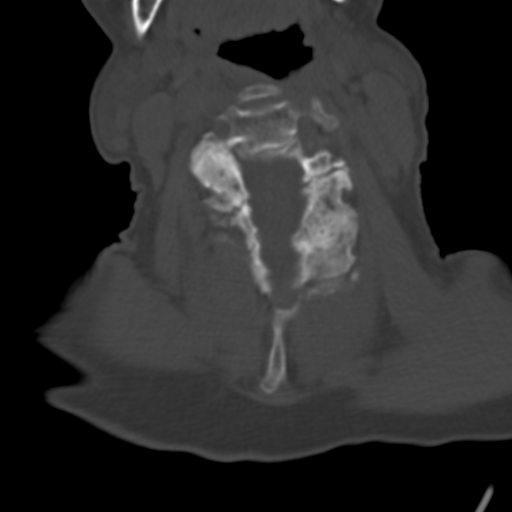
[im 42/68  bone]
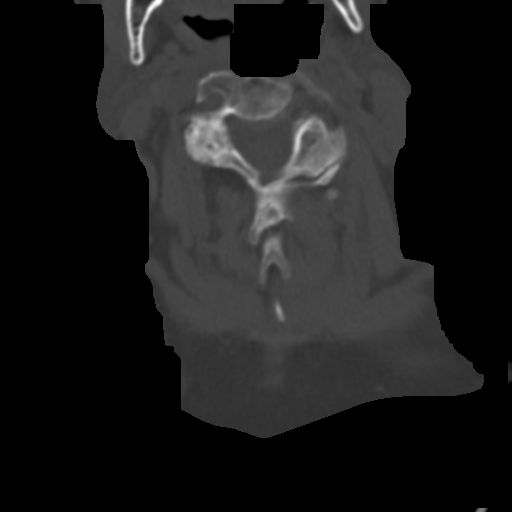
[im 47/68  bone]
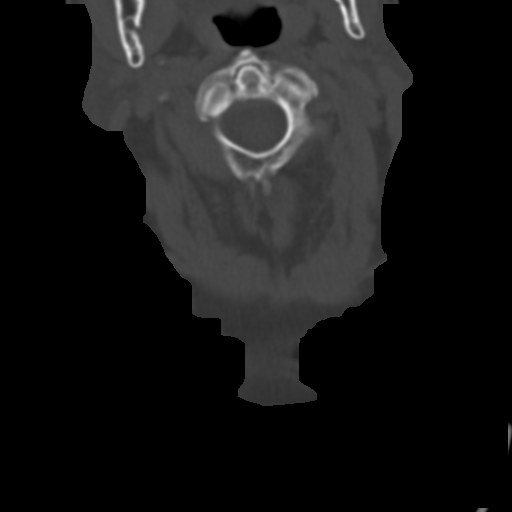
[im 57/68  bone]
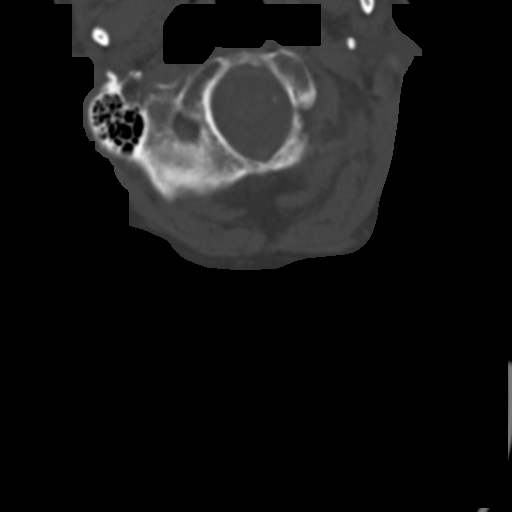
[im 62/68  soft-tissue]
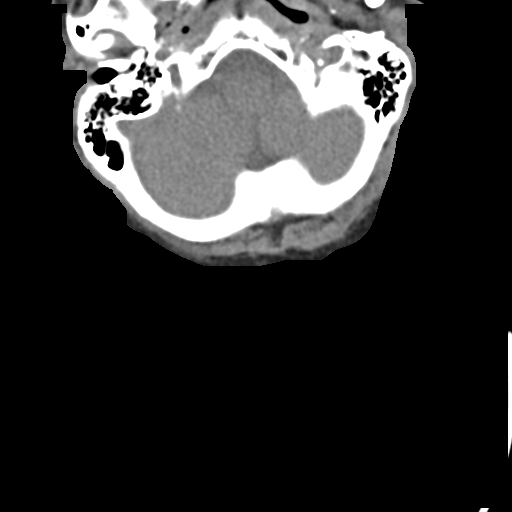
[im 62/68  bone]
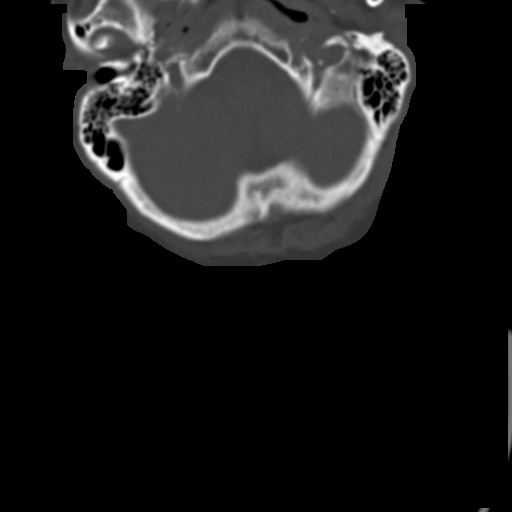

[Series 8: sagittal bone · sagittal · 0.30mm/px · 5 of 78 slices shown, 6 images]
[im 26/78  bone]
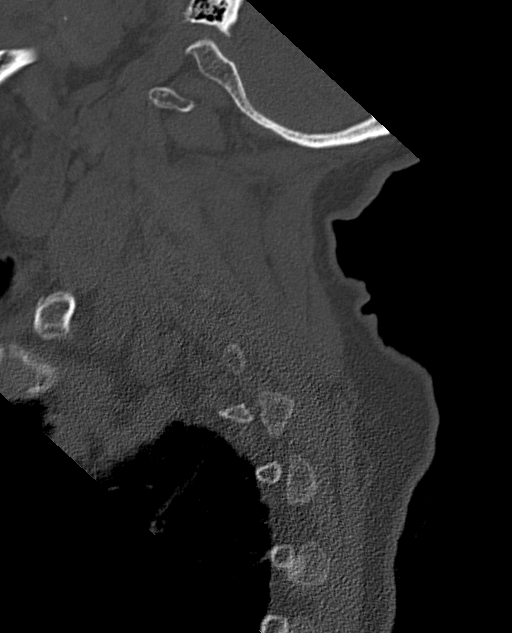
[im 33/78  bone]
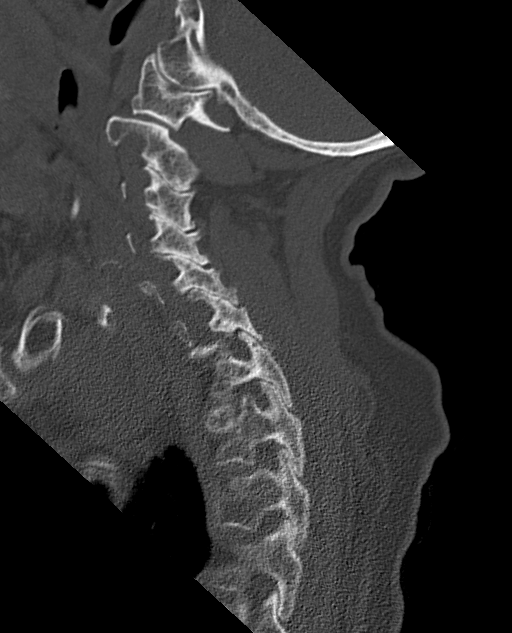
[im 39/78  soft-tissue]
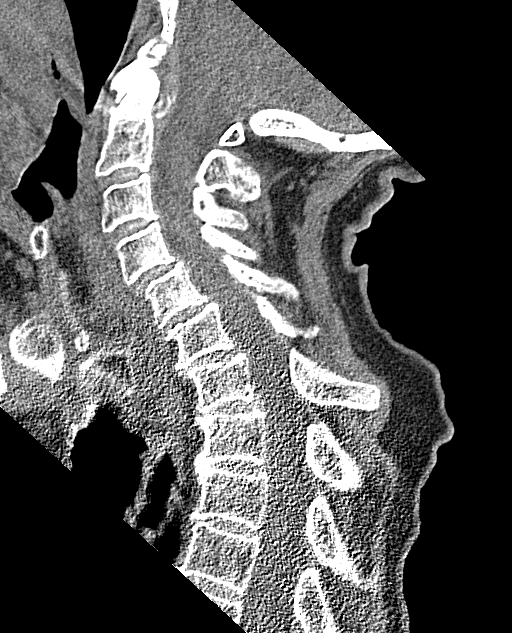
[im 39/78  bone]
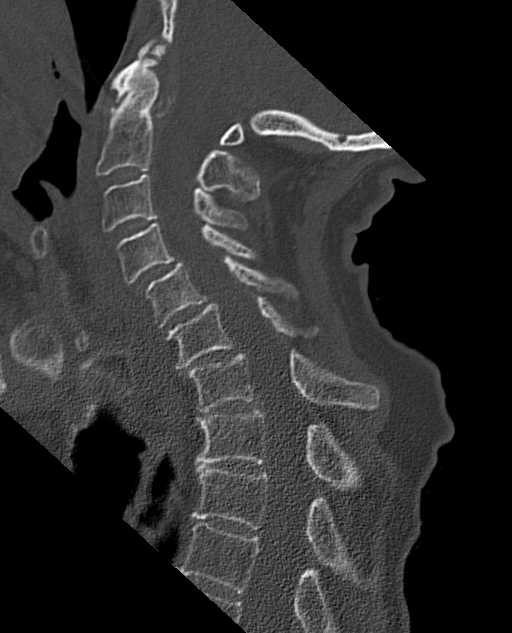
[im 45/78  bone]
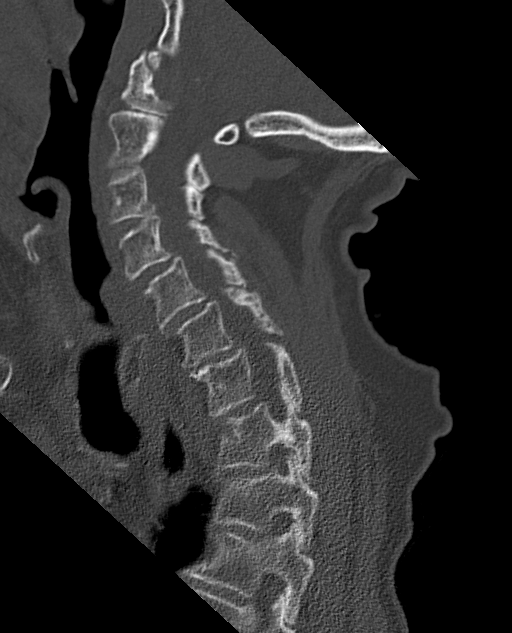
[im 52/78  bone]
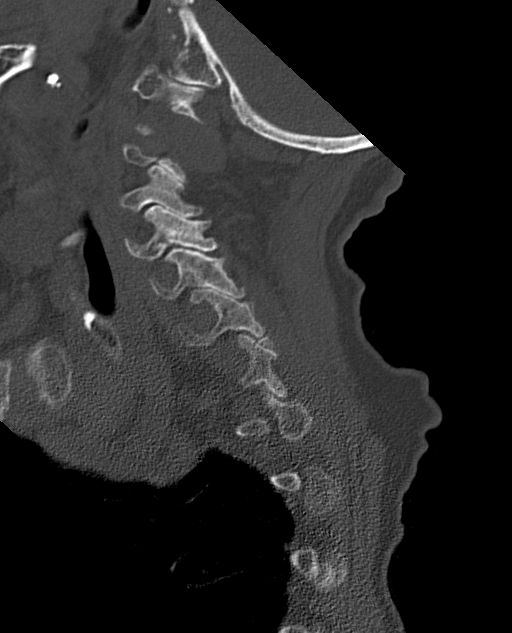

[14 of 29 positions shown; findings below may reference images not displayed]

FINDINGS: CT HEAD FINDINGS

Brain: No evidence of acute infarction, hemorrhage, hydrocephalus,
extra-axial collection or mass lesion/mass effect.

Age related atrophy.

Subcortical white matter and periventricular small vessel ischemic
changes.

Vascular: No hyperdense vessel or unexpected calcification.

Skull: Normal. Negative for fracture or focal lesion.

Sinuses/Orbits: The visualized paranasal sinuses are essentially
clear. The mastoid air cells are unopacified.

Other: None.

CT CERVICAL SPINE FINDINGS

Alignment: Exaggerated upper cervical lordosis.

Skull base and vertebrae: No acute fracture. No primary bone lesion
or focal pathologic process.

Soft tissues and spinal canal: No prevertebral fluid or swelling. No
visible canal hematoma.

Disc levels: Intervertebral disc spaces are maintained. Spinal canal
is patent.

Upper chest: Pertinent right upper lobe lung findings are
incompletely visualized but evaluated on dedicated CT chest.

Other: None.
IMPRESSION: No evidence of acute intracranial abnormality. Atrophy with small
vessel ischemic changes.

No evidence of traumatic injury to the cervical spine.

Pertinent right upper lobe lung findings evaluated on dedicated CT
chest.

## 2022-06-24 IMAGING — CT CT HEAD W/O CM
3 of 4 series · 15 of 47 positions shown, 18 images · non-contrast
Comparison: None Available.

CLINICAL DATA: Multiple falls, dementia, hypertension



[Series 4: head wo · axial · 0.48mm/px · z∈[-155,-20]mm · 9 of 35 slices shown, 12 images]
[im 4/35  brain]
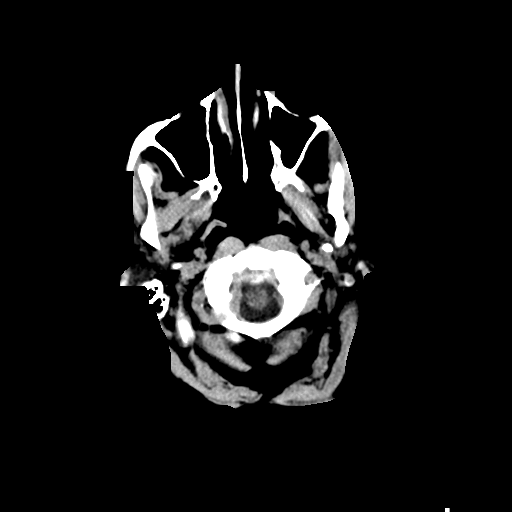
[im 4/35  bone]
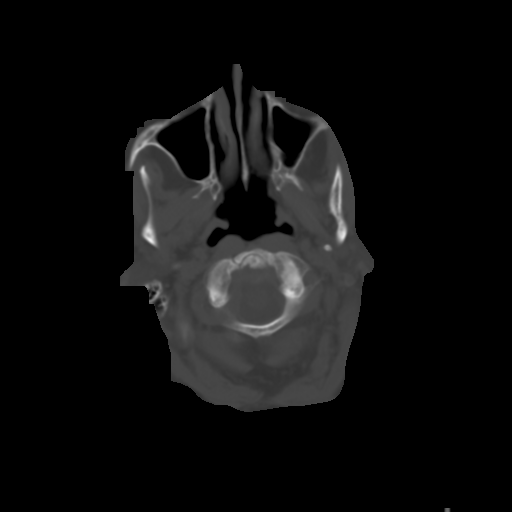
[im 7/35  brain]
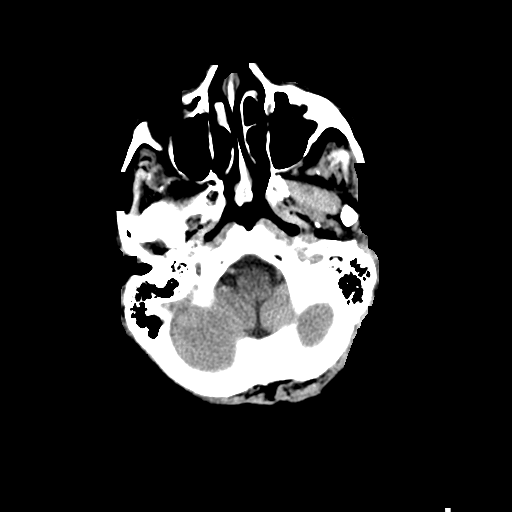
[im 10/35  brain]
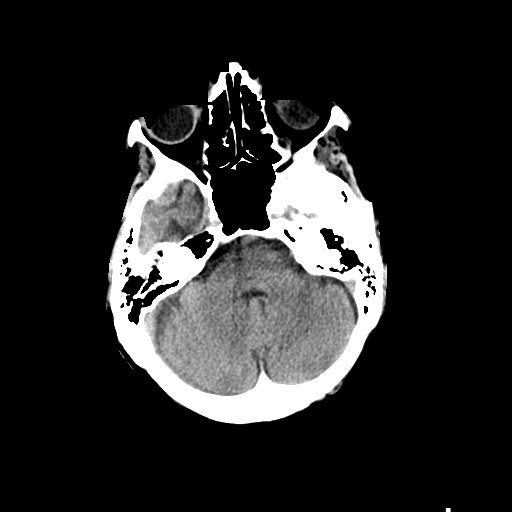
[im 14/35  brain]
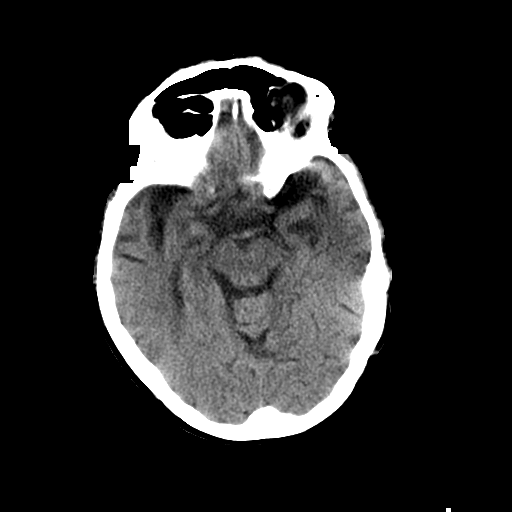
[im 18/35  brain]
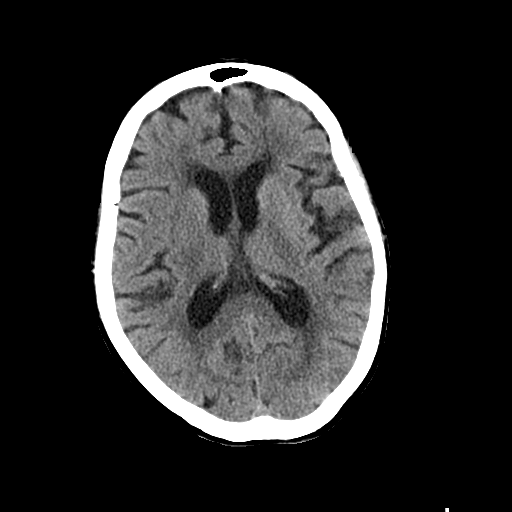
[im 18/35  bone]
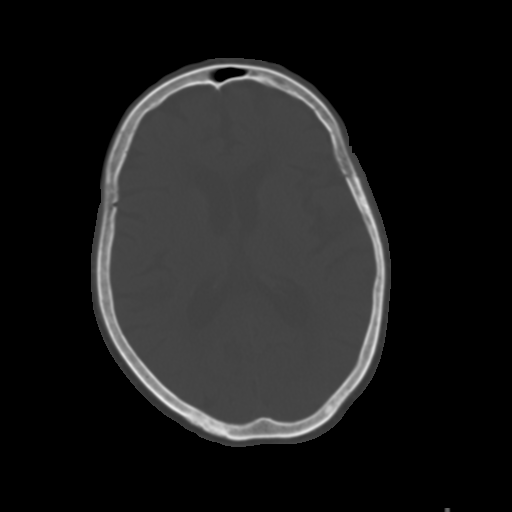
[im 21/35  brain]
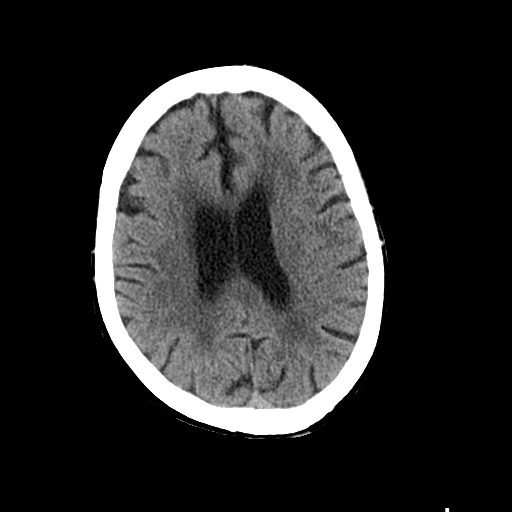
[im 25/35  brain]
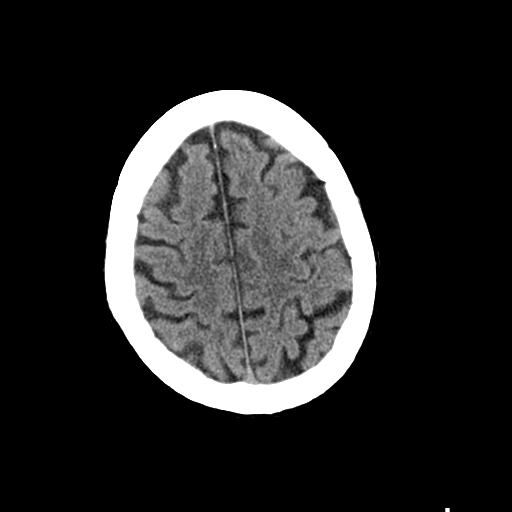
[im 28/35  brain]
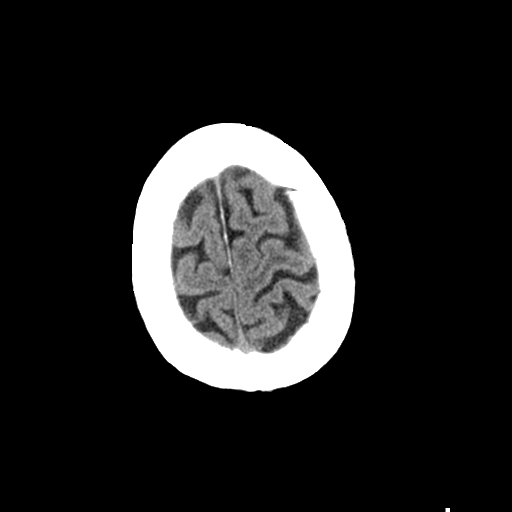
[im 31/35  brain]
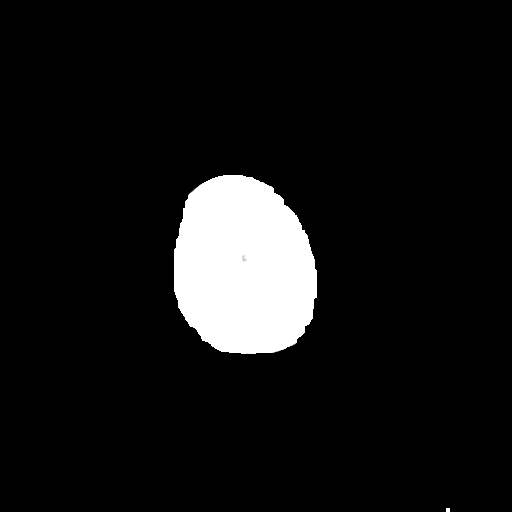
[im 31/35  bone]
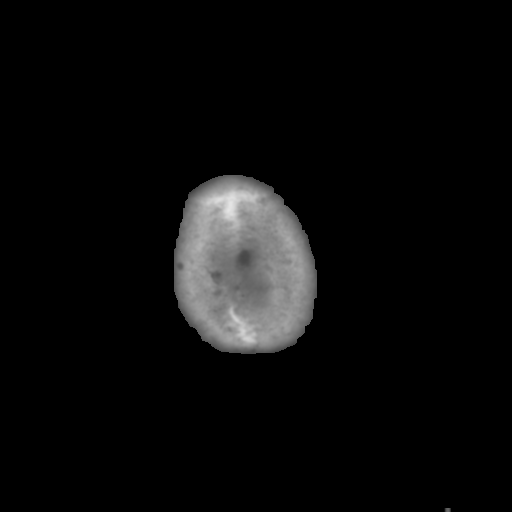

[Series 6: coronal soft tissue · coronal · 0.34mm/px · 3 of 71 slices shown]
[im 24/71  brain]
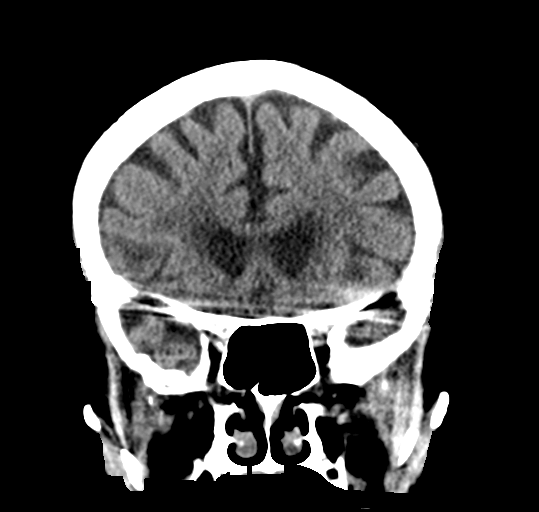
[im 32/71  brain]
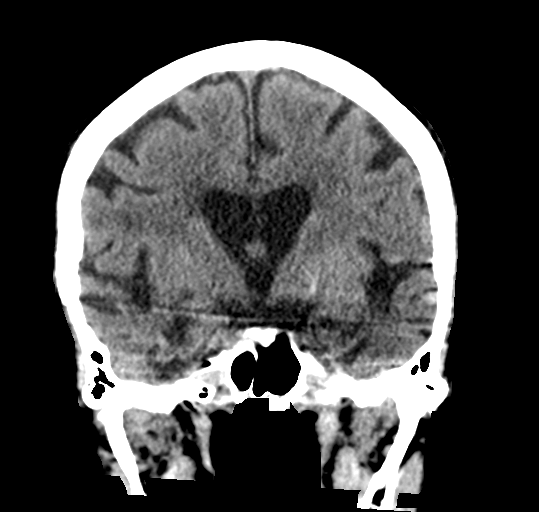
[im 39/71  brain]
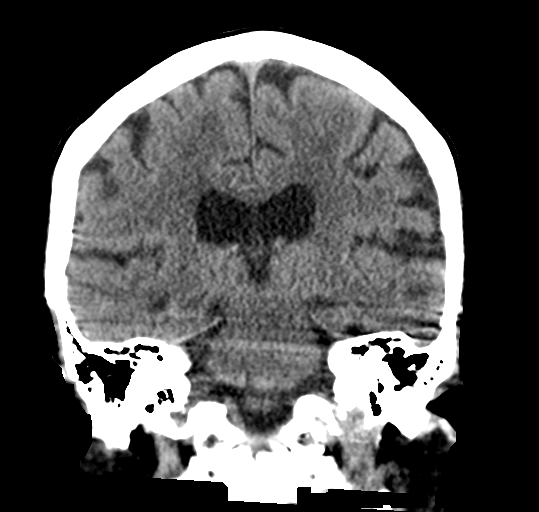

[Series 7: sagittal soft tissue · sagittal · 0.34mm/px · 3 of 58 slices shown]
[im 20/58  brain]
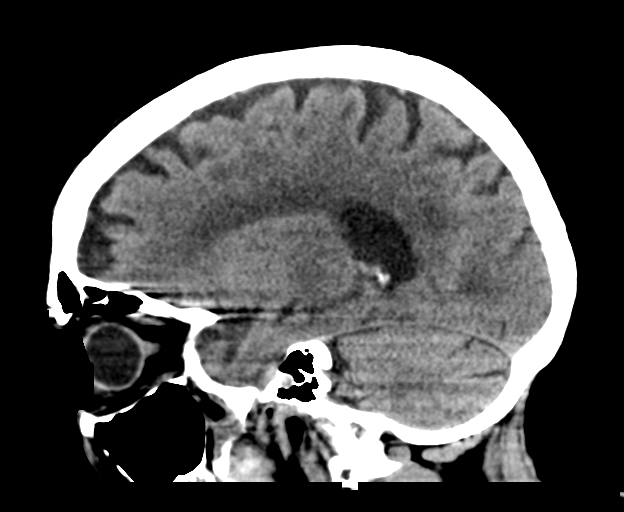
[im 29/58  brain]
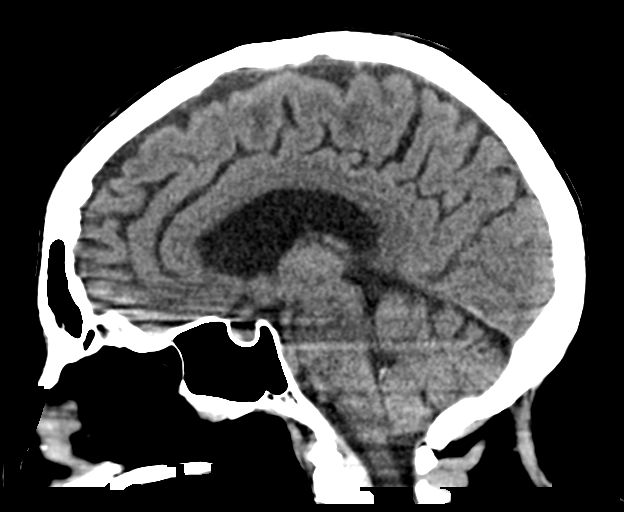
[im 39/58  brain]
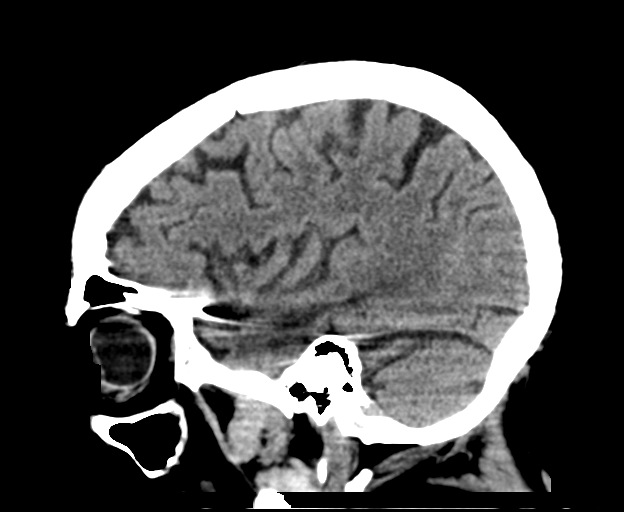

[15 of 47 positions shown; findings below may reference images not displayed]

FINDINGS: CT HEAD FINDINGS

Brain: No evidence of acute infarction, hemorrhage, hydrocephalus,
extra-axial collection or mass lesion/mass effect.

Age related atrophy.

Subcortical white matter and periventricular small vessel ischemic
changes.

Vascular: No hyperdense vessel or unexpected calcification.

Skull: Normal. Negative for fracture or focal lesion.

Sinuses/Orbits: The visualized paranasal sinuses are essentially
clear. The mastoid air cells are unopacified.

Other: None.

CT CERVICAL SPINE FINDINGS

Alignment: Exaggerated upper cervical lordosis.

Skull base and vertebrae: No acute fracture. No primary bone lesion
or focal pathologic process.

Soft tissues and spinal canal: No prevertebral fluid or swelling. No
visible canal hematoma.

Disc levels: Intervertebral disc spaces are maintained. Spinal canal
is patent.

Upper chest: Pertinent right upper lobe lung findings are
incompletely visualized but evaluated on dedicated CT chest.

Other: None.
IMPRESSION: No evidence of acute intracranial abnormality. Atrophy with small
vessel ischemic changes.

No evidence of traumatic injury to the cervical spine.

Pertinent right upper lobe lung findings evaluated on dedicated CT
chest.
